# Patient Record
Sex: Female | Born: 1940 | Race: White | Hispanic: No | Marital: Married | Smoking: Former smoker
Health system: Southern US, Community
[De-identification: ages and names within clinical notes are randomized; demographics above are authoritative.]

## PROBLEM LIST (undated history)

## (undated) DIAGNOSIS — I499 Cardiac arrhythmia, unspecified: Secondary | ICD-10-CM

## (undated) DIAGNOSIS — I10 Essential (primary) hypertension: Secondary | ICD-10-CM

## (undated) DIAGNOSIS — I2510 Atherosclerotic heart disease of native coronary artery without angina pectoris: Secondary | ICD-10-CM

## (undated) DIAGNOSIS — E876 Hypokalemia: Secondary | ICD-10-CM

## (undated) DIAGNOSIS — I639 Cerebral infarction, unspecified: Secondary | ICD-10-CM

## (undated) DIAGNOSIS — I219 Acute myocardial infarction, unspecified: Secondary | ICD-10-CM

## (undated) DIAGNOSIS — E871 Hypo-osmolality and hyponatremia: Secondary | ICD-10-CM

## (undated) DIAGNOSIS — I609 Nontraumatic subarachnoid hemorrhage, unspecified: Secondary | ICD-10-CM

## (undated) HISTORY — DX: Hypokalemia: E87.6

## (undated) HISTORY — DX: Hypo-osmolality and hyponatremia: E87.1

## (undated) HISTORY — DX: Cerebral infarction, unspecified: I63.9

## (undated) HISTORY — DX: Nontraumatic subarachnoid hemorrhage, unspecified: I60.9

---

## 1999-07-09 ENCOUNTER — Encounter: Admission: RE | Admit: 1999-07-09 | Discharge: 1999-07-09 | Payer: Self-pay | Admitting: Emergency Medicine

## 1999-07-09 ENCOUNTER — Encounter: Payer: Self-pay | Admitting: Emergency Medicine

## 1999-11-13 ENCOUNTER — Encounter (INDEPENDENT_AMBULATORY_CARE_PROVIDER_SITE_OTHER): Payer: Self-pay

## 1999-11-13 ENCOUNTER — Inpatient Hospital Stay (HOSPITAL_COMMUNITY): Admission: RE | Admit: 1999-11-13 | Discharge: 1999-11-15 | Payer: Self-pay | Admitting: Obstetrics and Gynecology

## 2000-06-19 ENCOUNTER — Emergency Department (HOSPITAL_COMMUNITY): Admission: EM | Admit: 2000-06-19 | Discharge: 2000-06-19 | Payer: Self-pay | Admitting: Emergency Medicine

## 2000-06-19 ENCOUNTER — Encounter: Payer: Self-pay | Admitting: Emergency Medicine

## 2000-11-18 ENCOUNTER — Encounter: Admission: RE | Admit: 2000-11-18 | Discharge: 2000-11-18 | Payer: Self-pay | Admitting: Emergency Medicine

## 2000-11-18 ENCOUNTER — Encounter: Payer: Self-pay | Admitting: Emergency Medicine

## 2001-10-26 ENCOUNTER — Ambulatory Visit (HOSPITAL_COMMUNITY): Admission: RE | Admit: 2001-10-26 | Discharge: 2001-10-26 | Payer: Self-pay | Admitting: Gastroenterology

## 2001-10-26 ENCOUNTER — Encounter (INDEPENDENT_AMBULATORY_CARE_PROVIDER_SITE_OTHER): Payer: Self-pay | Admitting: Specialist

## 2001-12-09 ENCOUNTER — Encounter: Payer: Self-pay | Admitting: Emergency Medicine

## 2001-12-09 ENCOUNTER — Encounter: Admission: RE | Admit: 2001-12-09 | Discharge: 2001-12-09 | Payer: Self-pay | Admitting: Emergency Medicine

## 2002-07-18 ENCOUNTER — Emergency Department (HOSPITAL_COMMUNITY): Admission: EM | Admit: 2002-07-18 | Discharge: 2002-07-18 | Payer: Self-pay | Admitting: Emergency Medicine

## 2002-09-30 ENCOUNTER — Encounter: Admission: RE | Admit: 2002-09-30 | Discharge: 2002-09-30 | Payer: Self-pay | Admitting: Emergency Medicine

## 2002-09-30 ENCOUNTER — Encounter: Payer: Self-pay | Admitting: Emergency Medicine

## 2003-04-07 ENCOUNTER — Encounter: Admission: RE | Admit: 2003-04-07 | Discharge: 2003-04-07 | Payer: Self-pay | Admitting: Emergency Medicine

## 2003-04-07 ENCOUNTER — Encounter: Payer: Self-pay | Admitting: Emergency Medicine

## 2003-05-04 ENCOUNTER — Encounter: Admission: RE | Admit: 2003-05-04 | Discharge: 2003-05-04 | Payer: Self-pay | Admitting: Neurosurgery

## 2003-05-24 ENCOUNTER — Encounter: Admission: RE | Admit: 2003-05-24 | Discharge: 2003-05-24 | Payer: Self-pay | Admitting: Neurosurgery

## 2003-07-13 ENCOUNTER — Encounter: Admission: RE | Admit: 2003-07-13 | Discharge: 2003-10-11 | Payer: Self-pay | Admitting: Neurosurgery

## 2004-10-04 ENCOUNTER — Encounter: Admission: RE | Admit: 2004-10-04 | Discharge: 2004-10-04 | Payer: Self-pay | Admitting: Emergency Medicine

## 2004-10-09 ENCOUNTER — Emergency Department (HOSPITAL_COMMUNITY): Admission: EM | Admit: 2004-10-09 | Discharge: 2004-10-09 | Payer: Self-pay | Admitting: Family Medicine

## 2005-02-10 ENCOUNTER — Ambulatory Visit (HOSPITAL_COMMUNITY): Admission: RE | Admit: 2005-02-10 | Discharge: 2005-02-10 | Payer: Self-pay | Admitting: Gastroenterology

## 2005-05-16 ENCOUNTER — Encounter (INDEPENDENT_AMBULATORY_CARE_PROVIDER_SITE_OTHER): Payer: Self-pay | Admitting: Specialist

## 2005-05-16 ENCOUNTER — Ambulatory Visit (HOSPITAL_BASED_OUTPATIENT_CLINIC_OR_DEPARTMENT_OTHER): Admission: RE | Admit: 2005-05-16 | Discharge: 2005-05-16 | Payer: Self-pay | Admitting: General Surgery

## 2005-05-16 ENCOUNTER — Ambulatory Visit (HOSPITAL_COMMUNITY): Admission: RE | Admit: 2005-05-16 | Discharge: 2005-05-16 | Payer: Self-pay | Admitting: General Surgery

## 2005-10-06 ENCOUNTER — Encounter: Admission: RE | Admit: 2005-10-06 | Discharge: 2005-10-06 | Payer: Self-pay | Admitting: Emergency Medicine

## 2006-10-12 ENCOUNTER — Encounter: Admission: RE | Admit: 2006-10-12 | Discharge: 2006-10-12 | Payer: Self-pay | Admitting: Emergency Medicine

## 2007-01-04 ENCOUNTER — Encounter: Admission: RE | Admit: 2007-01-04 | Discharge: 2007-01-04 | Payer: Self-pay | Admitting: Emergency Medicine

## 2007-07-29 ENCOUNTER — Ambulatory Visit (HOSPITAL_BASED_OUTPATIENT_CLINIC_OR_DEPARTMENT_OTHER): Admission: RE | Admit: 2007-07-29 | Discharge: 2007-07-29 | Payer: Self-pay | Admitting: Orthopedic Surgery

## 2007-08-26 ENCOUNTER — Ambulatory Visit (HOSPITAL_BASED_OUTPATIENT_CLINIC_OR_DEPARTMENT_OTHER): Admission: RE | Admit: 2007-08-26 | Discharge: 2007-08-26 | Payer: Self-pay | Admitting: Orthopedic Surgery

## 2007-10-13 ENCOUNTER — Encounter: Admission: RE | Admit: 2007-10-13 | Discharge: 2007-10-13 | Payer: Self-pay | Admitting: Emergency Medicine

## 2008-10-11 ENCOUNTER — Inpatient Hospital Stay (HOSPITAL_COMMUNITY): Admission: RE | Admit: 2008-10-11 | Discharge: 2008-10-13 | Payer: Self-pay | Admitting: Orthopedic Surgery

## 2009-01-02 ENCOUNTER — Encounter: Admission: RE | Admit: 2009-01-02 | Discharge: 2009-01-02 | Payer: Self-pay | Admitting: Family Medicine

## 2009-01-24 ENCOUNTER — Emergency Department (HOSPITAL_COMMUNITY): Admission: EM | Admit: 2009-01-24 | Discharge: 2009-01-24 | Payer: Self-pay | Admitting: Emergency Medicine

## 2009-09-28 ENCOUNTER — Encounter: Admission: RE | Admit: 2009-09-28 | Discharge: 2009-09-28 | Payer: Self-pay | Admitting: Family Medicine

## 2010-01-11 ENCOUNTER — Encounter: Admission: RE | Admit: 2010-01-11 | Discharge: 2010-01-11 | Payer: Self-pay | Admitting: Family Medicine

## 2010-09-25 LAB — URINALYSIS, ROUTINE W REFLEX MICROSCOPIC
Glucose, UA: NEGATIVE mg/dL
Hgb urine dipstick: NEGATIVE
Protein, ur: NEGATIVE mg/dL
pH: 6 (ref 5.0–8.0)

## 2010-09-25 LAB — COMPREHENSIVE METABOLIC PANEL
ALT: 48 U/L — ABNORMAL HIGH (ref 0–35)
Albumin: 3.8 g/dL (ref 3.5–5.2)
Alkaline Phosphatase: 85 U/L (ref 39–117)
BUN: 18 mg/dL (ref 6–23)
Chloride: 105 mEq/L (ref 96–112)
Glucose, Bld: 72 mg/dL (ref 70–99)
Potassium: 3.7 mEq/L (ref 3.5–5.1)
Total Bilirubin: 0.9 mg/dL (ref 0.3–1.2)

## 2010-09-25 LAB — BASIC METABOLIC PANEL
BUN: 10 mg/dL (ref 6–23)
BUN: 14 mg/dL (ref 6–23)
CO2: 30 mEq/L (ref 19–32)
Calcium: 8.4 mg/dL (ref 8.4–10.5)
Calcium: 8.5 mg/dL (ref 8.4–10.5)
Creatinine, Ser: 0.7 mg/dL (ref 0.4–1.2)
Creatinine, Ser: 0.75 mg/dL (ref 0.4–1.2)
GFR calc non Af Amer: >60 mL/min (ref >60)
GFR calc non Af Amer: >60 mL/min (ref >60)
Glucose, Bld: 151 mg/dL — ABNORMAL HIGH (ref 70–99)
Sodium: 137 mEq/L (ref 135–145)

## 2010-09-25 LAB — PROTIME-INR
INR: 1.2 (ref 0.00–1.49)
Prothrombin Time: 12.8 seconds (ref 11.6–15.2)
Prothrombin Time: 15.3 seconds — ABNORMAL HIGH (ref 11.6–15.2)
Prothrombin Time: 17.5 seconds — ABNORMAL HIGH (ref 11.6–15.2)

## 2010-09-25 LAB — CBC
HCT: 31.2 % — ABNORMAL LOW (ref 36.0–46.0)
Hemoglobin: 14.2 g/dL (ref 12.0–15.0)
MCHC: 34.5 g/dL (ref 30.0–36.0)
MCV: 90.2 fL (ref 78.0–100.0)
Platelets: 205 10*3/uL (ref 150–400)
Platelets: 212 10*3/uL (ref 150–400)
RBC: 3.51 MIL/uL — ABNORMAL LOW (ref 3.87–5.11)
RBC: 4.61 MIL/uL (ref 3.87–5.11)
RDW: 12.8 % (ref 11.5–15.5)
RDW: 12.9 % (ref 11.5–15.5)
WBC: 6.7 10*3/uL (ref 4.0–10.5)
WBC: 7.6 10*3/uL (ref 4.0–10.5)

## 2010-09-25 LAB — URINE MICROSCOPIC-ADD ON

## 2010-09-25 LAB — TYPE AND SCREEN
ABO/RH(D): O POS
Antibody Screen: NEGATIVE

## 2010-10-29 NOTE — Op Note (Signed)
Tammy Navarro, Tammy Navarro             ACCOUNT NO.:  [id number]   MEDICAL RECORD NO.:  [id number]          PATIENT TYPE:  INP   LOCATION:  5008                         FACILITY:  [facility]   PHYSICIAN:  Loreta Ave, M.D. DATE OF BIRTH:  13-Jan-1941   DATE OF PROCEDURE:  10/11/2008  DATE OF DISCHARGE:                               OPERATIVE REPORT   PREOPERATIVE DIAGNOSES:  End-stage degenerative arthritis left knee.  Varus alignment.  Bone deficiency lateral femoral condyle.   POSTOPERATIVE DIAGNOSES:  End-stage degenerative arthritis left knee.  Varus alignment.  Bone deficiency lateral femoral condyle.   PROCEDURE:  Left total knee replacement modified minimally invasive  approach.  Stryker Triathlon prosthesis.  Soft tissue balancing with  lateral capsule release.  Cemented pegged posterior stabilized #5  femoral component.  Cemented #5 tibial component 9-mm polyethylene  insert.  Cemented pegged medial offset resurfacing 35-mm patellar  component.   SURGEON:  Loreta Ave, MD   ASSISTANT:  Zonia Kief, PA was present throughout the entire case  necessary for timely completion of the procedure.   ANESTHESIA:  General.   BLOOD LOSS:  Minimal.   TOURNIQUET TIME:  One hour and 15 minutes.   SPECIMENS:  None.   CULTURES:  None.   COMPLICATIONS:  None.   DRESSINGS:  Soft compressive with knee immobilizer.   DRAIN:  Hemovac x1.   PROCEDURE:  The patient was brought to the operating room and after  adequate anesthesia have been obtained, the knee examined.  Valgus about  10 degrees a little bit correctable.  Almost full extension and flexion  at 100 degrees.  Stable ligaments.  Tourniquet applied, prepped and  draped in the usual sterile fashion.  Exsanguinated with elevation of  Esmarch, tourniquet inflated to 350 mmHg.  Straight incision above the  patella down to tibial tubercle.  Medial arthrotomy extending into a  vastus splitting incision preserving quad tendon.   Knee exposed.  Grade  4 change throughout.  Bony deficiency especially distal and lateral  femoral condyle.  Remnants of menisci and cruciate ligaments one large  and numerous small loose bodies removed.  Intramedullary guide on the  femur.  Distal cut 10 mm set at 5 degrees of valgus.  Using epicondylar  axis and removal of spurs femur was sized, cut and fitted for a  posterior stabilized #5 femoral component.  Proximal tibia exposed.  Extramedullary guide.  3 degree posterior slope cut.  Sufficient  resection for a 9-mm insert.  Size #5 component.  Lateral capsule  released to be able to balance the knee.  I did not release the  iliotibial band or the lateral retinaculum.  Patella exposed.  Size cut  and fitted for a 35-mm component.  Trials put in place #5 above and  below.  With a 9-mm insert and a 35-mm patellar component and soft  tissue balancing, full extension, full flexion, good alignment, good  stability and good patellofemoral tracking.  Tibia was marked for  appropriate rotation with trials and then hand reamed.  All trials have  been removed.  Copious irrigation with a pulse irrigating  device.  Cement prepared and placed on all components which were firmly seated.  Polyethylene attached to the tibia.  Knee reduced.  Patella held with a  clamp.  Once the cement hardened it was reexamined.  Had good alignment,  stability and tracking.  Wound irrigated.  Hemovac placed through a  separate stab wound and brought out superolaterally.  Arthrotomy closed  with #1 Vicryl, skin and subcutaneous tissue with Vicryl and staples.  Knee injected with Marcaine.  Hemovac clamp.  Sterile compressive  dressing applied.  Tourniquet deflated and removed.  Knee immobilizer  applied.  Anesthesia reversed.  Brought to the recovery room.  Tolerated  the surgery well.  No complications.      Loreta Ave, M.D.  Electronically Signed     DFM/MEDQ  D:  10/12/2008  T:  10/13/2008  Job:   [id number]

## 2010-10-29 NOTE — Op Note (Signed)
Tammy Navarro, Tammy Navarro             ACCOUNT NO.:  [id number]   MEDICAL RECORD NO.:  [id number]          PATIENT TYPE:  AMB   LOCATION:  DSC                          FACILITY:  [facility]   PHYSICIAN:  Loreta Ave, M.D. DATE OF BIRTH:  03-Apr-1941   DATE OF PROCEDURE:  08/26/2007  DATE OF DISCHARGE:                               OPERATIVE REPORT   PREOPERATIVE DIAGNOSIS:  Left carpal tunnel syndrome.   POSTOPERATIVE DIAGNOSIS:  Left carpal tunnel syndrome.   PROCEDURE:  Left carpal tunnel release.   SURGEON:  Loreta Ave, M.D.   ANESTHESIA:  General.   BLOOD LOSS:  Minimal.   TOURNIQUET TIME:  35 minutes.   SPECIMENS:  None.   COMPLICATIONS:  None.   DRESSING:  Soft compressive with a bulky hand dressing and splint.   DESCRIPTION OF PROCEDURE:  The patient was brought to the operating room  and placed on the operating table in a supine position.  After adequate  anesthesia had been obtained, tourniquet applied, prepped and draped in  the usual sterile fashion.  Exsanguinated with elevation of Esmarch and  tourniquet inflated to 250 mmHg.  A small incision over the volar aspect  of the wrist curving slightly ulnarward at the wrist crease.  The skin  and subcutaneous tissues divided.  Retinaculum over the carpal tunnel  identified and incised in its entirety under direct visualization from  the forearm fascia proximally to the palmar arch distally.  Moderate to  marked constriction on the nerve.  Improved after carpal tunnel release  aponeurotomy.  Distal wrist palmar branch identified, protected, and  decompressed.  Wound irrigated.  Skin closed with mattress nylon suture.  Margins were injected with Marcaine.  Sterile compressive dressing and a  splint applied.  Tourniquet deflated and removed.  Anesthesia reversed.  Brought to the recovery room. Tolerated surgery with no complications.      Loreta Ave, M.D.  Electronically Signed     DFM/MEDQ  D:   08/26/2007  T:  08/26/2007  Job:  [id number]

## 2010-10-29 NOTE — Op Note (Signed)
NAMELEATHER, Tammy Navarro             ACCOUNT NO.:  [id number]   MEDICAL RECORD NO.:  [id number]          PATIENT TYPE:  AMB   LOCATION:  DSC                          FACILITY:  [facility]   PHYSICIAN:  Loreta Ave, M.D. DATE OF BIRTH:  [birth year]   DATE OF PROCEDURE:  07/29/2007  DATE OF DISCHARGE:  07/29/2007                               OPERATIVE REPORT   PREOPERATIVE DIAGNOSIS:  Right carpal tunnel syndrome.   POSTOPERATIVE DIAGNOSIS:  Right carpal tunnel syndrome.   PROCEDURE:  Right carpal tunnel release.   SURGEON:  Loreta Ave, M.D.   ASSISTANT:  Zonia Kief, P.A.   ANESTHESIA:  General.   BLOOD LOSS:  Minimal.   SPECIMENS:  None.   CULTURES:  None.   COMPLICATIONS:  None.   PROCEDURE:  Soft compressive bulky dressing splint.   PROCEDURE:  The patient brought to the operating room, and after  adequate anesthesia had been obtained, tourniquet applied, prepped and  draped in the usual sterile fashion.  Exsanguination with elevation of  Esmarch, tourniquet inflated to 250 mmHg.  Total tourniquet time was 30  minutes.  A small incision made over the volar aspect of the carpal  tunnel.  Skin and subcutaneous tissues divided.  Retinaculum over the  carpal tunnel carefully divided from the forearm fascia proximally to  the palmar arch distally under direct visualization.  Moderate to marked  hourglass constriction of the nerve improved after carpal tunnel release  and aponeurotomy but was still present.  There were two motor branches,  one more proximal than usual, the other one more distal in the usual  position.  Both were identified, decompressed.  Digital branch, motor  branches identified, decompressed.  Entire  carpal tunnel inspected.  No other findings appreciated.  Wound  irrigated.  Skin closed with interrupted mattress sutures.  Sterile  compressive dressing applied.  Splint applied.  Tourniquet deflated and  removed.  Anesthesia reversed.  Brought to  the recovery room.  Tolerated  the surgery well.  No complications.      Loreta Ave, M.D.  Electronically Signed     DFM/MEDQ  D:  07/29/2007  T:  07/30/2007  Job:  [id number]

## 2010-11-01 NOTE — Op Note (Signed)
[facility]  Patient:    Tammy Navarro, Tammy Navarro                    MRN: [id number] Proc. Date: 11/13/99 Adm. Date:  [id number] Attending:  Cordelia Pen Ii                           Operative Report  PREOPERATIVE DIAGNOSIS:       Pelvic relaxation.  POSTOPERATIVE DIAGNOSIS:      Pelvic relaxation.  OPERATION:                    Total vaginal hysterectomy with anterior and posterior vaginal repair.  SURGEON:                      Guy Sandifer. Arleta Creek, M.D.  ASSISTANTWilley Blade, M.D.  ANESTHESIA:                   General with endotracheal intubation by J. Leilani Able, Montez Hageman., M.D.  ESTIMATED BLOOD LOSS:         350 cc.  INDICATIONS:                  This patient is a 70 year old married white female, gravida 4, para 4, with increasing symptoms of pelvic prolapse.  Details are dictated in the history and physical.  Total vaginal hysterectomy, bilateral salpingo-oophorectomy if possible, and anterior and posterior vaginal repair are discussed with the patient.  Possible risks and complications are discussed including, but not limited to infection, bowel, bladder, or ureteral damage, bleeding requiring transfusion of blood products with possible transfusion reaction, HIV and hepatitis acquisition, DVT, PE, pneumonia, fistula formation, and the possibility of laparotomy.  All questions were answered, and consent is signed and on the chart.  DESCRIPTION OF PROCEDURE:     The patient is taken to the operating room and placed in the dorsal supine position where general anesthesia is induced via endotracheal intubation.  She is then placed in the dorsal lithotomy position with care being taken not to overextend her hips.  She was prepped, the bladder was straight catheterized, and she is draped in a sterile fashion.  The weighted speculum was placed and the posterior cul-de-sac is entered sharply without  difficulty. There were multiple filmy adhesions in the posterior cul-de-sac which come down easily with the dissecting finger.  The cul-de-sac can then be directly observed and is seen to be totally free and clear.  The cervix is then circumscribed with a scalpel and the mucosa is advanced sharply and bluntly.  The uterosacral ligaments are taken bilaterally and are ligated with 0 Monocryl sutures with a transfixion type suture.  Cardinal ligaments are then taken bilaterally followed by the bladder pillars.  At this point, a hand can be placed around the fundus to mark the vesicouterine peritoneum which is bluntly perforated without difficulty.  The fundus is then delivered posteriorly and the proximal ligaments are clamped and the specimen is cut free.  Careful inspection reveals the ovaries to be adherent up  high in the pelvis.  Because they are so densely adherent, it is decided to leave them in place because they cannot be adequately exposed and because of the risk of bleeding.  The uterosacral ligaments are then plicated to  the vaginal mucosa bilaterally.  The uterosacral ligaments are then plicated with two separate sutures of 0 Monocryl in the midline.  The cuff is then closed posteriorly with figure-of-eight sutures.  Anterior vaginal repair is then carried out by dissecting the anterior vaginal mucosa from the underlying bladder sharply in the midline.  This is then carried bilaterally sharply and bluntly to a point approximately 1 cm below the urethral meatus.  Three Kelly plication sutures are then placed.  The  base of the bladder is then reduced with a pursestring suture.  The vesicovaginal fascia is then reapproximated in the midline with interrupted sutures which gives good support.  Excess vaginal mucosa is trimmed.  The vaginal cuff is then closed with interrupted figure-of-eight sutures.  The anterior vaginal mucosa is closed in a running fashion with 2-0  running locking suture of 0 Monocryl.  Posterior vaginal repair is then carried out by removing a diamond-shaped wedge of mucosa from the posterior forchette.  The posterior mucosa is then sharply dissected in the midline from the underlying rectum.  This is then carried out bilaterally sharply and bluntly.  The rectovaginal fascia is then reapproximated in the midline with interrupted sutures.  Excess mucosa is trimmed and the posterior mucosa is closed in a running locking fashion with 2-0 Monocryl.  The posterior perineal body is  then dissected out and interrupted sutures of 0 Monocryl are used to do a perineorrhaphy and to reapproximate the perineal body.  The running suture on the posterior vaginal mucosa is then carried on down in an episiotomy type fashion o close up the entire incision.  Foley catheter is placed in the bladder and approximately 100 cc of clear urine is obtained.  This is then filled retrograde through the Foley with approximately 420 cc of saline.  A suprapubic Bonanno catheter is placed on the first attempt without difficulty.  Good aspiration of  saline is noted with and without a stylet in place.  The bladder is drained while the Bonanno catheter is sutured in place with Ethilon suture.  The vagina is then packed with a one-inch Iodoform gauze.  All counts are correct.  The patient is  awakened and taken to the recovery room in stable condition. DD:  11/13/99 TD:  11/13/99 Job: [id number]

## 2010-11-01 NOTE — Discharge Summary (Signed)
[facility]  Patient:    Tammy Navarro, Tammy Navarro                    MRN: [id number] Adm. Date:  [id number] Disc. Date: [id number] Attending:  Cordelia Pen Ii                           Discharge Summary  ADMISSION DIAGNOSIS:          Pelvic prolapse.  DISCHARGE DIAGNOSIS:          Pelvic prolapse.  PROCEDURE:                    On Nov 13, 1999, total vaginal hysterectomy with anterior and posterior vaginal repair.  REASON FOR ADMISSION:         The patient is a 70 year old married white female, gravida 4, para 4, with progressive symptoms of prolapse and incontinence. Details are dictated in the history and physical.  HOSPITAL COURSE:              The patient undergoes the above procedure without  difficulty.  Estimated blood loss is 350 cc.  On the evening of surgery, she has good pain relief with mild nausea relieved with Phenergan.  She remains afebrile with stable vital signs with clear urine output.  The following morning, she does not pass flatus, but is ambulating well.  She remains afebrile.  Hemoglobin is 11.4.  Vaginal pack was removed on the first postoperative day.  On the day of discharge, she is feeling good, tolerating a regular diet, and ambulating. Residual urines are 0 to 25 cc.  Suprapubic catheter is removed intact without difficulty.  CONDITION ON DISCHARGE:       Good.  DIET:                         Regular as tolerated.  ACTIVITY:                     No lifting, no operation of automobiles, no vaginal entry.  She is to call for problems including, but not limited to, heavy vaginal bleeding, temperature of 100 degrees or greater, persistent nausea and vomiting, or increasing pain.  DISCHARGE MEDICATIONS:        1. Tylox #30 one to two p.o. q.6h. p.r.n.                               2. Ibuprofen 600 mg p.o. q.6h. p.r.n.                               3. Multivitamin daily.  FOLLOW-UP:                    In the  office in two to three weeks. DD:  11/15/99 TD:  11/19/99 Job: [id number]

## 2010-11-01 NOTE — Procedures (Signed)
[place]  Patient:    GUERLINE, HAPP Visit Number: [id number] MRN: [id number]          Service Type: END Location: ENDO Attending Physician:  Charna Elizabeth Dictated by:   Anselmo Rod, M.D. Proc. Date: 10/26/01 Admit Date:  10/26/2001   CC:         Reuben Likes, M.D.   Procedure Report  DATE OF BIRTH:  07/22/1940  PROCEDURE PERFORMED:  Colonoscopy with hot biopsies x3 and snare polypectomy x1.  ENDOSCOPIST:  Anselmo Rod, M.D.  INSTRUMENT USED:  Olympus colonoscope.  INDICATIONS FOR PROCEDURE:  Polyp seen on flexible sigmoidoscopy in a 70 year old white female, rule out other colonic polyps, polypectomy planned.  PREPROCEDURE PREPARATION:  Informed consent was procured from the patient. The patient was fasted for eight hours prior to the procedure, and prepped with a bottle of magnesium of citrate and a gallon of NuLytely the night prior to the procedure.  PREPROCEDURE PHYSICAL:  VITAL SIGNS:  The patient had stable vital signs.  NECK:  Supple.  CHEST:  Clear to auscultation.  CARDIAC:  S1 and S2 is regular.  ABDOMEN:  Soft with normal bowel sounds.  DESCRIPTION OF THE PROCEDURE:   The patient was placed in the left lateral decubitus position and sedated with 50 mg of Demerol and 7 mg of Versed intravenously.  Once the patient was adequately sedated and maintained on low-flow oxygen and continuous cardiac monitoring, the Olympus videocolonoscope was advanced from the rectum to the cecum without difficulty. The patient had some left-sided diverticulosis.  Two small polyps were hot biopsied from the rectosigmoid area.  One polyp was snared from 10 cm.  The patient tolerated the procedure well without complications.  The appendiceal orifice and the ileocecal valve were clearly visualized and photographed.  IMPRESSION: 1. Left-sided diverticulosis. 2. Three polyps in the rectum and rectosigmoid removed as described   above. 3. No masses or other abnormalities seen in the cecum, right colon, or    transverse colon.  RECOMMENDATIONS: 1. Await pathology results. 2. Outpatient followup in the next two weeks. 3. Avoid all nonsteroidals including aspirin for the next 2-3 weeks. 4. High fiber diet. 5. Further recommendations made on followup. Dictated by:   Anselmo Rod, M.D. Attending Physician:  Charna Elizabeth DD:  03/28/02 TD:  10/27/01 Job: [id number]

## 2010-11-01 NOTE — Op Note (Signed)
[name], [name]             ACCOUNT NO.:  [id number]   MEDICAL RECORD NO.:  [id number]          PATIENT TYPE:  AMB   LOCATION:  DSC                          FACILITY:  [facility]   PHYSICIAN:  Leonie Man, M.D.   DATE OF BIRTH:  [birth year]   DATE OF PROCEDURE:  05/16/2005  DATE OF DISCHARGE:                                 OPERATIVE REPORT   PREOPERATIVE DIAGNOSIS:  Cyst of right parietal scalp.   POSTOPERATIVE DIAGNOSIS:  Cyst of right parietal scalp.   PROCEDURE:  Excision of cyst from right parietal scalp.   SURGEON:  Leonie Man, M.D.   ASSISTANT:  OR tech.   ANESTHESIA:  MAC.   SPECIMENS TO LAB:  1.  Cyst, right parietal scalp anterior.  2.  Cyst, right parietal scalp posterior.   ESTIMATED BLOOD LOSS:  Minimal.   COMPLICATIONS:  There are no complications.   DISPOSITION:  The patient returned to the PACU in excellent condition.   BRIEF CLINICAL NOTE:  [name] is a 70 year old lady with an enlarging  cyst of her scalp. 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  She comes to the operating room for excision of these, as they have become  bigger and causing significant amount of discomfort, particularly when she  comes her hair. The risks and potential benefits of surgery have been  discussed. All questions answered, consent obtained.   PROCEDURE:  Following satisfactory sedation with the patient positioned  supinely, the head turned to the right lateral position. The larger of the 2  cysts was approached after prepping and draping, and infiltrated with 1%  lidocaine with epinephrine 1:100,000. I made an elliptical incision over the  larger of the 2 cysts, taking this through the skin and down to the cyst  capsule.  The cyst was then dissected free in its entirety and removed and  forwarded for pathologic evaluation. Hemostasis was secured with  electrocautery, and the wound was then closed with interrupted sutures of 3-  0 nylon.   Attention was then turned to the posterior cyst, which was also infiltrated  with 1% lidocaine with epinephrine 1:100,000. Again, an elliptical incision  was made over the incision; deepened through the skin and subcutaneous  tissues.  The excision was carried out with a wide excision around the  entire wall of the cyst, carrying it all the way down to the calvarium. This  is removed and forwarded for pathologic evaluation.  Hemostasis was assured  with  electrocautery.  The wound was then closed with interrupted 3-0 nylon  sutures. Both wounds were then reinforced with Dermabond. The patient was  then removed from the operating room to the recovery room in stable  condition. She tolerated the procedure well.      Leonie Man, M.D.  Electronically Signed     PB/MEDQ  D:  05/16/2005  T:  05/17/2005  Job:  [id number]

## 2010-11-01 NOTE — Op Note (Signed)
[name], [name]             ACCOUNT NO.:  [id number]   MEDICAL RECORD NO.:  [id number]          PATIENT TYPE:  AMB   LOCATION:  ENDO                         FACILITY:  [facility]   PHYSICIAN:  Anselmo Rod, M.D.  DATE OF BIRTH:  10/19/1940   DATE OF PROCEDURE:  02/10/2005  DATE OF DISCHARGE:                                 OPERATIVE REPORT   PROCEDURE PERFORMED:  Screening colonoscopy.   ENDOSCOPIST:  Tammy Navarro, M.D.   INSTRUMENT USED:  Olympus video colonoscope.   INDICATION FOR PROCEDURE:  A 70 year old white female with a history of  adenomatous polyps removed in the past undergoing a repeat colonoscopy to  rule out recurrent polyps, masses, etc.   PREPROCEDURE PREPARATION:  Informed consent was procured from the patient.  The patient fasted for 8 hours prior to the procedure and prepped with a  bottle of magnesium citrate and a gallon of Go-LYTELY the night prior to the  procedure.  The risks and benefits of the procedure, including a 10% miss  rate of cancer and polyps was discussed with her as well.   PREPROCEDURE PHYSICAL:  Patient had stable vital signs.  NECK:  Supple.  CHEST:  Clear to auscultation.  S1, S2 regular.  ABDOMEN:  Soft with normal bowel sounds.   DESCRIPTION OF THE PROCEDURE:  The patient was placed in the left lateral  decubitus position, sedated with 75 mg of Demerol and 7.5 mg of Versed in  slow incremental doses.  Once the patient was adequately sedated, maintained  on low flow oxygen, continuous cardiac monitoring.  The Olympus video  colonoscope was advanced from the rectum to the cecum.  The appendicular  orifice and ileocecal valve were visualized and photographed.  There was  some residual in the colon.  Multiple washings were done.  There was  evidence of sigmoid diverticulosis.  No masses, polyps, erosions or  ulcerations were seen.  Small internal hemorrhoids were appreciated on  retroflexion in the rectum.  The patient tolerated the  procedure well  without complications.   IMPRESSION:  1.  Scattered sigmoid diverticulosis.  2. Small internal hemorrhoids.  3. No      masses or polyps seen.   RECOMMENDATIONS:  1.  Continue on high fiber diet with liberal fluid intake.  2.  Brochures on diverticulosis given to the patient for education.  3.  Repeat colonoscopy in the next 5 years, unless the patient develops any      abnormal symptoms in the interim.  4.  Outpatient followup as need arises in the future.      Anselmo Rod, M.D.  Electronically Signed     JNM/MEDQ  D:  02/10/2005  T:  02/10/2005  Job:  [id number]   cc:   Reuben Likes, M.D.  [address]  [place]  [place] [address]  Fax: [phone]

## 2010-12-26 ENCOUNTER — Other Ambulatory Visit: Payer: Self-pay | Admitting: Internal Medicine

## 2010-12-26 DIAGNOSIS — Z1231 Encounter for screening mammogram for malignant neoplasm of breast: Secondary | ICD-10-CM

## 2011-01-14 ENCOUNTER — Ambulatory Visit
Admission: RE | Admit: 2011-01-14 | Discharge: 2011-01-14 | Disposition: A | Discharge status: Home / Self Care | Payer: Medicare Other | Source: Ambulatory Visit | Attending: Internal Medicine | Admitting: Internal Medicine

## 2011-01-14 DIAGNOSIS — Z1231 Encounter for screening mammogram for malignant neoplasm of breast: Secondary | ICD-10-CM

## 2011-03-07 LAB — BASIC METABOLIC PANEL
CO2: 27
GFR calc non Af Amer: >60
Glucose, Bld: 138 — ABNORMAL HIGH
Potassium: 4.2
Sodium: 138

## 2011-03-10 LAB — BASIC METABOLIC PANEL
BUN: 15
CO2: 27
Calcium: 9
Chloride: 107
Creatinine, Ser: 0.91
GFR calc Af Amer: >60
GFR calc non Af Amer: >60
Glucose, Bld: 99
Potassium: 3.9
Sodium: 139

## 2011-03-10 LAB — POCT HEMOGLOBIN-HEMACUE: Hemoglobin: 13.5

## 2011-09-25 ENCOUNTER — Emergency Department (HOSPITAL_COMMUNITY): Payer: Medicare Other

## 2011-09-25 ENCOUNTER — Emergency Department (HOSPITAL_COMMUNITY)
Admission: EM | Admit: 2011-09-25 | Discharge: 2011-09-25 | Disposition: A | Discharge status: Home / Self Care | Payer: Medicare Other | Attending: Emergency Medicine | Admitting: Emergency Medicine

## 2011-09-25 ENCOUNTER — Encounter (HOSPITAL_COMMUNITY): Payer: Self-pay | Admitting: Emergency Medicine

## 2011-09-25 DIAGNOSIS — K5792 Diverticulitis of intestine, part unspecified, without perforation or abscess without bleeding: Secondary | ICD-10-CM

## 2011-09-25 DIAGNOSIS — Z79899 Other long term (current) drug therapy: Secondary | ICD-10-CM | POA: Insufficient documentation

## 2011-09-25 DIAGNOSIS — R112 Nausea with vomiting, unspecified: Secondary | ICD-10-CM | POA: Insufficient documentation

## 2011-09-25 DIAGNOSIS — K5732 Diverticulitis of large intestine without perforation or abscess without bleeding: Secondary | ICD-10-CM | POA: Insufficient documentation

## 2011-09-25 HISTORY — DX: Essential (primary) hypertension: I10

## 2011-09-25 LAB — URINE MICROSCOPIC-ADD ON

## 2011-09-25 LAB — URINALYSIS, ROUTINE W REFLEX MICROSCOPIC
Glucose, UA: NEGATIVE mg/dL
Hgb urine dipstick: NEGATIVE
Specific Gravity, Urine: 1.025 (ref 1.005–1.030)
pH: 6 (ref 5.0–8.0)

## 2011-09-25 LAB — DIFFERENTIAL
Eosinophils Absolute: 0.1 10*3/uL (ref 0.0–0.7)
Lymphocytes Relative: 12 % (ref 12–46)
Lymphs Abs: 1.2 10*3/uL (ref 0.7–4.0)
Monocytes Relative: 4 % (ref 3–12)
Neutrophils Relative %: 83 % — ABNORMAL HIGH (ref 43–77)

## 2011-09-25 LAB — POCT I-STAT, CHEM 8
BUN: 29 mg/dL — ABNORMAL HIGH (ref 6–23)
Chloride: 104 mEq/L (ref 96–112)
Creatinine, Ser: 1 mg/dL (ref 0.50–1.10)
Potassium: 3.4 mEq/L — ABNORMAL LOW (ref 3.5–5.1)
Sodium: 143 mEq/L (ref 135–145)

## 2011-09-25 LAB — CBC
Hemoglobin: 14.8 g/dL (ref 12.0–15.0)
MCH: 30.2 pg (ref 26.0–34.0)
RBC: 4.9 MIL/uL (ref 3.87–5.11)
WBC: 10.3 10*3/uL (ref 4.0–10.5)

## 2011-09-25 MED ORDER — IOHEXOL 300 MG/ML  SOLN
20.0000 mL | INTRAMUSCULAR | Status: AC
  Administered 2011-09-25: 20 mL via ORAL

## 2011-09-25 MED ORDER — SODIUM CHLORIDE 0.9 % IV BOLUS (SEPSIS)
500.0000 mL | Freq: Once | INTRAVENOUS | Status: AC
  Administered 2011-09-25: 500 mL via INTRAVENOUS

## 2011-09-25 MED ORDER — IOHEXOL 300 MG/ML  SOLN
100.0000 mL | Freq: Once | INTRAMUSCULAR | Status: AC | PRN
  Administered 2011-09-25: 100 mL via INTRAVENOUS

## 2011-09-25 MED ORDER — SODIUM CHLORIDE 0.9 % IV SOLN: INTRAVENOUS | Status: DC

## 2011-09-25 MED ORDER — METRONIDAZOLE 500 MG PO TABS: 500.0000 mg | ORAL_TABLET | Freq: Three times a day (TID) | ORAL | Status: AC

## 2011-09-25 MED ORDER — HYDROCODONE-ACETAMINOPHEN 5-500 MG PO TABS: 1.0000 | ORAL_TABLET | Freq: Four times a day (QID) | ORAL | Status: AC | PRN

## 2011-09-25 MED ORDER — CIPROFLOXACIN HCL 500 MG PO TABS: 500.0000 mg | ORAL_TABLET | Freq: Two times a day (BID) | ORAL | Status: AC

## 2011-09-25 MED ORDER — ONDANSETRON HCL 4 MG/2ML IJ SOLN
4.0000 mg | Freq: Once | INTRAMUSCULAR | Status: AC
  Administered 2011-09-25: 4 mg via INTRAVENOUS
  Filled 2011-09-25: qty 2

## 2011-09-25 NOTE — ED Notes (Signed)
MD at bedside. 

## 2011-09-25 NOTE — ED Notes (Signed)
Patient with abdominal pain and vomiting since last night.  Patient's pain is located in left quadrants.

## 2011-09-25 NOTE — ED Provider Notes (Signed)
Care assumed from Dr. Rubin Payor at shift change.  I agree with his note, assessment, and plan.  The CT shows what appears to be diverticulitis.  The patient is feeling much better and will be discharged to home with antibiotics and pain meds.    Geoffery Lyons, MD 09/25/11 [phone]

## 2011-09-25 NOTE — ED Notes (Signed)
Pt discharged, iv removed by this rn, no documentation of when it was palced.

## 2011-09-25 NOTE — Discharge Instructions (Signed)
Abdominal Pain  Abdominal pain can be caused by many things. Your caregiver decides the seriousness of your pain by an examination and possibly blood tests and X-rays. Many cases can be observed and treated at home. Most abdominal pain is not caused by a disease and will probably improve without treatment. However, in many cases, more time must pass before a clear cause of the pain can be found. Before that point, it may not be known if you need more testing, or if hospitalization or surgery is needed.  HOME CARE INSTRUCTIONS   · Do not take laxatives unless directed by your caregiver.  · Take pain medicine only as directed by your caregiver.  · Only take over-the-counter or prescription medicines for pain, discomfort, or fever as directed by your caregiver.  · Try a clear liquid diet (broth, tea, or water) for as long as directed by your caregiver. Slowly move to a bland diet as tolerated.  SEEK IMMEDIATE MEDICAL CARE IF:   · The pain does not go away.  · You have a fever.  · You keep throwing up (vomiting).  · The pain is felt only in portions of the abdomen. Pain in the right side could possibly be appendicitis. In an adult, pain in the left lower portion of the abdomen could be colitis or diverticulitis.  · You pass bloody or black tarry stools.  MAKE SURE YOU:   · Understand these instructions.  · Will watch your condition.  · Will get help right away if you are not doing well or get worse.  Document Released: 03/12/2005 Document Revised: 05/22/2011 Document Reviewed: 01/19/2008  ExitCare® Patient Information ©2012 [employer].  Diverticulitis??  A diverticulum is a small pouch or sac on the colon. Diverticulosis is the presence of these diverticula on the colon. Diverticulitis is the irritation (inflammation) or infection of diverticula.  CAUSES   The colon and its diverticula contain bacteria. If food particles block the tiny opening to a diverticulum, the bacteria inside can grow and cause an increase in  pressure. This leads to infection and inflammation and is called diverticulitis.  SYMPTOMS   · Abdominal pain and tenderness. Usually, the pain is located on the left side of your abdomen. However, it could be located elsewhere.  · Fever.  · Bloating.  · Feeling sick to your stomach (nausea).  · Throwing up (vomiting).  · Abnormal stools.  DIAGNOSIS   Your caregiver will take a history and perform a physical exam. Since many things can cause abdominal pain, other tests may be necessary. Tests may include:  · Blood tests.  · Urine tests.  · X-ray of the abdomen.  · CT scan of the abdomen.  Sometimes, surgery is needed to determine if diverticulitis or other conditions are causing your symptoms.  TREATMENT   Most of the time, you can be treated without surgery. Treatment includes:  · Resting the bowels by only having liquids for a few days. As you improve, you will need to eat a low-fiber diet.  · Intravenous (IV) fluids if you are losing body fluids (dehydrated).  · Antibiotic medicines that treat infections may be given.  · Pain and nausea medicine, if needed.  · Surgery if the inflamed diverticulum has burst.  HOME CARE INSTRUCTIONS   · Try a clear liquid diet (broth, tea, or water for as long as directed by your caregiver). You may then gradually begin a low-fiber diet as tolerated. A low-fiber diet is a diet with less   than 10 grams of fiber. Choose the foods below to reduce fiber in the diet:  · White breads, cereals, rice, and pasta.  · Cooked fruits and vegetables or soft fresh fruits and vegetables without the skin.  · Ground or well-cooked tender beef, ham, veal, lamb, pork, or poultry.  · Eggs and seafood.  · After your diverticulitis symptoms have improved, your caregiver may put you on a high-fiber diet. A high-fiber diet includes 14 grams of fiber for every 1000 calories consumed. For a standard 2000 calorie diet, you would need 28 grams of fiber. Follow these diet guidelines to help you increase the  fiber in your diet. It is important to slowly increase the amount fiber in your diet to avoid gas, constipation, and bloating.  · Choose whole-grain breads, cereals, pasta, and brown rice.  · Choose fresh fruits and vegetables with the skin on. Do not overcook vegetables because the more vegetables are cooked, the more fiber is lost.  · Choose more nuts, seeds, legumes, dried peas, beans, and lentils.  · Look for food products that have greater than 3 grams of fiber per serving on the Nutrition Facts label.  · Take all medicine as directed by your caregiver.  · If your caregiver has given you a follow-up appointment, it is very important that you go. Not going could result in lasting (chronic) or permanent injury, pain, and disability. If there is any problem keeping the appointment, call to reschedule.  SEEK MEDICAL CARE IF:   · Your pain does not improve.  · You have a hard time advancing your diet beyond clear liquids.  · Your bowel movements do not return to normal.  SEEK IMMEDIATE MEDICAL CARE IF:   · Your pain becomes worse.  · You have an oral temperature above 102° F (38.9° C), not controlled by medicine.  · You have repeated vomiting.  · You have bloody or black, tarry stools.  · Symptoms that brought you to your caregiver become worse or are not getting better.  MAKE SURE YOU:   · Understand these instructions.  · Will watch your condition.  · Will get help right away if you are not doing well or get worse.  Document Released: 03/12/2005 Document Revised: 05/22/2011 Document Reviewed: 07/08/2010  ExitCare® Patient Information ©2012 [employer].

## 2011-09-25 NOTE — ED Provider Notes (Signed)
History     CSN: [id number]  Arrival date & time 09/25/11  0446   First MD Initiated Contact with Patient 09/25/11 [phone]      Chief Complaint  Patient presents with  . Emesis  . Abdominal Pain    (Consider location/radiation/quality/duration/timing/severity/associated sxs/prior treatment) Patient is a 71 y.o. female presenting with vomiting and abdominal pain. The history is provided by the patient.  Emesis  This is a new problem. Associated symptoms include abdominal pain. Pertinent negatives include no diarrhea and no headaches.  Abdominal Pain The primary symptoms of the illness include abdominal pain, nausea and vomiting. The primary symptoms of the illness do not include shortness of breath or diarrhea.  Symptoms associated with the illness do not include back pain.   patient has had abdominal pain with nausea and vomiting since last night. The pain is on the left side. No diarrhea. No fevers. Pain is constant. Described as dull. No dysuria. She's not had pain for this before. No sick contacts. She has not eaten since the pain began.  Past Medical History  Diagnosis Date  . Hypertension     History reviewed. No pertinent past surgical history.  History reviewed. No pertinent family history.  History  Substance Use Topics  . Smoking status: Never Smoker   . Smokeless tobacco: Not on file  . Alcohol Use: No    OB History    Grav Para Term Preterm Abortions TAB SAB Ect Mult Living                  Review of Systems  Constitutional: Negative for activity change and appetite change.  HENT: Negative for neck stiffness.   Eyes: Negative for pain.  Respiratory: Negative for chest tightness and shortness of breath.   Cardiovascular: Negative for chest pain and leg swelling.  Gastrointestinal: Positive for nausea, vomiting and abdominal pain. Negative for diarrhea.  Genitourinary: Negative for flank pain.  Musculoskeletal: Negative for back pain.  Skin: Negative for  rash.  Neurological: Negative for weakness, numbness and headaches.  Psychiatric/Behavioral: Negative for behavioral problems.    Allergies  Review of patient's allergies indicates no known allergies.  Home Medications   Current Outpatient Rx  Name Route Sig Dispense Refill  . AMLODIPINE BESYLATE 5 MG PO TABS Oral Take 5 mg by mouth daily.    . ATENOLOL 100 MG PO TABS Oral Take 100 mg by mouth daily.    Marland Kitchen LISINOPRIL-HYDROCHLOROTHIAZIDE 20-12.5 MG PO TABS Oral Take 1 tablet by mouth daily.    Marland Kitchen GERI-TONIC PO LIQD Oral Take 30 mLs by mouth daily.    Marland Kitchen OMEPRAZOLE 20 MG PO CPDR Oral Take 20 mg by mouth daily.    Marland Kitchen SIMVASTATIN 40 MG PO TABS Oral Take 40 mg by mouth every evening.      BP 103/61  Pulse 61  Temp(Src) 97.3 F (36.3 C) (Oral)  Resp 14  Ht 5\' 9"  (1.753 m)  Wt 195 lb (88.451 kg)  BMI 28.80 kg/m2  SpO2 97%  Physical Exam  Nursing note and vitals reviewed. Constitutional: She is oriented to person, place, and time. She appears well-developed and well-nourished.  HENT:  Head: Normocephalic and atraumatic.  Eyes: EOM are normal. Pupils are equal, round, and reactive to light.  Neck: Normal range of motion. Neck supple.  Cardiovascular: Normal rate, regular rhythm and normal heart sounds.   No murmur heard. Pulmonary/Chest: Effort normal and breath sounds normal. No respiratory distress. She has no wheezes. She has no  rales.  Abdominal: Soft. Bowel sounds are normal. She exhibits no distension. There is tenderness. There is no rebound and no guarding.       Tenderness to left lower abdomen without rebound or guarding.   Musculoskeletal: Normal range of motion.  Neurological: She is alert and oriented to person, place, and time. No cranial nerve deficit.  Skin: Skin is warm and dry.  Psychiatric: She has a normal mood and affect. Her speech is normal.    ED Course  Procedures (including critical care time)  Labs Reviewed  DIFFERENTIAL - Abnormal; Notable for the  following:    Neutrophils Relative 83 (*)    Neutro Abs 8.6 (*)    All other components within normal limits  POCT I-STAT, CHEM 8 - Abnormal; Notable for the following:    Potassium 3.4 (*)    BUN 29 (*)    Glucose, Bld 182 (*)    All other components within normal limits  CBC  URINALYSIS, ROUTINE W REFLEX MICROSCOPIC   No results found.   No diagnosis found.    MDM  Patient with nausea vomiting and left-sided abdominal pain. Lab work at this time is reassuring. She is pending a CT scan         [employer]. Rubin Payor, MD 09/25/11 [phone]

## 2011-09-25 NOTE — ED Notes (Signed)
Patient transported to CT 

## 2012-03-01 ENCOUNTER — Other Ambulatory Visit: Payer: Self-pay | Admitting: Internal Medicine

## 2012-03-01 DIAGNOSIS — Z1231 Encounter for screening mammogram for malignant neoplasm of breast: Secondary | ICD-10-CM

## 2012-03-08 ENCOUNTER — Ambulatory Visit
Admission: RE | Admit: 2012-03-08 | Discharge: 2012-03-08 | Disposition: A | Discharge status: Home / Self Care | Payer: Medicare Other | Source: Ambulatory Visit | Attending: Internal Medicine | Admitting: Internal Medicine

## 2012-03-08 DIAGNOSIS — Z1231 Encounter for screening mammogram for malignant neoplasm of breast: Secondary | ICD-10-CM

## 2012-11-06 ENCOUNTER — Ambulatory Visit (INDEPENDENT_AMBULATORY_CARE_PROVIDER_SITE_OTHER): Payer: Medicare Other | Admitting: Emergency Medicine

## 2012-11-06 VITALS — BP 135/79 | HR 90 | Temp 98.1°F | Resp 17 | Ht 67.0 in | Wt 213.0 lb

## 2012-11-06 DIAGNOSIS — A088 Other specified intestinal infections: Secondary | ICD-10-CM

## 2012-11-06 LAB — POCT CBC
Granulocyte percent: 72.2 %G (ref 37–80)
Hemoglobin: 14.1 g/dL (ref 12.2–16.2)
MCH, POC: 29.3 pg (ref 27–31.2)
MCV: 92.2 fL (ref 80–97)
MPV: 8.6 fL (ref 0–99.8)
Platelet Count, POC: 290 10*3/uL (ref 142–424)
RBC: 4.81 M/uL (ref 4.04–5.48)
WBC: 7.3 10*3/uL (ref 4.6–10.2)

## 2012-11-06 MED ORDER — ONDANSETRON 4 MG PO TBDP
8.0000 mg | ORAL_TABLET | Freq: Once | ORAL | Status: AC
  Administered 2012-11-06: 8 mg via ORAL

## 2012-11-06 MED ORDER — LOPERAMIDE HCL 2 MG PO TABS: 2.0000 mg | ORAL_TABLET | Freq: Four times a day (QID) | ORAL | Status: DC | PRN

## 2012-11-06 MED ORDER — ONDANSETRON 8 MG PO TBDP: 8.0000 mg | ORAL_TABLET | Freq: Three times a day (TID) | ORAL | Status: DC | PRN

## 2012-11-06 NOTE — Patient Instructions (Addendum)
Viral Gastroenteritis Viral gastroenteritis is also known as stomach flu. This condition affects the stomach and intestinal tract. It can cause sudden diarrhea and vomiting. The illness typically lasts 3 to 8 days. Most people develop an immune response that eventually gets rid of the virus. While this natural response develops, the virus can make you quite ill. CAUSES  Many different viruses can cause gastroenteritis, such as rotavirus or noroviruses. You can catch one of these viruses by consuming contaminated food or water. You may also catch a virus by sharing utensils or other personal items with an infected person or by touching a contaminated surface. SYMPTOMS  The most common symptoms are diarrhea and vomiting. These problems can cause a severe loss of body fluids (dehydration) and a body salt (electrolyte) imbalance. Other symptoms may include:  Fever.  Headache.  Fatigue.  Abdominal pain. DIAGNOSIS  Your caregiver can usually diagnose viral gastroenteritis based on your symptoms and a physical exam. A stool sample may also be taken to test for the presence of viruses or other infections. TREATMENT  This illness typically goes away on its own. Treatments are aimed at rehydration. The most serious cases of viral gastroenteritis involve vomiting so severely that you are not able to keep fluids down. In these cases, fluids must be given through an intravenous line (IV). HOME CARE INSTRUCTIONS   Drink enough fluids to keep your urine clear or pale yellow. Drink small amounts of fluids frequently and increase the amounts as tolerated.  Ask your caregiver for specific rehydration instructions.  Avoid:  Foods high in sugar.  Alcohol.  Carbonated drinks.  Tobacco.  Juice.  Caffeine drinks.  Extremely hot or cold fluids.  Fatty, greasy foods.  Too much intake of anything at one time.  Dairy products until 24 to 48 hours after diarrhea stops.  You may consume probiotics.  Probiotics are active cultures of beneficial bacteria. They may lessen the amount and number of diarrheal stools in adults. Probiotics can be found in yogurt with active cultures and in supplements.  Wash your hands well to avoid spreading the virus.  Only take over-the-counter or prescription medicines for pain, discomfort, or fever as directed by your caregiver. Do not give aspirin to children. Antidiarrheal medicines are not recommended.  Ask your caregiver if you should continue to take your regular prescribed and over-the-counter medicines.  Keep all follow-up appointments as directed by your caregiver. SEEK IMMEDIATE MEDICAL CARE IF:   You are unable to keep fluids down.  You do not urinate at least once every 6 to 8 hours.  You develop shortness of breath.  You notice blood in your stool or vomit. This may look like coffee grounds.  You have abdominal pain that increases or is concentrated in one small area (localized).  You have persistent vomiting or diarrhea.  You have a fever.  The patient is a child younger than 3 months, and he or she has a fever.  The patient is a child older than 3 months, and he or she has a fever and persistent symptoms.  The patient is a child older than 3 months, and he or she has a fever and symptoms suddenly get worse.  The patient is a baby, and he or she has no tears when crying. MAKE SURE YOU:   Understand these instructions.  Will watch your condition.  Will get help right away if you are not doing well or get worse. Document Released: 06/02/2005 Document Revised: 08/25/2011 Document Reviewed: 03/19/2011   ExitCare Patient Information 2014 [employer]. Clear Liquid Diet The clear liquid dietconsists of foods that are liquid or will become liquid at room temperature.You should be able to see through the liquid and beverages. Examples of foods allowed on a clear liquid diet include fruit juice, broth or bouillon, gelatin, or frozen  ice pops. The purpose of this diet is to provide necessary fluid, electrolytes such as sodium and potassium, and energy to keep the body functioning during times when you are not able to consume a regular diet.A clear liquid diet should not be continued for long periods of time as it is not nutritionally adequate.  REASONS FOR USING A CLEAR LIQUID DIET  In sudden onset (acute) conditions for a patient before or after surgery.  As the first step in oral feeding.  For fluid and electrolyte replacement in diarrheal diseases.  As a diet before certain medical tests are performed. ADEQUACY The clear liquid diet is adequate only in ascorbic acid, according to the Recommended Dietary Allowances of the National Research Council. CHOOSING FOODS Breads and Starches  Allowed:  None are allowed.  Avoid: All are avoided. Vegetables  Allowed:  Strained tomato or vegetable juice.  Avoid: Any others. Fruit  Allowed:  Strained fruit juices and fruit drinks. Include 1 serving of citrus or vitamin C-enriched fruit juice daily.  Avoid: Any others. Meat and Meat Substitutes  Allowed:  None are allowed.  Avoid: All are avoided. Milk  Allowed:  None are allowed.  Avoid: All are avoided. Soups and Combination Foods  Allowed:  Clear bouillon, broth, or strained broth-based soups.  Avoid: Any others. Desserts and Sweets  Allowed:  Sugar, honey. High protein gelatin. Flavored gelatin, ices, or frozen ice pops that do not contain milk.  Avoid: Any others. Fats and Oils  Allowed:  None are allowed.  Avoid: All are avoided. Beverages  Allowed: Cereal beverages, coffee (regular or decaffeinated), tea, or soda at the discretion of your caregiver.  Avoid: Any others. Condiments  Allowed:  Iodized salt.  Avoid: Any others, including pepper. Supplements  Allowed:  Liquid nutrition beverages.  Avoid: Any others that contain lactose or fiber. SAMPLE MEAL PLAN Breakfast  4 oz (120  mL) strained orange juice.   to 1 cup (125 to 250 mL) gelatin (plain or fortified).  1 cup (250 mL) beverage (coffee or tea).  Sugar, if desired. Midmorning Snack   cup (125 mL) gelatin (plain or fortified). Lunch  1 cup (250 mL) broth or consomm.  4 oz (120 mL) strained grapefruit juice.   cup (125 mL) gelatin (plain or fortified).  1 cup (250 mL) beverage (coffee or tea).  Sugar, if desired. Midafternoon Snack   cup (125 mL) fruit ice.   cup (125 mL) strained fruit juice. Dinner  1 cup (250 mL) broth or consomm.   cup (125 mL) cranberry juice.   cup (125 mL) flavored gelatin (plain or fortified).  1 cup (250 mL) beverage (coffee or tea).  Sugar, if desired. Evening Snack  4 oz (120 mL) strained apple juice (vitamin C-fortified).   cup (125 mL) flavored gelatin (plain or fortified). Document Released: 06/02/2005 Document Revised: 08/25/2011 Document Reviewed: 08/30/2010 ExitCare Patient Information 2014 [employer].  

## 2012-11-06 NOTE — Progress Notes (Signed)
[facility] [address], [place] [address] [phone]  Date:  11/06/2012   Name:  Tammy Navarro   DOB:  Oct 16, 1940   MRN:  [id number]  PCP:  No PCP Per Patient    Chief Complaint: Emesis, Diarrhea and Anorexia   History of Present Illness:  Tammy Navarro is a 72 y.o. very pleasant female patient who presents with the following:  Ate lunch out on Wednesday and on Thursday morning developed nausea and vomiting and on Friday diarrhea.  Says she cannot hold anything down and vomits every time she takes po.  No fever or chills.  The patient has no complaint of blood, mucous, or pus in her stools.   No ill contacts. No abdominal pain but large amounts of gas.  No improvement with over the counter medications or other home remedies. Denies other complaint or health concern today.   There are no active problems to display for this patient.   Past Medical History  Diagnosis Date  . Hypertension     History reviewed. No pertinent past surgical history.  History  Substance Use Topics  . Smoking status: Never Smoker   . Smokeless tobacco: Not on file  . Alcohol Use: No    History reviewed. No pertinent family history.  No Known Allergies  Medication list has been reviewed and updated.  Current Outpatient Prescriptions on File Prior to Visit  Medication Sig Dispense Refill  . amLODipine (NORVASC) 5 MG tablet Take 5 mg by mouth daily.      Marland Kitchen atenolol (TENORMIN) 100 MG tablet Take 100 mg by mouth daily.      Marland Kitchen lisinopril-hydrochlorothiazide (PRINZIDE,ZESTORETIC) 20-12.5 MG per tablet Take 1 tablet by mouth daily.      . multivitamin (GERI-TONIC) LIQD Take 30 mLs by mouth daily.      Marland Kitchen omeprazole (PRILOSEC) 20 MG capsule Take 20 mg by mouth daily.       No current facility-administered medications on file prior to visit.    Review of Systems:  As per HPI, otherwise negative.    Physical Examination: Filed Vitals:   11/06/12 1621  BP:  135/79  Pulse: 90  Temp: 98.1 F (36.7 C)  Resp: 17   Filed Vitals:   11/06/12 1621  Height: 5\' 7"  (1.702 m)  Weight: 213 lb (96.616 kg)   Body mass index is 33.35 kg/(m^2). Ideal Body Weight: Weight in (lb) to have BMI = 25: 159.3  GEN: WDWN, NAD, Non-toxic, A & O x 3 HEENT: Atraumatic, Normocephalic. Neck supple. No masses, No LAD. Ears and Nose: No external deformity. CV: RRR, No M/G/R. No JVD. No thrill. No extra heart sounds. PULM: CTA B, no wheezes, crackles, rhonchi. No retractions. No resp. distress. No accessory muscle use. ABD: S, NT, ND, +BS hyperactive. No rebound. No HSM. EXTR: No c/c/e NEURO Normal gait.  PSYCH: Normally interactive. Conversant. Not depressed or anxious appearing.  Calm demeanor.    Assessment and Plan: Gastroenteritis zofran Imodium Clears   Signed,  Phillips Odor, MD   Results for orders placed in visit on 11/06/12  POCT CBC      Result Value Range   WBC 7.3  4.6 - 10.2 K/uL   Lymph, poc 1.6  0.6 - 3.4   POC LYMPH PERCENT 22.1  10 - 50 %L   MID (cbc) 0.4  0 - 0.9   POC MID % 5.7  0 - 12 %M   POC Granulocyte 5.3  2 - 6.9   Granulocyte percent 72.2  37 - 80 %G   RBC 4.81  4.04 - 5.48 M/uL   Hemoglobin 14.1  12.2 - 16.2 g/dL   HCT, POC 16.1  09.6 - 47.9 %   MCV 92.2  80 - 97 fL   MCH, POC 29.3  27 - 31.2 pg   MCHC 31.8  31.8 - 35.4 g/dL   RDW, POC 04.5     Platelet Count, POC 290  142 - 424 K/uL   MPV 8.6  0 - 99.8 fL

## 2013-02-01 ENCOUNTER — Other Ambulatory Visit: Payer: Self-pay

## 2013-02-01 DIAGNOSIS — Z1231 Encounter for screening mammogram for malignant neoplasm of breast: Secondary | ICD-10-CM

## 2013-03-09 ENCOUNTER — Ambulatory Visit
Admission: RE | Admit: 2013-03-09 | Discharge: 2013-03-09 | Disposition: A | Discharge status: Home / Self Care | Payer: Medicare Other | Source: Ambulatory Visit

## 2013-03-09 DIAGNOSIS — Z1231 Encounter for screening mammogram for malignant neoplasm of breast: Secondary | ICD-10-CM

## 2014-03-01 ENCOUNTER — Other Ambulatory Visit: Payer: Self-pay

## 2014-03-01 DIAGNOSIS — Z1231 Encounter for screening mammogram for malignant neoplasm of breast: Secondary | ICD-10-CM

## 2014-03-14 ENCOUNTER — Ambulatory Visit
Admission: RE | Admit: 2014-03-14 | Discharge: 2014-03-14 | Disposition: A | Discharge status: Home / Self Care | Payer: Medicare Other | Source: Ambulatory Visit

## 2014-03-14 DIAGNOSIS — Z1231 Encounter for screening mammogram for malignant neoplasm of breast: Secondary | ICD-10-CM

## 2015-03-26 ENCOUNTER — Other Ambulatory Visit: Payer: Self-pay

## 2015-03-26 DIAGNOSIS — Z1231 Encounter for screening mammogram for malignant neoplasm of breast: Secondary | ICD-10-CM

## 2015-04-02 ENCOUNTER — Ambulatory Visit
Admission: RE | Admit: 2015-04-02 | Discharge: 2015-04-02 | Disposition: A | Discharge status: Home / Self Care | Payer: Medicare Other | Source: Ambulatory Visit

## 2015-04-02 DIAGNOSIS — Z1231 Encounter for screening mammogram for malignant neoplasm of breast: Secondary | ICD-10-CM

## 2016-06-17 ENCOUNTER — Other Ambulatory Visit: Payer: Self-pay | Admitting: Internal Medicine

## 2016-06-17 DIAGNOSIS — Z1231 Encounter for screening mammogram for malignant neoplasm of breast: Secondary | ICD-10-CM

## 2016-07-01 ENCOUNTER — Ambulatory Visit
Admission: RE | Admit: 2016-07-01 | Discharge: 2016-07-01 | Disposition: A | Discharge status: Home / Self Care | Payer: Medicare Other | Source: Ambulatory Visit | Attending: Internal Medicine | Admitting: Internal Medicine

## 2016-07-01 DIAGNOSIS — Z1231 Encounter for screening mammogram for malignant neoplasm of breast: Secondary | ICD-10-CM

## 2017-08-07 ENCOUNTER — Other Ambulatory Visit: Payer: Self-pay | Admitting: Internal Medicine

## 2017-08-07 DIAGNOSIS — Z139 Encounter for screening, unspecified: Secondary | ICD-10-CM

## 2017-08-26 ENCOUNTER — Ambulatory Visit
Admission: RE | Admit: 2017-08-26 | Discharge: 2017-08-26 | Disposition: A | Discharge status: Home / Self Care | Payer: Medicare Other | Source: Ambulatory Visit | Attending: Internal Medicine | Admitting: Internal Medicine

## 2017-08-26 DIAGNOSIS — Z139 Encounter for screening, unspecified: Secondary | ICD-10-CM

## 2018-10-24 ENCOUNTER — Inpatient Hospital Stay (HOSPITAL_COMMUNITY): Payer: Medicare Other

## 2018-10-24 ENCOUNTER — Emergency Department (HOSPITAL_COMMUNITY): Payer: Medicare Other

## 2018-10-24 ENCOUNTER — Other Ambulatory Visit: Payer: Self-pay

## 2018-10-24 ENCOUNTER — Inpatient Hospital Stay (HOSPITAL_COMMUNITY)
Admission: EM | Admit: 2018-10-24 | Discharge: 2018-11-01 | DRG: 082 | Disposition: A | Discharge status: Skilled Nursing Facility | Payer: Medicare Other | Attending: Family Medicine | Admitting: Family Medicine

## 2018-10-24 ENCOUNTER — Encounter (HOSPITAL_COMMUNITY): Payer: Self-pay | Admitting: Emergency Medicine

## 2018-10-24 DIAGNOSIS — G9341 Metabolic encephalopathy: Secondary | ICD-10-CM | POA: Diagnosis present

## 2018-10-24 DIAGNOSIS — S066X9A Traumatic subarachnoid hemorrhage with loss of consciousness of unspecified duration, initial encounter: Secondary | ICD-10-CM | POA: Diagnosis present

## 2018-10-24 DIAGNOSIS — S0181XA Laceration without foreign body of other part of head, initial encounter: Secondary | ICD-10-CM | POA: Diagnosis not present

## 2018-10-24 DIAGNOSIS — I4891 Unspecified atrial fibrillation: Secondary | ICD-10-CM | POA: Diagnosis not present

## 2018-10-24 DIAGNOSIS — E860 Dehydration: Secondary | ICD-10-CM | POA: Diagnosis present

## 2018-10-24 DIAGNOSIS — I609 Nontraumatic subarachnoid hemorrhage, unspecified: Secondary | ICD-10-CM

## 2018-10-24 DIAGNOSIS — R64 Cachexia: Secondary | ICD-10-CM | POA: Diagnosis present

## 2018-10-24 DIAGNOSIS — E785 Hyperlipidemia, unspecified: Secondary | ICD-10-CM | POA: Diagnosis present

## 2018-10-24 DIAGNOSIS — Z23 Encounter for immunization: Secondary | ICD-10-CM

## 2018-10-24 DIAGNOSIS — Z79899 Other long term (current) drug therapy: Secondary | ICD-10-CM | POA: Diagnosis not present

## 2018-10-24 DIAGNOSIS — F0390 Unspecified dementia without behavioral disturbance: Secondary | ICD-10-CM | POA: Diagnosis present

## 2018-10-24 DIAGNOSIS — E878 Other disorders of electrolyte and fluid balance, not elsewhere classified: Secondary | ICD-10-CM | POA: Diagnosis present

## 2018-10-24 DIAGNOSIS — S065X9A Traumatic subdural hemorrhage with loss of consciousness of unspecified duration, initial encounter: Secondary | ICD-10-CM | POA: Diagnosis present

## 2018-10-24 DIAGNOSIS — Z681 Body mass index (BMI) 19 or less, adult: Secondary | ICD-10-CM | POA: Diagnosis not present

## 2018-10-24 DIAGNOSIS — E875 Hyperkalemia: Secondary | ICD-10-CM | POA: Diagnosis present

## 2018-10-24 DIAGNOSIS — Y92009 Unspecified place in unspecified non-institutional (private) residence as the place of occurrence of the external cause: Secondary | ICD-10-CM

## 2018-10-24 DIAGNOSIS — Z9181 History of falling: Secondary | ICD-10-CM

## 2018-10-24 DIAGNOSIS — Z1159 Encounter for screening for other viral diseases: Secondary | ICD-10-CM

## 2018-10-24 DIAGNOSIS — R29706 NIHSS score 6: Secondary | ICD-10-CM | POA: Diagnosis present

## 2018-10-24 DIAGNOSIS — I10 Essential (primary) hypertension: Secondary | ICD-10-CM | POA: Diagnosis present

## 2018-10-24 DIAGNOSIS — E871 Hypo-osmolality and hyponatremia: Secondary | ICD-10-CM | POA: Diagnosis not present

## 2018-10-24 DIAGNOSIS — Z8249 Family history of ischemic heart disease and other diseases of the circulatory system: Secondary | ICD-10-CM

## 2018-10-24 DIAGNOSIS — E876 Hypokalemia: Secondary | ICD-10-CM | POA: Diagnosis present

## 2018-10-24 DIAGNOSIS — I6389 Other cerebral infarction: Secondary | ICD-10-CM | POA: Diagnosis present

## 2018-10-24 DIAGNOSIS — I639 Cerebral infarction, unspecified: Secondary | ICD-10-CM | POA: Diagnosis present

## 2018-10-24 DIAGNOSIS — W19XXXA Unspecified fall, initial encounter: Secondary | ICD-10-CM | POA: Diagnosis not present

## 2018-10-24 LAB — COMPREHENSIVE METABOLIC PANEL
ALT: 16 U/L (ref 0–44)
AST: 29 U/L (ref 15–41)
Albumin: 3.7 g/dL (ref 3.5–5.0)
Alkaline Phosphatase: 70 U/L (ref 38–126)
Anion gap: 18 — ABNORMAL HIGH (ref 5–15)
BUN: 15 mg/dL (ref 8–23)
CO2: 25 mmol/L (ref 22–32)
Calcium: 9.1 mg/dL (ref 8.9–10.3)
Chloride: 81 mmol/L — ABNORMAL LOW (ref 98–111)
Creatinine, Ser: 0.64 mg/dL (ref 0.44–1.00)
GFR calc Af Amer: >60 mL/min (ref >60)
GFR calc non Af Amer: >60 mL/min (ref >60)
Glucose, Bld: 97 mg/dL (ref 70–99)
Potassium: 2.8 mmol/L — ABNORMAL LOW (ref 3.5–5.1)
Sodium: 124 mmol/L — ABNORMAL LOW (ref 135–145)
Total Bilirubin: 1.1 mg/dL (ref 0.3–1.2)
Total Protein: 6.8 g/dL (ref 6.5–8.1)

## 2018-10-24 LAB — CBC WITH DIFFERENTIAL/PLATELET
Abs Immature Granulocytes: 0.02 10*3/uL (ref 0.00–0.07)
Basophils Absolute: 0 10*3/uL (ref 0.0–0.1)
Basophils Relative: 0 %
Eosinophils Absolute: 0.1 10*3/uL (ref 0.0–0.5)
Eosinophils Relative: 1 %
HCT: 39.3 % (ref 36.0–46.0)
Hemoglobin: 14.2 g/dL (ref 12.0–15.0)
Immature Granulocytes: 0 %
Lymphocytes Relative: 14 %
Lymphs Abs: 1 10*3/uL (ref 0.7–4.0)
MCH: 31.2 pg (ref 26.0–34.0)
MCHC: 36.1 g/dL — ABNORMAL HIGH (ref 30.0–36.0)
MCV: 86.4 fL (ref 80.0–100.0)
Monocytes Absolute: 0.7 10*3/uL (ref 0.1–1.0)
Monocytes Relative: 10 %
Neutro Abs: 5 10*3/uL (ref 1.7–7.7)
Neutrophils Relative %: 75 %
Platelets: 264 10*3/uL (ref 150–400)
RBC: 4.55 MIL/uL (ref 3.87–5.11)
RDW: 12.2 % (ref 11.5–15.5)
WBC: 6.7 10*3/uL (ref 4.0–10.5)
nRBC: 0 % (ref 0.0–0.2)

## 2018-10-24 LAB — URINALYSIS, ROUTINE W REFLEX MICROSCOPIC
Bilirubin Urine: NEGATIVE
Glucose, UA: NEGATIVE mg/dL
Ketones, ur: 5 mg/dL — AB
Nitrite: NEGATIVE
Protein, ur: NEGATIVE mg/dL
Specific Gravity, Urine: 1.008 (ref 1.005–1.030)
pH: 6 (ref 5.0–8.0)

## 2018-10-24 LAB — TROPONIN I: Troponin I: <0.03 ng/mL (ref <0.03)

## 2018-10-24 LAB — SARS CORONAVIRUS 2 BY RT PCR (HOSPITAL ORDER, PERFORMED IN ~~LOC~~ HOSPITAL LAB): SARS Coronavirus 2: NEGATIVE

## 2018-10-24 MED ORDER — POTASSIUM CHLORIDE 10 MEQ/100ML IV SOLN
10.0000 meq | INTRAVENOUS | Status: AC
  Administered 2018-10-24 (×3): 10 meq via INTRAVENOUS
  Filled 2018-10-24 (×3): qty 100

## 2018-10-24 MED ORDER — ONDANSETRON 4 MG PO TBDP: 8.0000 mg | ORAL_TABLET | Freq: Three times a day (TID) | ORAL | Status: DC | PRN

## 2018-10-24 MED ORDER — LOPERAMIDE HCL 2 MG PO CAPS: 2.0000 mg | ORAL_CAPSULE | Freq: Four times a day (QID) | ORAL | Status: DC | PRN

## 2018-10-24 MED ORDER — GERI-TONIC PO LIQD: 30.0000 mL | Freq: Every day | ORAL | Status: DC

## 2018-10-24 MED ORDER — SODIUM CHLORIDE 0.9 % IV BOLUS
500.0000 mL | Freq: Once | INTRAVENOUS | Status: AC
  Administered 2018-10-24: 500 mL via INTRAVENOUS

## 2018-10-24 MED ORDER — LORAZEPAM 2 MG/ML IJ SOLN
1.0000 mg | Freq: Once | INTRAMUSCULAR | Status: AC
  Administered 2018-10-24: 1 mg via INTRAVENOUS
  Filled 2018-10-24 (×2): qty 1

## 2018-10-24 MED ORDER — ATENOLOL 100 MG PO TABS
100.0000 mg | ORAL_TABLET | Freq: Every day | ORAL | Status: DC
  Administered 2018-10-25 – 2018-11-01 (×9): 100 mg via ORAL
  Filled 2018-10-24 (×9): qty 1

## 2018-10-24 MED ORDER — LIDOCAINE-EPINEPHRINE (PF) 2 %-1:200000 IJ SOLN
10.0000 mL | Freq: Once | INTRAMUSCULAR | Status: AC
  Administered 2018-10-24: 10 mL via INTRADERMAL
  Filled 2018-10-24: qty 20

## 2018-10-24 MED ORDER — ONDANSETRON HCL 4 MG/2ML IJ SOLN: 4.0000 mg | Freq: Four times a day (QID) | INTRAMUSCULAR | Status: DC | PRN

## 2018-10-24 MED ORDER — SODIUM CHLORIDE 0.9 % IV SOLN
INTRAVENOUS | Status: DC
  Administered 2018-10-24 – 2018-10-28 (×2): via INTRAVENOUS

## 2018-10-24 MED ORDER — GADOBUTROL 1 MMOL/ML IV SOLN
5.0000 mL | Freq: Once | INTRAVENOUS | Status: AC | PRN
  Administered 2018-10-24: 5 mL via INTRAVENOUS

## 2018-10-24 MED ORDER — SIMVASTATIN 20 MG PO TABS
20.0000 mg | ORAL_TABLET | Freq: Every evening | ORAL | Status: DC
  Administered 2018-10-25 – 2018-10-31 (×7): 20 mg via ORAL
  Filled 2018-10-24 (×8): qty 1

## 2018-10-24 MED ORDER — BACITRACIN ZINC 500 UNIT/GM EX OINT
TOPICAL_OINTMENT | Freq: Once | CUTANEOUS | Status: AC
  Administered 2018-10-24: 20:00:00 via TOPICAL
  Filled 2018-10-24: qty 0.9

## 2018-10-24 MED ORDER — LISINOPRIL-HYDROCHLOROTHIAZIDE 20-12.5 MG PO TABS: 1.0000 | ORAL_TABLET | Freq: Every day | ORAL | Status: DC

## 2018-10-24 MED ORDER — AMLODIPINE BESYLATE 5 MG PO TABS
5.0000 mg | ORAL_TABLET | Freq: Every day | ORAL | Status: DC
  Administered 2018-10-25 – 2018-10-26 (×3): 5 mg via ORAL
  Filled 2018-10-24 (×3): qty 1

## 2018-10-24 MED ORDER — TETANUS-DIPHTH-ACELL PERTUSSIS 5-2.5-18.5 LF-MCG/0.5 IM SUSP
0.5000 mL | Freq: Once | INTRAMUSCULAR | Status: AC
  Administered 2018-10-24: 0.5 mL via INTRAMUSCULAR
  Filled 2018-10-24: qty 0.5

## 2018-10-24 MED ORDER — PROSIGHT PO TABS
1.0000 | ORAL_TABLET | Freq: Every day | ORAL | Status: DC
  Administered 2018-10-25 – 2018-11-01 (×8): 1 via ORAL
  Filled 2018-10-24 (×8): qty 1

## 2018-10-24 MED ORDER — ONDANSETRON HCL 4 MG PO TABS: 4.0000 mg | ORAL_TABLET | Freq: Four times a day (QID) | ORAL | Status: DC | PRN

## 2018-10-24 MED ORDER — PANTOPRAZOLE SODIUM 40 MG PO TBEC
40.0000 mg | DELAYED_RELEASE_TABLET | Freq: Every day | ORAL | Status: DC
  Administered 2018-10-25 – 2018-11-01 (×9): 40 mg via ORAL
  Filled 2018-10-24 (×9): qty 1

## 2018-10-24 MED ORDER — HYDROCHLOROTHIAZIDE 12.5 MG PO CAPS
12.5000 mg | ORAL_CAPSULE | Freq: Every day | ORAL | Status: DC
  Administered 2018-10-25 – 2018-10-26 (×2): 12.5 mg via ORAL
  Filled 2018-10-24 (×3): qty 1

## 2018-10-24 MED ORDER — LISINOPRIL 20 MG PO TABS
20.0000 mg | ORAL_TABLET | Freq: Every day | ORAL | Status: DC
  Administered 2018-10-25 – 2018-11-01 (×8): 20 mg via ORAL
  Filled 2018-10-24 (×8): qty 1

## 2018-10-24 NOTE — H&P (Signed)
History and Physical   Tammy Navarro:[id number] DOB: 09/04/1940 DOA: 10/24/2018  Referring MD/NP/PA: Dr Rush Landmarkegeler  PCP: [name], [name], MD   Outpatient Specialists: None  Patient coming from: Home  Chief Complaint: Fall at home  HPI: Tammy BoardsLoretta J Happel is a 78 y.o. female with medical history significant of hypertension but no prior known significant history who was found on the floor today at [employer] parking lot after sustaining a fall that was not witnessed.  A bystander called 911 and patient was brought to the ER.  She was found to have blood all over the left side of her head and scalp.  Patient is awake now but has no recollection of what happened.  She has no recollection of going to the [employer] store.  She lives alone at home and according to her son she was supposed to have going to the cystoscopy to get grocery for a Mother's Day meal that they were supposed to have later in the day.  She usually does that alone.  She takes care of herself without much assistance.  Patient is found to have a small subarachnoid hemorrhage, new onset A. fib as well as findings on her CT suggestive of possible CVA.  She is being admitted to the hospital for evaluation and treatment.  She is still unable to give me any adequate history.  She responds to questions but has completely lost memory of what happened earlier today..  ED Course: Temperature is 97.9 blood pressure 174/95 pulse 105 respiratory rate of 26 oxygen sats 97% on room air.  She has a white count of 6.7 hemoglobin 14.2 and platelets 264.  Sodium 124 potassium 2.8 and chloride 81.  Rest of the chemistry appears to be within normal.  Urinalysis essentially negative except for white count of 11.  Head CT cervical spine and maxillofacial showed nonspecific abnormal hypodensity in the right inferior temporal gyrus.  This include an infarct infection encephalomalacia and less likely tumor related edema.  Also small volume posttraumatic  subarachnoid hemorrhage over the convexities.  No mass-effect.  Left periorbital scalp hematoma is noted.  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  Neurosurgery Dr. Danielle DessElsner consulted and patient is being admitted to the hospital for work-up.  Review of Systems: As per HPI otherwise 10 point review of systems negative.    Past Medical History:  Diagnosis Date   Hypertension     History reviewed. No pertinent surgical history.   reports that she has never smoked. She does not have any smokeless tobacco history on file. She reports that she does not drink alcohol or use drugs.  No Known Allergies  No family history on file.   Prior to Admission medications   Medication Sig Start Date End Date Taking? Authorizing Provider  amLODipine (NORVASC) 5 MG tablet Take 5 mg by mouth daily.    [provider]  atenolol (TENORMIN) 100 MG tablet Take 100 mg by mouth daily.    [provider]  lisinopril-hydrochlorothiazide (PRINZIDE,ZESTORETIC) 20-12.5 MG per tablet Take 1 tablet by mouth daily.    [provider]  loperamide (IMODIUM A-D) 2 MG tablet Take 1 tablet (2 mg total) by mouth 4 (four) times daily as needed for diarrhea or loose stools. 11/06/12   Carmelina DaneAnderson, Jeffery S, MD  multivitamin (GERI-TONIC) LIQD Take 30 mLs by mouth daily.    [provider]  omeprazole (PRILOSEC) 20 MG capsule Take 20 mg by mouth daily.  [provider]  ondansetron (ZOFRAN-ODT) 8 MG disintegrating tablet Take 1 tablet (8 mg total) by mouth every 8 (eight) hours as needed for nausea. 11/06/12   Carmelina Dane, MD  simvastatin (ZOCOR) 20 MG tablet Take 20 mg by mouth every evening.    [provider]    Physical Exam: Vitals:   10/24/18 1512 10/24/18 1530 10/24/18 1612 10/24/18 1630  BP:  (!) 168/97  (!) 173/89  Pulse: 93 79 (!) 105 86  Resp: 16 14 (!) 26 (!) 21  Temp: 97.9 F (36.6 C)     TempSrc: Oral     SpO2: 98%  99% 97% 98%  Weight:      Height:          Constitutional: NAD, calm, no distress, face covered in blood Vitals:   10/24/18 1512 10/24/18 1530 10/24/18 1612 10/24/18 1630  BP:  (!) 168/97  (!) 173/89  Pulse: 93 79 (!) 105 86  Resp: 16 14 (!) 26 (!) 21  Temp: 97.9 F (36.6 C)     TempSrc: Oral     SpO2: 98% 99% 97% 98%  Weight:      Height:       Eyes: PERRL, lids and conjunctivae normal, left periorbital swelling with laceration and hematoma ENMT: Mucous membranes are moist. Posterior pharynx clear of any exudate or lesions.Normal dentition.  Neck: normal, supple, no masses, no thyromegaly Respiratory: clear to auscultation bilaterally, no wheezing, no crackles. Normal respiratory effort. No accessory muscle use.  Cardiovascular: Irregularly irregular rate and rhythm, no murmurs / rubs / gallops. No extremity edema. 2+ pedal pulses. No carotid bruits.  Abdomen: no tenderness, no masses palpated. No hepatosplenomegaly. Bowel sounds positive.  Musculoskeletal: no clubbing / cyanosis. No joint deformity upper and lower extremities. Good ROM, no contractures. Normal muscle tone.  Skin: no rashes, facial lesion with laceration, ulcers. No induration Neurologic: CN 2-12 grossly intact. Sensation intact, DTR normal. Strength 5/5 in all 4.  Psychiatric: Slightly confused, patient has no memory of recent events, no agitation. Normal mood.     Labs on Admission: I have personally reviewed following labs and imaging studies  CBC: Recent Labs  Lab 10/24/18 1545  WBC 6.7  NEUTROABS 5.0  HGB 14.2  HCT 39.3  MCV 86.4  PLT 264   Basic Metabolic Panel: Recent Labs  Lab 10/24/18 1545  NA 124*  K 2.8*  CL 81*  CO2 25  GLUCOSE 97  BUN 15  CREATININE 0.64  CALCIUM 9.1   GFR: Estimated Creatinine Clearance: 53.1 mL/min (by C-G formula based on SCr of 0.64 mg/dL). Liver Function Tests: Recent Labs  Lab 10/24/18 1545  AST 29  ALT 16  ALKPHOS 70  BILITOT 1.1  PROT 6.8   ALBUMIN 3.7   No results for input(s): LIPASE, AMYLASE in the last 168 hours. No results for input(s): AMMONIA in the last 168 hours. Coagulation Profile: No results for input(s): INR, PROTIME in the last 168 hours. Cardiac Enzymes: Recent Labs  Lab 10/24/18 1545  TROPONINI <0.03   BNP (last 3 results) No results for input(s): PROBNP in the last 8760 hours. HbA1C: No results for input(s): HGBA1C in the last 72 hours. CBG: No results for input(s): GLUCAP in the last 168 hours. Lipid Profile: No results for input(s): CHOL, HDL, LDLCALC, TRIG, CHOLHDL, LDLDIRECT in the last 72 hours. Thyroid Function Tests: No results for input(s): TSH, T4TOTAL, FREET4, T3FREE, THYROIDAB in the last 72 hours. Anemia Panel: No results for input(s):  VITAMINB12, FOLATE, FERRITIN, TIBC, IRON, RETICCTPCT in the last 72 hours. Urine analysis:    Component Value Date/Time   COLORURINE YELLOW 10/24/2018 1630   APPEARANCEUR CLEAR 10/24/2018 1630   LABSPEC 1.008 10/24/2018 1630   PHURINE 6.0 10/24/2018 1630   GLUCOSEU NEGATIVE 10/24/2018 1630   HGBUR SMALL (A) 10/24/2018 1630   BILIRUBINUR NEGATIVE 10/24/2018 1630   KETONESUR 5 (A) 10/24/2018 1630   PROTEINUR NEGATIVE 10/24/2018 1630   UROBILINOGEN 1.0 09/25/2011 0635   NITRITE NEGATIVE 10/24/2018 1630   LEUKOCYTESUR TRACE (A) 10/24/2018 1630   Sepsis Labs: @LABRCNTIP (procalcitonin:4,lacticidven:4) )No results found for this or any previous visit (from the past 240 hour(s)).   Radiological Exams on Admission: Ct Head Wo Contrast  Addendum Date: 10/24/2018   ADDENDUM REPORT: 10/24/2018 16:33 ADDENDUM: Study discussed by telephone with PA-C JAIME WARD on 10/24/2018 at 16:32 . Electronically Signed   By: Odessa Fleming M.D.   On: 10/24/2018 16:33   Result Date: 10/24/2018 CLINICAL DATA:  78 year old female with unwitnessed fall. Found in parking lot. Confused. Blunt trauma. EXAM: CT HEAD WITHOUT CONTRAST CT MAXILLOFACIAL WITHOUT CONTRAST CT CERVICAL SPINE  WITHOUT CONTRAST TECHNIQUE: Multidetector CT imaging of the head, cervical spine, and maxillofacial structures were performed using the standard protocol without intravenous contrast. Multiplanar CT image reconstructions of the cervical spine and maxillofacial structures were also generated. COMPARISON:  None. FINDINGS: CT HEAD FINDINGS Brain: Small volume subarachnoid hemorrhage over the superolateral hemispheres left greater than right (series 5, image 23). No basilar cistern or intraventricular blood. No subdural or other extra-axial blood identified. No intracranial mass effect. No ventriculomegaly. Patchy and confluent bilateral cerebral white matter hypodensity, but abnormal gray matter hypodensity in the right inferior temporal gyrus (series 5, image 12 and coronal image 39. No significant mass effect. The mesial right temporal lobe seems to remain normal. No intra-axial blood identified. Dystrophic basal ganglia calcifications. Vascular: Calcified atherosclerosis at the skull base. No suspicious intracranial vascular hyperdensity. Skull: No calvarium fracture identified. Other: Superficial left periorbital hematoma described below. Other scalp soft tissues are within normal limits. CT MAXILLOFACIAL FINDINGS Osseous: Largely absent dentition. Mandible intact. No maxilla, zygoma, or nasal bone fracture identified. Central skull base appears intact. Orbits: Intact orbital walls. Left superior periorbital superficial scalp hematoma measuring up to 10 millimeters in thickness. Underlying left frontal bone intact. Globes and intraorbital soft tissues appear normal. Sinuses: Well pneumatized. Tympanic cavities and mastoids are clear. Negative nasal cavity aside from leftward septal deviation and trace retained secretions on the right. Soft tissues: Calcified carotid atherosclerosis in the neck. Partially retropharyngeal course of the right carotid. Superimposed postinflammatory calcifications of the palatine  tonsils. Negative parapharyngeal spaces, retropharyngeal space, sublingual space, submandibular, parotid, and masticator spaces. No other superficial soft tissue injury identified. No upper cervical lymphadenopathy. CT CERVICAL SPINE FINDINGS Alignment: Degenerative appearing mild retrolisthesis of C5 on C6 with ankylosis at C4-C5 and also C2-C3. Straightening of lordosis otherwise. Posterior element alignment remains normal. Skull base and vertebrae: Visualized skull base is intact. No atlanto-occipital dissociation. Osteopenia. No acute osseous abnormality identified. Soft tissues and spinal canal: No prevertebral fluid or swelling. No visible canal hematoma. Calcified carotid atherosclerosis in the neck as stated above. Disc levels: Advanced C1-C2 degeneration including partially calcified ligamentous hypertrophy. Ankylosis of C2-C3 posterior elements on the left and C4-C5 vertebral bodies and right posterior elements. Advanced disc and endplate degeneration at C5-C6 with superimposed mild retrolisthesis. Mild to moderate spinal stenosis at that level. Upper chest: Grossly intact visible upper thoracic levels. Negative visible  lung apices. IMPRESSION: 1. Nonspecific abnormal hypodensity in the right inferior temporal gyrus. Differential considerations include infarct, infection, encephalomalacia, and less likely tumor related edema. Recommend follow-up MRI brain without and with contrast to further characterize. 2. Superimposed Small volume posttraumatic subarachnoid hemorrhage over the convexities. No intracranial mass effect or ventriculomegaly. 3. No other acute traumatic injury to the brain identified. No fracture of the skull, face, or cervical spine identified. 4. Left periorbital superficial scalp hematoma. 5. 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 Electronically Signed: By: Odessa Fleming M.D. On: 10/24/2018 16:28   Ct Cervical Spine Wo  Contrast  Addendum Date: 10/24/2018   ADDENDUM REPORT: 10/24/2018 16:33 ADDENDUM: Study discussed by telephone with PA-C JAIME WARD on 10/24/2018 at 16:32 . Electronically Signed   By: Odessa Fleming M.D.   On: 10/24/2018 16:33   Result Date: 10/24/2018 CLINICAL DATA:  78 year old female with unwitnessed fall. Found in parking lot. Confused. Blunt trauma. EXAM: CT HEAD WITHOUT CONTRAST CT MAXILLOFACIAL WITHOUT CONTRAST CT CERVICAL SPINE WITHOUT CONTRAST TECHNIQUE: Multidetector CT imaging of the head, cervical spine, and maxillofacial structures were performed using the standard protocol without intravenous contrast. Multiplanar CT image reconstructions of the cervical spine and maxillofacial structures were also generated. COMPARISON:  None. FINDINGS: CT HEAD FINDINGS Brain: Small volume subarachnoid hemorrhage over the superolateral hemispheres left greater than right (series 5, image 23). No basilar cistern or intraventricular blood. No subdural or other extra-axial blood identified. No intracranial mass effect. No ventriculomegaly. Patchy and confluent bilateral cerebral white matter hypodensity, but abnormal gray matter hypodensity in the right inferior temporal gyrus (series 5, image 12 and coronal image 39. No significant mass effect. The mesial right temporal lobe seems to remain normal. No intra-axial blood identified. Dystrophic basal ganglia calcifications. Vascular: Calcified atherosclerosis at the skull base. No suspicious intracranial vascular hyperdensity. Skull: No calvarium fracture identified. Other: Superficial left periorbital hematoma described below. Other scalp soft tissues are within normal limits. CT MAXILLOFACIAL FINDINGS Osseous: Largely absent dentition. Mandible intact. No maxilla, zygoma, or nasal bone fracture identified. Central skull base appears intact. Orbits: Intact orbital walls. Left superior periorbital superficial scalp hematoma measuring up to 10 millimeters in thickness.  Underlying left frontal bone intact. Globes and intraorbital soft tissues appear normal. Sinuses: Well pneumatized. Tympanic cavities and mastoids are clear. Negative nasal cavity aside from leftward septal deviation and trace retained secretions on the right. Soft tissues: Calcified carotid atherosclerosis in the neck. Partially retropharyngeal course of the right carotid. Superimposed postinflammatory calcifications of the palatine tonsils. Negative parapharyngeal spaces, retropharyngeal space, sublingual space, submandibular, parotid, and masticator spaces. No other superficial soft tissue injury identified. No upper cervical lymphadenopathy. CT CERVICAL SPINE FINDINGS Alignment: Degenerative appearing mild retrolisthesis of C5 on C6 with ankylosis at C4-C5 and also C2-C3. Straightening of lordosis otherwise. Posterior element alignment remains normal. Skull base and vertebrae: Visualized skull base is intact. No atlanto-occipital dissociation. Osteopenia. No acute osseous abnormality identified. Soft tissues and spinal canal: No prevertebral fluid or swelling. No visible canal hematoma. Calcified carotid atherosclerosis in the neck as stated above. Disc levels: Advanced C1-C2 degeneration including partially calcified ligamentous hypertrophy. Ankylosis of C2-C3 posterior elements on the left and C4-C5 vertebral bodies and right posterior elements. Advanced disc and endplate degeneration at C5-C6 with superimposed mild retrolisthesis. Mild to moderate spinal stenosis at that level. Upper chest: Grossly intact visible upper thoracic levels. Negative visible lung apices. IMPRESSION: 1. Nonspecific abnormal hypodensity in the right inferior temporal gyrus.  Differential considerations include infarct, infection, encephalomalacia, and less likely tumor related edema. Recommend follow-up MRI brain without and with contrast to further characterize. 2. Superimposed Small volume posttraumatic subarachnoid hemorrhage over  the convexities. No intracranial mass effect or ventriculomegaly. 3. No other acute traumatic injury to the brain identified. No fracture of the skull, face, or cervical spine identified. 4. Left periorbital superficial scalp hematoma. 5. 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 Electronically Signed: By: Odessa Fleming M.D. On: 10/24/2018 16:28   Ct Maxillofacial Wo Contrast  Addendum Date: 10/24/2018   ADDENDUM REPORT: 10/24/2018 16:33 ADDENDUM: Study discussed by telephone with PA-C JAIME WARD on 10/24/2018 at 16:32 . Electronically Signed   By: Odessa Fleming M.D.   On: 10/24/2018 16:33   Result Date: 10/24/2018 CLINICAL DATA:  78 year old female with unwitnessed fall. Found in parking lot. Confused. Blunt trauma. EXAM: CT HEAD WITHOUT CONTRAST CT MAXILLOFACIAL WITHOUT CONTRAST CT CERVICAL SPINE WITHOUT CONTRAST TECHNIQUE: Multidetector CT imaging of the head, cervical spine, and maxillofacial structures were performed using the standard protocol without intravenous contrast. Multiplanar CT image reconstructions of the cervical spine and maxillofacial structures were also generated. COMPARISON:  None. FINDINGS: CT HEAD FINDINGS Brain: Small volume subarachnoid hemorrhage over the superolateral hemispheres left greater than right (series 5, image 23). No basilar cistern or intraventricular blood. No subdural or other extra-axial blood identified. No intracranial mass effect. No ventriculomegaly. Patchy and confluent bilateral cerebral white matter hypodensity, but abnormal gray matter hypodensity in the right inferior temporal gyrus (series 5, image 12 and coronal image 39. No significant mass effect. The mesial right temporal lobe seems to remain normal. No intra-axial blood identified. Dystrophic basal ganglia calcifications. Vascular: Calcified atherosclerosis at the skull base. No suspicious intracranial vascular hyperdensity. Skull: No calvarium fracture  identified. Other: Superficial left periorbital hematoma described below. Other scalp soft tissues are within normal limits. CT MAXILLOFACIAL FINDINGS Osseous: Largely absent dentition. Mandible intact. No maxilla, zygoma, or nasal bone fracture identified. Central skull base appears intact. Orbits: Intact orbital walls. Left superior periorbital superficial scalp hematoma measuring up to 10 millimeters in thickness. Underlying left frontal bone intact. Globes and intraorbital soft tissues appear normal. Sinuses: Well pneumatized. Tympanic cavities and mastoids are clear. Negative nasal cavity aside from leftward septal deviation and trace retained secretions on the right. Soft tissues: Calcified carotid atherosclerosis in the neck. Partially retropharyngeal course of the right carotid. Superimposed postinflammatory calcifications of the palatine tonsils. Negative parapharyngeal spaces, retropharyngeal space, sublingual space, submandibular, parotid, and masticator spaces. No other superficial soft tissue injury identified. No upper cervical lymphadenopathy. CT CERVICAL SPINE FINDINGS Alignment: Degenerative appearing mild retrolisthesis of C5 on C6 with ankylosis at C4-C5 and also C2-C3. Straightening of lordosis otherwise. Posterior element alignment remains normal. Skull base and vertebrae: Visualized skull base is intact. No atlanto-occipital dissociation. Osteopenia. No acute osseous abnormality identified. Soft tissues and spinal canal: No prevertebral fluid or swelling. No visible canal hematoma. Calcified carotid atherosclerosis in the neck as stated above. Disc levels: Advanced C1-C2 degeneration including partially calcified ligamentous hypertrophy. Ankylosis of C2-C3 posterior elements on the left and C4-C5 vertebral bodies and right posterior elements. Advanced disc and endplate degeneration at C5-C6 with superimposed mild retrolisthesis. Mild to moderate spinal stenosis at that level. Upper chest:  Grossly intact visible upper thoracic levels. Negative visible lung apices. IMPRESSION: 1. Nonspecific abnormal hypodensity in the right inferior temporal gyrus. Differential considerations include infarct, infection, encephalomalacia, and less likely tumor related edema. Recommend  follow-up MRI brain without and with contrast to further characterize. 2. Superimposed Small volume posttraumatic subarachnoid hemorrhage over the convexities. No intracranial mass effect or ventriculomegaly. 3. No other acute traumatic injury to the brain identified. No fracture of the skull, face, or cervical spine identified. 4. Left periorbital superficial scalp hematoma. 5. 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 Electronically Signed: By: Odessa Fleming M.D. On: 10/24/2018 16:28    EKG: Independently reviewed.  It shows atrial fibrillation with a rate of 89, no other specific findings, no old EKG to compare.  Assessment/Plan Principal Problem:   SAH (subarachnoid hemorrhage) (HCC) Active Problems:   Fall at home, initial encounter   HTN (hypertension)   New onset a-fib (HCC)   CVA (cerebral vascular accident) (HCC)   Hyponatremia   Hypokalemia     #1 subarachnoid hemorrhage: Status post a fall.  Patient may have had CVA of syncope.  Patient will be admitted for observation.  Neurosurgery already consulted.  She may require follow-up CT tomorrow.  No anticoagulation.  #2 status post fall: Patient has no recollection of what happened.  Suspected syncopal episode versus acute CVA especially with her new A. fib.  We will follow MRI of the brain.  If stroke is confirmed we will add other work-up.  Patient is going to get an echocardiogram.  #3 atrial fibrillation: No prior history per son.  Assume this is new.  Patient cannot get anticoagulated due to the subarachnoid hemorrhage but will monitor.  Rate is already controlled.  Echocardiogram will be  ordered.  #4 hypertension: Blood pressure has been uncontrolled.  I will continue with her home regimen of amlodipine.  #5 hyponatremia: Cause is not entirely clear.  Potassium also low.  Patient may have had recent nausea vomiting diarrhea or dehydration.  She is unable to give me the history however.  Gentle saline and monitor sodium level.  If she has a brain tumor as suggested by the CT this could be SIADH.  #6 hypokalemia: Replete potassium.  #7 possible CVA versus brain tumor: CT findings of abnormality will be evaluated with MRI of the brain with and without contrast.   DVT prophylaxis: SCD Code Status: Full code Family Communication: Son over the phone Disposition Plan: To be determined Consults called: Dr. Danielle Dess of neurosurgery Admission status: Inpatient  Severity of Illness: The appropriate patient status for this patient is INPATIENT. Inpatient status is judged to be reasonable and necessary in order to provide the required intensity of service to ensure the patient's safety. The patient's presenting symptoms, physical exam findings, and initial radiographic and laboratory data in the context of their chronic comorbidities is felt to place them at high risk for further clinical deterioration. Furthermore, it is not anticipated that the patient will be medically stable for discharge from the hospital within 2 midnights of admission. The following factors support the patient status of inpatient.   " The patient's presenting symptoms include fall. " The worrisome physical exam findings include laceration and bleeding on the right side with memory loss. " The initial radiographic and laboratory data are worrisome because of CT findings of subarachnoid hemorrhage. " The chronic co-morbidities include hypertension.   * I certify that at the point of admission it is my clinical judgment that the patient will require inpatient hospital care spanning beyond 2 midnights from the point  of admission due to high intensity of service, high risk for further deterioration and high frequency of  surveillance required.Lonia Blood MD [facility] Pager [phone]  If 7PM-7AM, please contact night-coverage [email or web address] Password [facility]  10/24/2018, 6:48 PM

## 2018-10-24 NOTE — ED Triage Notes (Signed)
[facility]- pt found in [employer] parking lot. Unwitnessed fall. Pt was alert but confused on day. Pt has lac to left eye. Stroke screen was negative. Denies neck or back pain.    170/98 70 HR 98% RA 16 RR

## 2018-10-24 NOTE — ED Notes (Signed)
Pt back from CT

## 2018-10-24 NOTE — ED Notes (Signed)
Please call son Jamison Neighbor to update on pt's condition. [phone]

## 2018-10-24 NOTE — ED Notes (Signed)
MRI unable to take pt at this time due to pending COVID test.

## 2018-10-24 NOTE — ED Notes (Signed)
Patient transported to CT 

## 2018-10-24 NOTE — ED Provider Notes (Signed)
[facility] EMERGENCY DEPARTMENT Provider Note   CSN: [id number] Arrival date & time: 10/24/18  1500    History   Chief Complaint No chief complaint on file.   HPI ARAIYAH CUMPTON is a 78 y.o. female.     The history is provided by the patient, medical records, a relative and the EMS personnel (Son via telephone). No language interpreter was used.   KAHLYN SHIPPEY is a 78 y.o. female  with a PMH of HTN who presents to the Emergency Department after unwitnessed fall versus syncope today.  Per EMS, patient was found down in the [employer] parking lot.  Bystander called 911.  Patient states she remembers waking up in her usual state of health, but does not remember going to the grocery store.  She is unable to tell me any history about the events leading to today's hospitalization.  I spoke with her son on the phone, who states that she does live alone.  She went to the grocery store to get items for Mother's Day lunch and they were planning on meeting up for a meal late afternoon.  She typically is able to go to the store by herself, living alone and able to provide all of her own care/ADLs.  Currently, patient denies any pain.  Unable to tell me about loss of consciousness.  Does not believe that she is on any blood thinners.  No blood thinners listed in med rec.   Level V caveat applies 2/2 patient condition, mental status change.   Past Medical History:  Diagnosis Date   Hypertension     There are no active problems to display for this patient.   History reviewed. No pertinent surgical history.   OB History   No obstetric history on file.      Home Medications    Prior to Admission medications   Medication Sig Start Date End Date Taking? Authorizing Provider  amLODipine (NORVASC) 5 MG tablet Take 5 mg by mouth daily.    [provider]  atenolol (TENORMIN) 100 MG tablet Take 100 mg by mouth daily.    [provider]   lisinopril-hydrochlorothiazide (PRINZIDE,ZESTORETIC) 20-12.5 MG per tablet Take 1 tablet by mouth daily.    [provider]  loperamide (IMODIUM A-D) 2 MG tablet Take 1 tablet (2 mg total) by mouth 4 (four) times daily as needed for diarrhea or loose stools. 11/06/12   Carmelina Dane, MD  multivitamin (GERI-TONIC) LIQD Take 30 mLs by mouth daily.    [provider]  omeprazole (PRILOSEC) 20 MG capsule Take 20 mg by mouth daily.    [provider]  ondansetron (ZOFRAN-ODT) 8 MG disintegrating tablet Take 1 tablet (8 mg total) by mouth every 8 (eight) hours as needed for nausea. 11/06/12   Carmelina Dane, MD  simvastatin (ZOCOR) 20 MG tablet Take 20 mg by mouth every evening.    [provider]    Family History No family history on file.  Social History Social History   Tobacco Use   Smoking status: Never Smoker  Substance Use Topics   Alcohol use: No   Drug use: No     Allergies   Patient has no known allergies.   Review of Systems Review of Systems  Unable to perform ROS: Mental status change  Skin: Positive for wound.     Physical Exam Updated Vital Signs BP (!) 173/89    Pulse 86    Temp 97.9 F (  36.6 C) (Oral)    Resp (!) 21    Ht 6' (1.829 m)    Wt 59 kg    SpO2 98%    BMI 17.63 kg/m   Physical Exam Vitals signs and nursing note reviewed.  Constitutional:      General: She is not in acute distress.    Appearance: She is well-developed.  HENT:     Head: Normocephalic.     Comments: 2.5 cm laceration over left eyebrow with associated swelling.  Neck:     Musculoskeletal: Neck supple.     Comments: C-collar in place. Mild cervical tenderness. Cardiovascular:     Rate and Rhythm: Normal rate and regular rhythm.     Heart sounds: Normal heart sounds. No murmur.  Pulmonary:     Effort: Pulmonary effort is normal. No respiratory distress.     Breath sounds: Normal breath sounds.  Abdominal:     General: There is  no distension.     Palpations: Abdomen is soft.     Tenderness: There is no abdominal tenderness.  Skin:    General: Skin is warm and dry.  Neurological:     Mental Status: She is alert.     Comments: A&O. Speech clear. Follows commands well. Unable to give clear history about today's events. CN 2-12 grossly intact. Strength and sensation intact. Gait testing deferred.      ED Treatments / Results  Labs (all labs ordered are listed, but only abnormal results are displayed) Labs Reviewed  COMPREHENSIVE METABOLIC PANEL - Abnormal; Notable for the following components:      Result Value   Sodium 124 (*)    Potassium 2.8 (*)    Chloride 81 (*)    Anion gap 18 (*)    All other components within normal limits  CBC WITH DIFFERENTIAL/PLATELET - Abnormal; Notable for the following components:   MCHC 36.1 (*)    All other components within normal limits  URINALYSIS, ROUTINE W REFLEX MICROSCOPIC - Abnormal; Notable for the following components:   Hgb urine dipstick SMALL (*)    Ketones, ur 5 (*)    Leukocytes,Ua TRACE (*)    Bacteria, UA RARE (*)    All other components within normal limits  SARS CORONAVIRUS 2 (HOSPITAL ORDER, [facility]FOR[facility])  TROPONIN I    EKG EKG Interpretation  Date/Time:  Sunday Oct 24 2018 16:06:06 EDT Ventricular Rate:  89 PR Interval:    QRS Duration: 115 QT Interval:  411 QTC Calculation: 501 R Axis:   56 Text Interpretation:  Atrial fibrillation Nonspecific intraventricular conduction delay Borderline low voltage, extremity leads When compared to prior, new afib/  No STEMI Confirmed by Theda Belfastegeler, Chris ([id number]) on 10/24/2018 4:15:57 PM   Radiology Ct Head Wo Contrast  Addendum Date: 10/24/2018   ADDENDUM REPORT: 10/24/2018 16:33 ADDENDUM: Study discussed by telephone with PA-C Charlsie Fleeger on 10/24/2018 at 16:32 . Electronically Signed   By: Odessa FlemingH  Hall M.D.   On: 10/24/2018 16:33   Result Date: 10/24/2018 CLINICAL DATA:  78 year old  female with unwitnessed fall. Found in parking lot. Confused. Blunt trauma. EXAM: CT HEAD WITHOUT CONTRAST CT MAXILLOFACIAL WITHOUT CONTRAST CT CERVICAL SPINE WITHOUT CONTRAST TECHNIQUE: Multidetector CT imaging of the head, cervical spine, and maxillofacial structures were performed using the standard protocol without intravenous contrast. Multiplanar CT image reconstructions of the cervical spine and maxillofacial structures were also generated. COMPARISON:  None. FINDINGS: CT HEAD FINDINGS Brain: Small volume subarachnoid hemorrhage over the superolateral  hemispheres left greater than right (series 5, image 23). No basilar cistern or intraventricular blood. No subdural or other extra-axial blood identified. No intracranial mass effect. No ventriculomegaly. Patchy and confluent bilateral cerebral white matter hypodensity, but abnormal gray matter hypodensity in the right inferior temporal gyrus (series 5, image 12 and coronal image 39. No significant mass effect. The mesial right temporal lobe seems to remain normal. No intra-axial blood identified. Dystrophic basal ganglia calcifications. Vascular: Calcified atherosclerosis at the skull base. No suspicious intracranial vascular hyperdensity. Skull: No calvarium fracture identified. Other: Superficial left periorbital hematoma described below. Other scalp soft tissues are within normal limits. CT MAXILLOFACIAL FINDINGS Osseous: Largely absent dentition. Mandible intact. No maxilla, zygoma, or nasal bone fracture identified. Central skull base appears intact. Orbits: Intact orbital walls. Left superior periorbital superficial scalp hematoma measuring up to 10 millimeters in thickness. Underlying left frontal bone intact. Globes and intraorbital soft tissues appear normal. Sinuses: Well pneumatized. Tympanic cavities and mastoids are clear. Negative nasal cavity aside from leftward septal deviation and trace retained secretions on the right. Soft tissues: Calcified  carotid atherosclerosis in the neck. Partially retropharyngeal course of the right carotid. Superimposed postinflammatory calcifications of the palatine tonsils. Negative parapharyngeal spaces, retropharyngeal space, sublingual space, submandibular, parotid, and masticator spaces. No other superficial soft tissue injury identified. No upper cervical lymphadenopathy. CT CERVICAL SPINE FINDINGS Alignment: Degenerative appearing mild retrolisthesis of C5 on C6 with ankylosis at C4-C5 and also C2-C3. Straightening of lordosis otherwise. Posterior element alignment remains normal. Skull base and vertebrae: Visualized skull base is intact. No atlanto-occipital dissociation. Osteopenia. No acute osseous abnormality identified. Soft tissues and spinal canal: No prevertebral fluid or swelling. No visible canal hematoma. Calcified carotid atherosclerosis in the neck as stated above. Disc levels: Advanced C1-C2 degeneration including partially calcified ligamentous hypertrophy. Ankylosis of C2-C3 posterior elements on the left and C4-C5 vertebral bodies and right posterior elements. Advanced disc and endplate degeneration at C5-C6 with superimposed mild retrolisthesis. Mild to moderate spinal stenosis at that level. Upper chest: Grossly intact visible upper thoracic levels. Negative visible lung apices. IMPRESSION: 1. Nonspecific abnormal hypodensity in the right inferior temporal gyrus. Differential considerations include infarct, infection, encephalomalacia, and less likely tumor related edema. Recommend follow-up MRI brain without and with contrast to further characterize. 2. Superimposed Small volume posttraumatic subarachnoid hemorrhage over the convexities. No intracranial mass effect or ventriculomegaly. 3. No other acute traumatic injury to the brain identified. No fracture of the skull, face, or cervical spine identified. 4. Left periorbital superficial scalp hematoma. 5. 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 Electronically Signed: By: Odessa Fleming M.D. On: 10/24/2018 16:28   Ct Cervical Spine Wo Contrast  Addendum Date: 10/24/2018   ADDENDUM REPORT: 10/24/2018 16:33 ADDENDUM: Study discussed by telephone with PA-C Deliah Strehlow on 10/24/2018 at 16:32 . Electronically Signed   By: Odessa Fleming M.D.   On: 10/24/2018 16:33   Result Date: 10/24/2018 CLINICAL DATA:  78 year old female with unwitnessed fall. Found in parking lot. Confused. Blunt trauma. EXAM: CT HEAD WITHOUT CONTRAST CT MAXILLOFACIAL WITHOUT CONTRAST CT CERVICAL SPINE WITHOUT CONTRAST TECHNIQUE: Multidetector CT imaging of the head, cervical spine, and maxillofacial structures were performed using the standard protocol without intravenous contrast. Multiplanar CT image reconstructions of the cervical spine and maxillofacial structures were also generated. COMPARISON:  None. FINDINGS: CT HEAD FINDINGS Brain: Small volume subarachnoid hemorrhage over the superolateral hemispheres left greater than right (series 5, image 23). No basilar cistern or  intraventricular blood. No subdural or other extra-axial blood identified. No intracranial mass effect. No ventriculomegaly. Patchy and confluent bilateral cerebral white matter hypodensity, but abnormal gray matter hypodensity in the right inferior temporal gyrus (series 5, image 12 and coronal image 39. No significant mass effect. The mesial right temporal lobe seems to remain normal. No intra-axial blood identified. Dystrophic basal ganglia calcifications. Vascular: Calcified atherosclerosis at the skull base. No suspicious intracranial vascular hyperdensity. Skull: No calvarium fracture identified. Other: Superficial left periorbital hematoma described below. Other scalp soft tissues are within normal limits. CT MAXILLOFACIAL FINDINGS Osseous: Largely absent dentition. Mandible intact. No maxilla, zygoma, or nasal bone fracture identified. Central skull base appears  intact. Orbits: Intact orbital walls. Left superior periorbital superficial scalp hematoma measuring up to 10 millimeters in thickness. Underlying left frontal bone intact. Globes and intraorbital soft tissues appear normal. Sinuses: Well pneumatized. Tympanic cavities and mastoids are clear. Negative nasal cavity aside from leftward septal deviation and trace retained secretions on the right. Soft tissues: Calcified carotid atherosclerosis in the neck. Partially retropharyngeal course of the right carotid. Superimposed postinflammatory calcifications of the palatine tonsils. Negative parapharyngeal spaces, retropharyngeal space, sublingual space, submandibular, parotid, and masticator spaces. No other superficial soft tissue injury identified. No upper cervical lymphadenopathy. CT CERVICAL SPINE FINDINGS Alignment: Degenerative appearing mild retrolisthesis of C5 on C6 with ankylosis at C4-C5 and also C2-C3. Straightening of lordosis otherwise. Posterior element alignment remains normal. Skull base and vertebrae: Visualized skull base is intact. No atlanto-occipital dissociation. Osteopenia. No acute osseous abnormality identified. Soft tissues and spinal canal: No prevertebral fluid or swelling. No visible canal hematoma. Calcified carotid atherosclerosis in the neck as stated above. Disc levels: Advanced C1-C2 degeneration including partially calcified ligamentous hypertrophy. Ankylosis of C2-C3 posterior elements on the left and C4-C5 vertebral bodies and right posterior elements. Advanced disc and endplate degeneration at C5-C6 with superimposed mild retrolisthesis. Mild to moderate spinal stenosis at that level. Upper chest: Grossly intact visible upper thoracic levels. Negative visible lung apices. IMPRESSION: 1. Nonspecific abnormal hypodensity in the right inferior temporal gyrus. Differential considerations include infarct, infection, encephalomalacia, and less likely tumor related edema. Recommend  follow-up MRI brain without and with contrast to further characterize. 2. Superimposed Small volume posttraumatic subarachnoid hemorrhage over the convexities. No intracranial mass effect or ventriculomegaly. 3. No other acute traumatic injury to the brain identified. No fracture of the skull, face, or cervical spine identified. 4. Left periorbital superficial scalp hematoma. 5. 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 Electronically Signed: By: Odessa Fleming M.D. On: 10/24/2018 16:28   Ct Maxillofacial Wo Contrast  Addendum Date: 10/24/2018   ADDENDUM REPORT: 10/24/2018 16:33 ADDENDUM: Study discussed by telephone with PA-C Ashante Yellin on 10/24/2018 at 16:32 . Electronically Signed   By: Odessa Fleming M.D.   On: 10/24/2018 16:33   Result Date: 10/24/2018 CLINICAL DATA:  78 year old female with unwitnessed fall. Found in parking lot. Confused. Blunt trauma. EXAM: CT HEAD WITHOUT CONTRAST CT MAXILLOFACIAL WITHOUT CONTRAST CT CERVICAL SPINE WITHOUT CONTRAST TECHNIQUE: Multidetector CT imaging of the head, cervical spine, and maxillofacial structures were performed using the standard protocol without intravenous contrast. Multiplanar CT image reconstructions of the cervical spine and maxillofacial structures were also generated. COMPARISON:  None. FINDINGS: CT HEAD FINDINGS Brain: Small volume subarachnoid hemorrhage over the superolateral hemispheres left greater than right (series 5, image 23). No basilar cistern or intraventricular blood. No subdural or other extra-axial blood identified. No intracranial mass effect. No  ventriculomegaly. Patchy and confluent bilateral cerebral white matter hypodensity, but abnormal gray matter hypodensity in the right inferior temporal gyrus (series 5, image 12 and coronal image 39. No significant mass effect. The mesial right temporal lobe seems to remain normal. No intra-axial blood identified. Dystrophic basal ganglia  calcifications. Vascular: Calcified atherosclerosis at the skull base. No suspicious intracranial vascular hyperdensity. Skull: No calvarium fracture identified. Other: Superficial left periorbital hematoma described below. Other scalp soft tissues are within normal limits. CT MAXILLOFACIAL FINDINGS Osseous: Largely absent dentition. Mandible intact. No maxilla, zygoma, or nasal bone fracture identified. Central skull base appears intact. Orbits: Intact orbital walls. Left superior periorbital superficial scalp hematoma measuring up to 10 millimeters in thickness. Underlying left frontal bone intact. Globes and intraorbital soft tissues appear normal. Sinuses: Well pneumatized. Tympanic cavities and mastoids are clear. Negative nasal cavity aside from leftward septal deviation and trace retained secretions on the right. Soft tissues: Calcified carotid atherosclerosis in the neck. Partially retropharyngeal course of the right carotid. Superimposed postinflammatory calcifications of the palatine tonsils. Negative parapharyngeal spaces, retropharyngeal space, sublingual space, submandibular, parotid, and masticator spaces. No other superficial soft tissue injury identified. No upper cervical lymphadenopathy. CT CERVICAL SPINE FINDINGS Alignment: Degenerative appearing mild retrolisthesis of C5 on C6 with ankylosis at C4-C5 and also C2-C3. Straightening of lordosis otherwise. Posterior element alignment remains normal. Skull base and vertebrae: Visualized skull base is intact. No atlanto-occipital dissociation. Osteopenia. No acute osseous abnormality identified. Soft tissues and spinal canal: No prevertebral fluid or swelling. No visible canal hematoma. Calcified carotid atherosclerosis in the neck as stated above. Disc levels: Advanced C1-C2 degeneration including partially calcified ligamentous hypertrophy. Ankylosis of C2-C3 posterior elements on the left and C4-C5 vertebral bodies and right posterior elements.  Advanced disc and endplate degeneration at C5-C6 with superimposed mild retrolisthesis. Mild to moderate spinal stenosis at that level. Upper chest: Grossly intact visible upper thoracic levels. Negative visible lung apices. IMPRESSION: 1. Nonspecific abnormal hypodensity in the right inferior temporal gyrus. Differential considerations include infarct, infection, encephalomalacia, and less likely tumor related edema. Recommend follow-up MRI brain without and with contrast to further characterize. 2. Superimposed Small volume posttraumatic subarachnoid hemorrhage over the convexities. No intracranial mass effect or ventriculomegaly. 3. No other acute traumatic injury to the brain identified. No fracture of the skull, face, or cervical spine identified. 4. Left periorbital superficial scalp hematoma. 5. 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 Electronically Signed: By: Odessa Fleming M.D. On: 10/24/2018 16:28    Procedures .Marland KitchenLaceration Repair Date/Time: 10/24/2018 5:45 PM Performed by: Siniyah Evangelist, Chase Picket, PA-C Authorized by: Cherokee Clowers, Chase Picket, PA-C   Consent:    Consent obtained:  Verbal   Consent given by:  Patient   Risks discussed:  Pain, infection, poor cosmetic result and poor wound healing Anesthesia (see MAR for exact dosages):    Anesthesia method:  Local infiltration   Local anesthetic:  Lidocaine 2% WITH epi Laceration details:    Location:  Face   Face location:  L eyebrow   Length (cm):  2.5 Repair type:    Repair type:  Simple Pre-procedure details:    Preparation:  Patient was prepped and draped in usual sterile fashion Exploration:    Hemostasis achieved with:  Direct pressure and epinephrine   Wound exploration: entire depth of wound probed and visualized     Wound extent: no foreign bodies/material noted   Treatment:    Area cleansed with:  Saline   Amount  of cleaning:  Standard   Irrigation solution:  Sterile  saline Skin repair:    Repair method:  Sutures   Suture size:  5-0   Wound skin closure material used: Vicryl Rapide.   Suture technique:  Simple interrupted   Number of sutures:  4 Approximation:    Approximation:  Close Post-procedure details:    Dressing:  Antibiotic ointment   Patient tolerance of procedure:  Tolerated well, no immediate complications .Critical Care Performed by: Cerita Rabelo, Chase Picket, PA-C Authorized by: Lynell Greenhouse, Chase Picket, PA-C   Critical care provider statement:    Critical care time (minutes):  45   Critical care was necessary to treat or prevent imminent or life-threatening deterioration of the following conditions:  Trauma (Subarachnoid hemorrhage)   Critical care was time spent personally by me on the following activities:  Discussions with consultants, evaluation of patient's response to treatment, examination of patient, ordering and performing treatments and interventions, ordering and review of laboratory studies, ordering and review of radiographic studies, pulse oximetry, re-evaluation of patient's condition, obtaining history from patient or surrogate and review of old charts   (including critical care time)  Medications Ordered in ED Medications  potassium chloride 10 mEq in 100 mL IVPB (10 mEq Intravenous New Bag/Given 10/24/18 1826)  bacitracin ointment (has no administration in time range)  Tdap (BOOSTRIX) injection 0.5 mL (0.5 mLs Intramuscular Given 10/24/18 1720)  lidocaine-EPINEPHrine (XYLOCAINE W/EPI) 2 %-1:200000 (PF) injection 10 mL (10 mLs Intradermal Given 10/24/18 1827)  sodium chloride 0.9 % bolus 500 mL (500 mLs Intravenous New Bag/Given 10/24/18 1719)     Initial Impression / Assessment and Plan / ED Course  I have reviewed the triage vital signs and the nursing notes.  Pertinent labs & imaging results that were available during my care of the patient were reviewed by me and considered in my medical decision making (see chart for  details).       RIKA DAUGHDRILL is a 78 y.o. female who presents to ED for evaluation after being found down in the [employer] parking lot.  Per her son, she lives alone and is very independent.  Other than hypertension, no major medical issues.  Upon arrival, patient has obvious trauma to the left side of the face with laceration.  Tdap updated.  Laceration repaired as dictated above.  Given unwitnessed fall, thorough work-up including EKG, labs, UA and CT imaging will be obtained.   EKG shows A. fib which is new.  Heart rate in the 80s to 90s.  Patient and her son both deny any history of atrial fibrillation or blood thinner use.  CMP with notable hyponatremia, hypokalemia and hypochloremia.  Anion gap of 18.  Given small bolus of fluids and potassium runs.  CT head does show small volume posttraumatic subarachnoid hemorrhage with no mass-effect.  Also shows a nonspecific hypodensity of the right inferior temporal gyrus.  I spoke with radiology and they feel this could be a new infarct, but also could be an old infarct, edema related to a tumor.   5:22 PM - Consulted neurosurgery, Dr. Danielle Dess, who has independently reviewed imaging. Does not feel emergent neurosurgical intervention is needed at this time. Agrees with MRI for further evaluation - feels that stroke is highly likely. Recommends further stroke workup. Recommends medicine admission. Neurosurgery will follow in consultation. He will see first thing in the morning, possibly tonight if he comes to the hospital.   Hospitalist consulted who will admit.    PATIENT'S  SON WOULD LIKE TO BE UPDATED ON CARE IF ANY CHANGES OCCUR AND/OR IN THE MORNING. I HAVE UPDATED HIM ON RESULTS FROM ED WORKUP AND HE IS AWARE OF PATIENT'S CONDITION. HE CAN BE REACHED AT [phone].   Patient seen by and discussed with Dr. Rush Landmark who agrees with treatment plan.   Final Clinical Impressions(s) / ED Diagnoses   Final diagnoses:  Subarachnoid bleed (HCC)   Facial laceration, initial encounter  Hypokalemia  Hyponatremia    ED Discharge Orders    None       Neria Procter, Chase Picket, PA-C 10/24/18 1834    Tegeler, Canary Brim, MD 10/25/18 2253

## 2018-10-24 NOTE — ED Notes (Signed)
ED TO INPATIENT HANDOFF REPORT  ED Nurse Name and Phone #: Leatha Gilding [phone]  S Name/Age/Gender Bartholomew Boards 78 y.o. female Room/Bed: 027C/027C  Code Status   Code Status: Not on file  Home/SNF/Other Home Patient oriented to: self, place and time Is this baseline? No   Triage Complete: Triage complete  Chief Complaint Fall; Face Injury  Triage Note [facility]- pt found in [employer] parking lot. Unwitnessed fall. Pt was alert but confused on day. Pt has lac to left eye. Stroke screen was negative. Denies neck or back pain.    170/98 70 HR 98% RA 16 RR   Allergies No Known Allergies  Level of Care/Admitting Diagnosis ED Disposition    ED Disposition Condition Comment   Admit  Hospital Area: [facility] [[id number]]  Level of Care: Progressive [102]  Covid Evaluation: N/A  Diagnosis: SAH (subarachnoid hemorrhage) (HCC) [454098]  Admitting Physician: Rometta Emery [[id number]]  Attending Physician: Rometta Emery [[id number]]  Estimated length of stay: past midnight tomorrow  Certification:: I certify this patient will need inpatient services for at least 2 midnights  PT Class (Do Not Modify): Inpatient [101]  PT Acc Code (Do Not Modify): Private [1]       B Medical/Surgery History Past Medical History:  Diagnosis Date  . Hypertension    History reviewed. No pertinent surgical history.   A IV Location/Drains/Wounds Patient Lines/Drains/Airways Status   Active Line/Drains/Airways    Name:   Placement date:   Placement time:   Site:   Days:   Peripheral IV 10/24/18 Right Antecubital   10/24/18    1833    Antecubital   less than 1          Intake/Output Last 24 hours No intake or output data in the 24 hours ending 10/24/18 1903  Labs/Imaging Results for orders placed or performed during the hospital encounter of 10/24/18 (from the past 48 hour(s))  Comprehensive metabolic panel     Status: Abnormal   Collection Time: 10/24/18  3:45 PM   Result Value Ref Range   Sodium 124 (L) 135 - 145 mmol/L   Potassium 2.8 (L) 3.5 - 5.1 mmol/L   Chloride 81 (L) 98 - 111 mmol/L   CO2 25 22 - 32 mmol/L   Glucose, Bld 97 70 - 99 mg/dL   BUN 15 8 - 23 mg/dL   Creatinine, Ser 1.19 0.44 - 1.00 mg/dL   Calcium 9.1 8.9 - 14.7 mg/dL   Total Protein 6.8 6.5 - 8.1 g/dL   Albumin 3.7 3.5 - 5.0 g/dL   AST 29 15 - 41 U/L   ALT 16 0 - 44 U/L   Alkaline Phosphatase 70 38 - 126 U/L   Total Bilirubin 1.1 0.3 - 1.2 mg/dL   GFR calc non Af Amer >60 >60 mL/min   GFR calc Af Amer >60 >60 mL/min   Anion gap 18 (H) 5 - 15    Comment: Performed at [facility], [address], [place], [place] [address]  CBC with Differential     Status: Abnormal   Collection Time: 10/24/18  3:45 PM  Result Value Ref Range   WBC 6.7 4.0 - 10.5 K/uL   RBC 4.55 3.87 - 5.11 MIL/uL   Hemoglobin 14.2 12.0 - 15.0 g/dL   HCT 21.3 08.6 - 57.8 %   MCV 86.4 80.0 - 100.0 fL   MCH 31.2 26.0 - 34.0 pg   MCHC 36.1 (H) 30.0 - 36.0  g/dL   RDW 96.0 45.4 - 09.8 %   Platelets 264 150 - 400 K/uL   nRBC 0.0 0.0 - 0.2 %   Neutrophils Relative % 75 %   Neutro Abs 5.0 1.7 - 7.7 K/uL   Lymphocytes Relative 14 %   Lymphs Abs 1.0 0.7 - 4.0 K/uL   Monocytes Relative 10 %   Monocytes Absolute 0.7 0.1 - 1.0 K/uL   Eosinophils Relative 1 %   Eosinophils Absolute 0.1 0.0 - 0.5 K/uL   Basophils Relative 0 %   Basophils Absolute 0.0 0.0 - 0.1 K/uL   Immature Granulocytes 0 %   Abs Immature Granulocytes 0.02 0.00 - 0.07 K/uL    Comment: Performed at [facility], [address] [place], [place] [address]  Troponin I - Once     Status: None   Collection Time: 10/24/18  3:45 PM  Result Value Ref Range   Troponin I <0.03 <0.03 ng/mL    Comment: Performed at [facility], [address], [place], [place] [address]  Urinalysis, Routine w reflex microscopic     Status: Abnormal   Collection Time: 10/24/18  4:30 PM  Result Value Ref Range   Color, Urine YELLOW YELLOW    APPearance CLEAR CLEAR   Specific Gravity, Urine 1.008 1.005 - 1.030   pH 6.0 5.0 - 8.0   Glucose, UA NEGATIVE NEGATIVE mg/dL   Hgb urine dipstick SMALL (A) NEGATIVE   Bilirubin Urine NEGATIVE NEGATIVE   Ketones, ur 5 (A) NEGATIVE mg/dL   Protein, ur NEGATIVE NEGATIVE mg/dL   Nitrite NEGATIVE NEGATIVE   Leukocytes,Ua TRACE (A) NEGATIVE   RBC / HPF 0-5 0 - 5 RBC/hpf   WBC, UA 11-20 0 - 5 WBC/hpf   Bacteria, UA RARE (A) NONE SEEN   Squamous Epithelial / LPF 0-5 0 - 5    Comment: Performed at [facility], [address] [place], [place] [address]   Ct Head Wo Contrast  Addendum Date: 10/24/2018   ADDENDUM REPORT: 10/24/2018 16:33 ADDENDUM: Study discussed by telephone with PA-C JAIME WARD on 10/24/2018 at 16:32 . Electronically Signed   By: Odessa Fleming M.D.   On: 10/24/2018 16:33   Result Date: 10/24/2018 CLINICAL DATA:  78 year old female with unwitnessed fall. Found in parking lot. Confused. Blunt trauma. EXAM: CT HEAD WITHOUT CONTRAST CT MAXILLOFACIAL WITHOUT CONTRAST CT CERVICAL SPINE WITHOUT CONTRAST TECHNIQUE: Multidetector CT imaging of the head, cervical spine, and maxillofacial structures were performed using the standard protocol without intravenous contrast. Multiplanar CT image reconstructions of the cervical spine and maxillofacial structures were also generated. COMPARISON:  None. FINDINGS: CT HEAD FINDINGS Brain: Small volume subarachnoid hemorrhage over the superolateral hemispheres left greater than right (series 5, image 23). No basilar cistern or intraventricular blood. No subdural or other extra-axial blood identified. No intracranial mass effect. No ventriculomegaly. Patchy and confluent bilateral cerebral white matter hypodensity, but abnormal gray matter hypodensity in the right inferior temporal gyrus (series 5, image 12 and coronal image 39. No significant mass effect. The mesial right temporal lobe seems to remain normal. No intra-axial blood identified. Dystrophic basal  ganglia calcifications. Vascular: Calcified atherosclerosis at the skull base. No suspicious intracranial vascular hyperdensity. Skull: No calvarium fracture identified. Other: Superficial left periorbital hematoma described below. Other scalp soft tissues are within normal limits. CT MAXILLOFACIAL FINDINGS Osseous: Largely absent dentition. Mandible intact. No maxilla, zygoma, or nasal bone fracture identified. Central skull base appears intact. Orbits: Intact orbital walls. Left  superior periorbital superficial scalp hematoma measuring up to 10 millimeters in thickness. Underlying left frontal bone intact. Globes and intraorbital soft tissues appear normal. Sinuses: Well pneumatized. Tympanic cavities and mastoids are clear. Negative nasal cavity aside from leftward septal deviation and trace retained secretions on the right. Soft tissues: Calcified carotid atherosclerosis in the neck. Partially retropharyngeal course of the right carotid. Superimposed postinflammatory calcifications of the palatine tonsils. Negative parapharyngeal spaces, retropharyngeal space, sublingual space, submandibular, parotid, and masticator spaces. No other superficial soft tissue injury identified. No upper cervical lymphadenopathy. CT CERVICAL SPINE FINDINGS Alignment: Degenerative appearing mild retrolisthesis of C5 on C6 with ankylosis at C4-C5 and also C2-C3. Straightening of lordosis otherwise. Posterior element alignment remains normal. Skull base and vertebrae: Visualized skull base is intact. No atlanto-occipital dissociation. Osteopenia. No acute osseous abnormality identified. Soft tissues and spinal canal: No prevertebral fluid or swelling. No visible canal hematoma. Calcified carotid atherosclerosis in the neck as stated above. Disc levels: Advanced C1-C2 degeneration including partially calcified ligamentous hypertrophy. Ankylosis of C2-C3 posterior elements on the left and C4-C5 vertebral bodies and right posterior  elements. Advanced disc and endplate degeneration at C5-C6 with superimposed mild retrolisthesis. Mild to moderate spinal stenosis at that level. Upper chest: Grossly intact visible upper thoracic levels. Negative visible lung apices. IMPRESSION: 1. Nonspecific abnormal hypodensity in the right inferior temporal gyrus. Differential considerations include infarct, infection, encephalomalacia, and less likely tumor related edema. Recommend follow-up MRI brain without and with contrast to further characterize. 2. Superimposed Small volume posttraumatic subarachnoid hemorrhage over the convexities. No intracranial mass effect or ventriculomegaly. 3. No other acute traumatic injury to the brain identified. No fracture of the skull, face, or cervical spine identified. 4. Left periorbital superficial scalp hematoma. 5. 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 Electronically Signed: By: Odessa Fleming M.D. On: 10/24/2018 16:28   Ct Cervical Spine Wo Contrast  Addendum Date: 10/24/2018   ADDENDUM REPORT: 10/24/2018 16:33 ADDENDUM: Study discussed by telephone with PA-C JAIME WARD on 10/24/2018 at 16:32 . Electronically Signed   By: Odessa Fleming M.D.   On: 10/24/2018 16:33   Result Date: 10/24/2018 CLINICAL DATA:  78 year old female with unwitnessed fall. Found in parking lot. Confused. Blunt trauma. EXAM: CT HEAD WITHOUT CONTRAST CT MAXILLOFACIAL WITHOUT CONTRAST CT CERVICAL SPINE WITHOUT CONTRAST TECHNIQUE: Multidetector CT imaging of the head, cervical spine, and maxillofacial structures were performed using the standard protocol without intravenous contrast. Multiplanar CT image reconstructions of the cervical spine and maxillofacial structures were also generated. COMPARISON:  None. FINDINGS: CT HEAD FINDINGS Brain: Small volume subarachnoid hemorrhage over the superolateral hemispheres left greater than right (series 5, image 23). No basilar cistern or  intraventricular blood. No subdural or other extra-axial blood identified. No intracranial mass effect. No ventriculomegaly. Patchy and confluent bilateral cerebral white matter hypodensity, but abnormal gray matter hypodensity in the right inferior temporal gyrus (series 5, image 12 and coronal image 39. No significant mass effect. The mesial right temporal lobe seems to remain normal. No intra-axial blood identified. Dystrophic basal ganglia calcifications. Vascular: Calcified atherosclerosis at the skull base. No suspicious intracranial vascular hyperdensity. Skull: No calvarium fracture identified. Other: Superficial left periorbital hematoma described below. Other scalp soft tissues are within normal limits. CT MAXILLOFACIAL FINDINGS Osseous: Largely absent dentition. Mandible intact. No maxilla, zygoma, or nasal bone fracture identified. Central skull base appears intact. Orbits: Intact orbital walls. Left superior periorbital superficial scalp hematoma measuring up to 10 millimeters in thickness. Underlying  left frontal bone intact. Globes and intraorbital soft tissues appear normal. Sinuses: Well pneumatized. Tympanic cavities and mastoids are clear. Negative nasal cavity aside from leftward septal deviation and trace retained secretions on the right. Soft tissues: Calcified carotid atherosclerosis in the neck. Partially retropharyngeal course of the right carotid. Superimposed postinflammatory calcifications of the palatine tonsils. Negative parapharyngeal spaces, retropharyngeal space, sublingual space, submandibular, parotid, and masticator spaces. No other superficial soft tissue injury identified. No upper cervical lymphadenopathy. CT CERVICAL SPINE FINDINGS Alignment: Degenerative appearing mild retrolisthesis of C5 on C6 with ankylosis at C4-C5 and also C2-C3. Straightening of lordosis otherwise. Posterior element alignment remains normal. Skull base and vertebrae: Visualized skull base is intact. No  atlanto-occipital dissociation. Osteopenia. No acute osseous abnormality identified. Soft tissues and spinal canal: No prevertebral fluid or swelling. No visible canal hematoma. Calcified carotid atherosclerosis in the neck as stated above. Disc levels: Advanced C1-C2 degeneration including partially calcified ligamentous hypertrophy. Ankylosis of C2-C3 posterior elements on the left and C4-C5 vertebral bodies and right posterior elements. Advanced disc and endplate degeneration at C5-C6 with superimposed mild retrolisthesis. Mild to moderate spinal stenosis at that level. Upper chest: Grossly intact visible upper thoracic levels. Negative visible lung apices. IMPRESSION: 1. Nonspecific abnormal hypodensity in the right inferior temporal gyrus. Differential considerations include infarct, infection, encephalomalacia, and less likely tumor related edema. Recommend follow-up MRI brain without and with contrast to further characterize. 2. Superimposed Small volume posttraumatic subarachnoid hemorrhage over the convexities. No intracranial mass effect or ventriculomegaly. 3. No other acute traumatic injury to the brain identified. No fracture of the skull, face, or cervical spine identified. 4. Left periorbital superficial scalp hematoma. 5. 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 Electronically Signed: By: Odessa Fleming M.D. On: 10/24/2018 16:28   Ct Maxillofacial Wo Contrast  Addendum Date: 10/24/2018   ADDENDUM REPORT: 10/24/2018 16:33 ADDENDUM: Study discussed by telephone with PA-C JAIME WARD on 10/24/2018 at 16:32 . Electronically Signed   By: Odessa Fleming M.D.   On: 10/24/2018 16:33   Result Date: 10/24/2018 CLINICAL DATA:  78 year old female with unwitnessed fall. Found in parking lot. Confused. Blunt trauma. EXAM: CT HEAD WITHOUT CONTRAST CT MAXILLOFACIAL WITHOUT CONTRAST CT CERVICAL SPINE WITHOUT CONTRAST TECHNIQUE: Multidetector CT imaging of the head,  cervical spine, and maxillofacial structures were performed using the standard protocol without intravenous contrast. Multiplanar CT image reconstructions of the cervical spine and maxillofacial structures were also generated. COMPARISON:  None. FINDINGS: CT HEAD FINDINGS Brain: Small volume subarachnoid hemorrhage over the superolateral hemispheres left greater than right (series 5, image 23). No basilar cistern or intraventricular blood. No subdural or other extra-axial blood identified. No intracranial mass effect. No ventriculomegaly. Patchy and confluent bilateral cerebral white matter hypodensity, but abnormal gray matter hypodensity in the right inferior temporal gyrus (series 5, image 12 and coronal image 39. No significant mass effect. The mesial right temporal lobe seems to remain normal. No intra-axial blood identified. Dystrophic basal ganglia calcifications. Vascular: Calcified atherosclerosis at the skull base. No suspicious intracranial vascular hyperdensity. Skull: No calvarium fracture identified. Other: Superficial left periorbital hematoma described below. Other scalp soft tissues are within normal limits. CT MAXILLOFACIAL FINDINGS Osseous: Largely absent dentition. Mandible intact. No maxilla, zygoma, or nasal bone fracture identified. Central skull base appears intact. Orbits: Intact orbital walls. Left superior periorbital superficial scalp hematoma measuring up to 10 millimeters in thickness. Underlying left frontal bone intact. Globes and intraorbital soft tissues appear normal. Sinuses: Well pneumatized.  Tympanic cavities and mastoids are clear. Negative nasal cavity aside from leftward septal deviation and trace retained secretions on the right. Soft tissues: Calcified carotid atherosclerosis in the neck. Partially retropharyngeal course of the right carotid. Superimposed postinflammatory calcifications of the palatine tonsils. Negative parapharyngeal spaces, retropharyngeal space,  sublingual space, submandibular, parotid, and masticator spaces. No other superficial soft tissue injury identified. No upper cervical lymphadenopathy. CT CERVICAL SPINE FINDINGS Alignment: Degenerative appearing mild retrolisthesis of C5 on C6 with ankylosis at C4-C5 and also C2-C3. Straightening of lordosis otherwise. Posterior element alignment remains normal. Skull base and vertebrae: Visualized skull base is intact. No atlanto-occipital dissociation. Osteopenia. No acute osseous abnormality identified. Soft tissues and spinal canal: No prevertebral fluid or swelling. No visible canal hematoma. Calcified carotid atherosclerosis in the neck as stated above. Disc levels: Advanced C1-C2 degeneration including partially calcified ligamentous hypertrophy. Ankylosis of C2-C3 posterior elements on the left and C4-C5 vertebral bodies and right posterior elements. Advanced disc and endplate degeneration at C5-C6 with superimposed mild retrolisthesis. Mild to moderate spinal stenosis at that level. Upper chest: Grossly intact visible upper thoracic levels. Negative visible lung apices. IMPRESSION: 1. Nonspecific abnormal hypodensity in the right inferior temporal gyrus. Differential considerations include infarct, infection, encephalomalacia, and less likely tumor related edema. Recommend follow-up MRI brain without and with contrast to further characterize. 2. Superimposed Small volume posttraumatic subarachnoid hemorrhage over the convexities. No intracranial mass effect or ventriculomegaly. 3. No other acute traumatic injury to the brain identified. No fracture of the skull, face, or cervical spine identified. 4. Left periorbital superficial scalp hematoma. 5. 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 Electronically Signed: By: Odessa Fleming M.D. On: 10/24/2018 16:28    Pending Labs Unresulted Labs (From admission, onward)    Start     Ordered   10/24/18 1820   SARS Coronavirus 2 (CEPHEID - Performed in [facility] hospital lab), Hosp Order  (Asymptomatic Patients Labs)  Once,   R    Question:  Rule Out  Answer:  Yes   10/24/18 1819   Signed and Held  Comprehensive metabolic panel  Tomorrow morning,   R     Signed and Held   Signed and Held  CBC  Tomorrow morning,   R     Signed and Held          Vitals/Pain Today's Vitals   10/24/18 1512 10/24/18 1530 10/24/18 1612 10/24/18 1630  BP:  (!) 168/97  (!) 173/89  Pulse: 93 79 (!) 105 86  Resp: 16 14 (!) 26 (!) 21  Temp: 97.9 F (36.6 C)     TempSrc: Oral     SpO2: 98% 99% 97% 98%  Weight:      Height:      PainSc:        Isolation Precautions No active isolations  Medications Medications  potassium chloride 10 mEq in 100 mL IVPB (10 mEq Intravenous New Bag/Given 10/24/18 1826)  bacitracin ointment (has no administration in time range)  Tdap (BOOSTRIX) injection 0.5 mL (0.5 mLs Intramuscular Given 10/24/18 1720)  lidocaine-EPINEPHrine (XYLOCAINE W/EPI) 2 %-1:200000 (PF) injection 10 mL (10 mLs Intradermal Given 10/24/18 1827)  sodium chloride 0.9 % bolus 500 mL (500 mLs Intravenous New Bag/Given 10/24/18 1719)    Mobility walks Moderate fall risk   Focused Assessments Neuro Assessment Handoff:  Swallow screen pass? Yes    NIH Stroke Scale ( + Modified Stroke Scale Criteria)  Interval: Initial Level of Consciousness (  1a.)   : Alert, keenly responsive LOC Questions (1b. )   +: Answers both questions correctly LOC Commands (1c. )   + : Performs both tasks correctly Best Gaze (2. )  +: Normal Visual (3. )  +: No visual loss Facial Palsy (4. )    : Normal symmetrical movements Motor Arm, Left (5a. )   +: No drift Motor Arm, Right (5b. )   +: No drift Motor Leg, Left (6a. )   +: No drift Motor Leg, Right (6b. )   +: No drift Limb Ataxia (7. ): Absent Sensory (8. )   +: Normal, no sensory loss Best Language (9. )   +: No aphasia Dysarthria (10. ):  Normal Extinction/Inattention (11.)   +: No Abnormality Modified SS Total  +: 0 Complete NIHSS TOTAL: 0     Neuro Assessment:   Neuro Checks:   Initial (10/24/18 1511)  Last Documented NIHSS Modified Score: 0 (10/24/18 1511) Has TPA been given? No If patient is a Neuro Trauma and patient is going to OR before floor call report to 4N Charge nurse: [phone] or [phone]     R Recommendations: See Admitting Provider Note  Report given to:   Additional Notes:

## 2018-10-24 NOTE — ED Notes (Signed)
Suture cart at bedside 

## 2018-10-25 ENCOUNTER — Encounter (HOSPITAL_COMMUNITY): Payer: Self-pay | Admitting: Neurology

## 2018-10-25 ENCOUNTER — Inpatient Hospital Stay (HOSPITAL_COMMUNITY): Payer: Medicare Other

## 2018-10-25 DIAGNOSIS — I639 Cerebral infarction, unspecified: Secondary | ICD-10-CM

## 2018-10-25 DIAGNOSIS — E876 Hypokalemia: Secondary | ICD-10-CM

## 2018-10-25 DIAGNOSIS — S0181XA Laceration without foreign body of other part of head, initial encounter: Secondary | ICD-10-CM

## 2018-10-25 DIAGNOSIS — Y92009 Unspecified place in unspecified non-institutional (private) residence as the place of occurrence of the external cause: Secondary | ICD-10-CM

## 2018-10-25 DIAGNOSIS — I4891 Unspecified atrial fibrillation: Secondary | ICD-10-CM

## 2018-10-25 DIAGNOSIS — I10 Essential (primary) hypertension: Secondary | ICD-10-CM

## 2018-10-25 DIAGNOSIS — W19XXXA Unspecified fall, initial encounter: Secondary | ICD-10-CM

## 2018-10-25 LAB — HEMOGLOBIN A1C
Hgb A1c MFr Bld: 5 % (ref 4.8–5.6)
Mean Plasma Glucose: 96.8 mg/dL

## 2018-10-25 LAB — CBC
HCT: 35.8 % — ABNORMAL LOW (ref 36.0–46.0)
Hemoglobin: 13 g/dL (ref 12.0–15.0)
MCH: 31.7 pg (ref 26.0–34.0)
MCHC: 36.3 g/dL — ABNORMAL HIGH (ref 30.0–36.0)
MCV: 87.3 fL (ref 80.0–100.0)
Platelets: 232 10*3/uL (ref 150–400)
RBC: 4.1 MIL/uL (ref 3.87–5.11)
RDW: 12.4 % (ref 11.5–15.5)
WBC: 7.7 10*3/uL (ref 4.0–10.5)
nRBC: 0 % (ref 0.0–0.2)

## 2018-10-25 LAB — COMPREHENSIVE METABOLIC PANEL
ALT: 14 U/L (ref 0–44)
AST: 26 U/L (ref 15–41)
Albumin: 3.1 g/dL — ABNORMAL LOW (ref 3.5–5.0)
Alkaline Phosphatase: 57 U/L (ref 38–126)
Anion gap: 13 (ref 5–15)
BUN: 11 mg/dL (ref 8–23)
CO2: 25 mmol/L (ref 22–32)
Calcium: 8.8 mg/dL — ABNORMAL LOW (ref 8.9–10.3)
Chloride: 92 mmol/L — ABNORMAL LOW (ref 98–111)
Creatinine, Ser: 0.62 mg/dL (ref 0.44–1.00)
GFR calc Af Amer: >60 mL/min (ref >60)
GFR calc non Af Amer: >60 mL/min (ref >60)
Glucose, Bld: 80 mg/dL (ref 70–99)
Potassium: 2.7 mmol/L — CL (ref 3.5–5.1)
Sodium: 130 mmol/L — ABNORMAL LOW (ref 135–145)
Total Bilirubin: 1.3 mg/dL — ABNORMAL HIGH (ref 0.3–1.2)
Total Protein: 6 g/dL — ABNORMAL LOW (ref 6.5–8.1)

## 2018-10-25 LAB — ECHOCARDIOGRAM LIMITED BUBBLE STUDY
Height: 72 in
Weight: 2080 oz

## 2018-10-25 LAB — MAGNESIUM: Magnesium: 1.7 mg/dL (ref 1.7–2.4)

## 2018-10-25 LAB — POTASSIUM: Potassium: 3.2 mmol/L — ABNORMAL LOW (ref 3.5–5.1)

## 2018-10-25 MED ORDER — POTASSIUM CHLORIDE 10 MEQ/100ML IV SOLN
10.0000 meq | INTRAVENOUS | Status: AC
  Administered 2018-10-25 (×2): 10 meq via INTRAVENOUS
  Filled 2018-10-25 (×2): qty 100

## 2018-10-25 MED ORDER — POTASSIUM CHLORIDE 10 MEQ/100ML IV SOLN
10.0000 meq | INTRAVENOUS | Status: AC
  Administered 2018-10-25 (×3): 10 meq via INTRAVENOUS
  Filled 2018-10-25 (×2): qty 100

## 2018-10-25 NOTE — Progress Notes (Addendum)
RN notified by Tele, pt had 2.07 second pause. MD notified. Nurse will continue to monitor.

## 2018-10-25 NOTE — Progress Notes (Signed)
PROGRESS NOTE    Bartholomew BoardsLoretta J Umphlett  JXB:[id number] DOB: 10/06/1940 DOA: 10/24/2018 PCP: [name], [name], MD   Brief Narrative:  78 y.o. WF PMHx HTN   who was found down at [employer] parking lot after sustaining a fall that was not witnessed.  A bystander called 911 and patient was brought to the ER.  She was found to have blood all over the left side of her head and scalp.  Patient is awake now but has no recollection of what happened.  She has no recollection of going to the [employer] store.  She lives alone at home and according to her son she was supposed to have gone to get grocery for a Mother's Day meal that they were supposed to have later in the day.  She usually does that alone.  She takes care of herself without much assistance.  Patient is found to have a small subarachnoid hemorrhage, new onset A. fib as well as findings on her CT suggestive of possible CVA.  She is being admitted to the hospital for evaluation and treatment.  She is still unable to give me any adequate history.  She responds to questions but has completely lost memory of what happened earlier today..   ED Course: Temperature is 97.9 blood pressure 174/95 pulse 105 respiratory rate of 26 oxygen sats 97% on room air.  She has a white count of 6.7 hemoglobin 14.2 and platelets 264.  Sodium 124 potassium 2.8 and chloride 81.  Rest of the chemistry appears to be within normal.  Urinalysis essentially negative except for white count of 11.  Head CT cervical spine and maxillofacial showed nonspecific abnormal hypodensity in the right inferior temporal gyrus.  This include an infarct infection encephalomalacia and less likely tumor related edema.  Also small volume posttraumatic subarachnoid hemorrhage over the convexities.  No mass-effect.  Left periorbital scalp hematoma is noted.  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  Neurosurgery Dr. Danielle DessElsner consulted and patient is being admitted to the  hospital for work-up.    Subjective: 5/11 A/O x1 (does not know where, when, why.  Can follow some commands.  Negative CP, negative headache, negative S OB, negative abdominal pain   Assessment & Plan:   Principal Problem:   SAH (subarachnoid hemorrhage) (HCC) Active Problems:   Fall at home, initial encounter   HTN (hypertension)   New onset a-fib (HCC)   CVA (cerebral vascular accident) (HCC)   Hyponatremia   Hypokalemia  Subarachnoid hemorrhage - Hold all anticoagulants - CVA which led to syncope?  Subacute/chronic infarct - Stroke team contacted will await their recommendations  S/p fall - Patient without any recollection of incident in [employer] parking lot. - Perseveration  Atrial fibrillation (new) - Per chart son stated to admitting team no prior history - On anticoagulants on hold - Currently NSR - Echocardiogram negative CHF see results below   Essential HTN - Amlodipine 5 mg daily -Atenolol 100 mg daily - Lisinopril 20 mg daily -HCTZ 12.5 mg daily   Hyponatremia mild  - Not suggestive of SIADH - Monitor closely  Hypokalemia -Potassium goal>4 - Potassium IV 60 mEq - Repeat K/Mg 1500   Goals of care - Consult PT/OT on 5/12  Goals of care -5/11 PT/OT consult placed: Patient with SAH, subacute/chronic infarct evaluate for CIR vs SNF   DVT prophylaxis: SCD Code Status: Full Family Communication: None Disposition Plan: TBD   Consultants:  Neurology  Procedures/Significant Events:  5/10 CT head/CT C-spine:-Nonspecific abnormal hypodensity in the right inferior temporal gyrus. Differential considerations include infarct, infection, encephalomalacia, and less likely tumor related edema. -Superimposed Small volume posttraumatic SAH over the convexities. No intracranial mass effect or ventriculomegaly. - Left periorbital superficial scalp hematoma. -C-spine degeneration and ankylosis with mild to moderate degenerative spinal stenosis at  C5-C6. 5/10 MRI brain W/W. Wo contrast: -No acute ischemia. -Unchanged trace subarachnoid hemorrhage over both convexities in a pattern consistent with trauma. -Large late subacute or early chronic infarct. -4. Advanced atrophy and severe chronic ischemic white matter changes. 5/11 echocardiogram with bubble study:  LVEF=:  60-65%.  -Interatrial Septum: No atrial level shunt detected by color flow Doppler. Agitated saline contrast was given intravenously to evaluate for intracardiac shunting. No evidence of right to left shunting across the atrial septum by bubble contrast study. -Pericardium: There is no evidence of pericardial effusion. 5/11 EEG:-consistent with a generalized non-specific cerebral dysfunction (encephalopathy). - Negative seizure   I have personally reviewed and interpreted all radiology studies and my findings are as above.  VENTILATOR SETTINGS:    Cultures   Antimicrobials: Anti-infectives (From admission, onward)   None      Devices    LINES / TUBES:      Continuous Infusions:  sodium chloride 75 mL/hr at 10/24/18 2029   potassium chloride 10 mEq (10/25/18 0651)     Objective: Vitals:   10/24/18 2042 10/24/18 2330 10/25/18 0426 10/25/18 0745  BP: (!) 130/98 135/80 140/76 126/74  Pulse: 90 74 67 63  Resp: Temp: 98.2 F (36.8 C) 97.6 F (36.4 C) 97.8 F (36.6 C) 98.1 F (36.7 C)  TempSrc: Oral Oral Oral Axillary  SpO2: 100% (!) 87% 99% 98%  Weight:      Height:       No intake or output data in the 24 hours ending 10/25/18 0906 Filed Weights   10/24/18 1509  Weight: 59 kg    Examination:  General: A/O x1 (does not know where, when, why no acute respiratory distress, cachectic Eyes: negative scleral hemorrhage, negative anisocoria, negative icterus, large hematoma over left eye and cheek painful to palpation ENT: Negative Runny nose, negative gingival bleeding, Neck:  Negative scars, masses, torticollis,  lymphadenopathy, JVD Lungs: Clear to auscultation bilaterally without wheezes or crackles Cardiovascular: Regular rate and rhythm without murmur gallop or rub normal S1 and S2 Abdomen: negative abdominal pain, nondistended, positive soft, bowel sounds, no rebound, no ascites, no appreciable mass Extremities: No significant cyanosis, clubbing, or edema bilateral lower extremities Skin: Patient with multiple bruises on her face arms left eye hematoma Psychiatric: Unable to assess secondary to altered mental status  Central nervous system:  Cranial nerves II through XII intact, tongue/uvula midline, all extremities muscle strength 4/5, sensation intact throughout, unable to perform finger nose finger bilateral  quick finger touch bilateral, unable to perform heel to shin bilateral , negative dysarthria, positive expressive aphasia, negative receptive aphasia.  .     Data Reviewed: Care during the described time interval was provided by me .  I have reviewed this patient's available data, including medical history, events of note, physical examination, and all test results as part of my evaluation.   CBC: Recent Labs  Lab 10/24/18 1545 10/25/18 0331  WBC 6.7 7.7  NEUTROABS 5.0  --   HGB 14.2 13.0  HCT 39.3 35.8*  MCV 86.4 87.3  PLT 264 232   Basic Metabolic Panel: Recent Labs  Lab  10/24/18 1545 10/25/18 0331  NA 124* 130*  K 2.8* 2.7*  CL 81* 92*  CO2 25 25  GLUCOSE 97 80  BUN 15 11  CREATININE 0.64 0.62  CALCIUM 9.1 8.8*   GFR: Estimated Creatinine Clearance: 53.1 mL/min (by C-G formula based on SCr of 0.62 mg/dL). Liver Function Tests: Recent Labs  Lab 10/24/18 1545 10/25/18 0331  AST 29 26  ALT 16 14  ALKPHOS 70 57  BILITOT 1.1 1.3*  PROT 6.8 6.0*  ALBUMIN 3.7 3.1*   No results for input(s): LIPASE, AMYLASE in the last 168 hours. No results for input(s): AMMONIA in the last 168 hours. Coagulation Profile: No results for input(s): INR, PROTIME in the last 168  hours. Cardiac Enzymes: Recent Labs  Lab 10/24/18 1545  TROPONINI <0.03   BNP (last 3 results) No results for input(s): PROBNP in the last 8760 hours. HbA1C: No results for input(s): HGBA1C in the last 72 hours. CBG: No results for input(s): GLUCAP in the last 168 hours. Lipid Profile: No results for input(s): CHOL, HDL, LDLCALC, TRIG, CHOLHDL, LDLDIRECT in the last 72 hours. Thyroid Function Tests: No results for input(s): TSH, T4TOTAL, FREET4, T3FREE, THYROIDAB in the last 72 hours. Anemia Panel: No results for input(s): VITAMINB12, FOLATE, FERRITIN, TIBC, IRON, RETICCTPCT in the last 72 hours. Urine analysis:    Component Value Date/Time   COLORURINE YELLOW 10/24/2018 1630   APPEARANCEUR CLEAR 10/24/2018 1630   LABSPEC 1.008 10/24/2018 1630   PHURINE 6.0 10/24/2018 1630   GLUCOSEU NEGATIVE 10/24/2018 1630   HGBUR SMALL (A) 10/24/2018 1630   BILIRUBINUR NEGATIVE 10/24/2018 1630   KETONESUR 5 (A) 10/24/2018 1630   PROTEINUR NEGATIVE 10/24/2018 1630   UROBILINOGEN 1.0 09/25/2011 0635   NITRITE NEGATIVE 10/24/2018 1630   LEUKOCYTESUR TRACE (A) 10/24/2018 1630   Sepsis Labs: (procalcitonin:4,lacticidven:4)  ) Recent Results (from the past 240 hour(s))  SARS Coronavirus 2 (CEPHEID - Performed in [facility] hospital lab), Hosp Order     Status: None   Collection Time: 10/24/18  7:17 PM  Result Value Ref Range Status   SARS Coronavirus 2 NEGATIVE NEGATIVE Final    Comment: (NOTE) If result is NEGATIVE SARS-CoV-2 target nucleic acids are NOT DETECTED. The SARS-CoV-2 RNA is generally detectable in upper and lower  respiratory specimens during the acute phase of infection. The lowest  concentration of SARS-CoV-2 viral copies this assay can detect is 250  copies / mL. A negative result does not preclude SARS-CoV-2 infection  and should not be used as the sole basis for treatment or other  patient management decisions.  A negative result may occur with  improper  specimen collection / handling, submission of specimen other  than nasopharyngeal swab, presence of viral mutation(s) within the  areas targeted by this assay, and inadequate number of viral copies  (<250 copies / mL). A negative result must be combined with clinical  observations, patient history, and epidemiological information. If result is POSITIVE SARS-CoV-2 target nucleic acids are DETECTED. The SARS-CoV-2 RNA is generally detectable in upper and lower  respiratory specimens dur ing the acute phase of infection.  Positive  results are indicative of active infection with SARS-CoV-2.  Clinical  correlation with patient history and other diagnostic information is  necessary to determine patient infection status.  Positive results do  not rule out bacterial infection or co-infection with other viruses. If result is PRESUMPTIVE POSTIVE SARS-CoV-2 nucleic acids MAY BE PRESENT.   A presumptive positive result was obtained on the submitted  specimen  and confirmed on repeat testing.  While 2019 novel coronavirus  (SARS-CoV-2) nucleic acids may be present in the submitted sample  additional confirmatory testing may be necessary for epidemiological  and / or clinical management purposes  to differentiate between  SARS-CoV-2 and other Sarbecovirus currently known to infect humans.  If clinically indicated additional testing with an alternate test  methodology [phone]) is advised. The SARS-CoV-2 RNA is generally  detectable in upper and lower respiratory sp ecimens during the acute  phase of infection. The expected result is Negative. Fact Sheet for Patients:  BoilerBrush.com.cy Fact Sheet for Healthcare Providers: https://pope.com/ This test is not yet approved or cleared by the [place] FDA and has been authorized for detection and/or diagnosis of SARS-CoV-2 by FDA under an Emergency Use Authorization (EUA).  This EUA will remain in  effect (meaning this test can be used) for the duration of the COVID-19 declaration under Section 564(b)(1) of the Act, 21 U.S.C. section 360bbb-3(b)(1), unless the authorization is terminated or revoked sooner. Performed at [facility], [address], [place], [place] [address]          Radiology Studies: Ct Head Wo Contrast  Addendum Date: 10/24/2018   ADDENDUM REPORT: 10/24/2018 16:33 ADDENDUM: Study discussed by telephone with PA-C JAIME WARD on 10/24/2018 at 16:32 . Electronically Signed   By: Odessa Fleming M.D.   On: 10/24/2018 16:33   Result Date: 10/24/2018 CLINICAL DATA:  78 year old female with unwitnessed fall. Found in parking lot. Confused. Blunt trauma. EXAM: CT HEAD WITHOUT CONTRAST CT MAXILLOFACIAL WITHOUT CONTRAST CT CERVICAL SPINE WITHOUT CONTRAST TECHNIQUE: Multidetector CT imaging of the head, cervical spine, and maxillofacial structures were performed using the standard protocol without intravenous contrast. Multiplanar CT image reconstructions of the cervical spine and maxillofacial structures were also generated. COMPARISON:  None. FINDINGS: CT HEAD FINDINGS Brain: Small volume subarachnoid hemorrhage over the superolateral hemispheres left greater than right (series 5, image 23). No basilar cistern or intraventricular blood. No subdural or other extra-axial blood identified. No intracranial mass effect. No ventriculomegaly. Patchy and confluent bilateral cerebral white matter hypodensity, but abnormal gray matter hypodensity in the right inferior temporal gyrus (series 5, image 12 and coronal image 39. No significant mass effect. The mesial right temporal lobe seems to remain normal. No intra-axial blood identified. Dystrophic basal ganglia calcifications. Vascular: Calcified atherosclerosis at the skull base. No suspicious intracranial vascular hyperdensity. Skull: No calvarium fracture identified. Other: Superficial left periorbital hematoma described below. Other scalp  soft tissues are within normal limits. CT MAXILLOFACIAL FINDINGS Osseous: Largely absent dentition. Mandible intact. No maxilla, zygoma, or nasal bone fracture identified. Central skull base appears intact. Orbits: Intact orbital walls. Left superior periorbital superficial scalp hematoma measuring up to 10 millimeters in thickness. Underlying left frontal bone intact. Globes and intraorbital soft tissues appear normal. Sinuses: Well pneumatized. Tympanic cavities and mastoids are clear. Negative nasal cavity aside from leftward septal deviation and trace retained secretions on the right. Soft tissues: Calcified carotid atherosclerosis in the neck. Partially retropharyngeal course of the right carotid. Superimposed postinflammatory calcifications of the palatine tonsils. Negative parapharyngeal spaces, retropharyngeal space, sublingual space, submandibular, parotid, and masticator spaces. No other superficial soft tissue injury identified. No upper cervical lymphadenopathy. CT CERVICAL SPINE FINDINGS Alignment: Degenerative appearing mild retrolisthesis of C5 on C6 with ankylosis at C4-C5 and also C2-C3. Straightening of lordosis otherwise. Posterior element alignment remains normal. Skull base and vertebrae: Visualized skull base is intact. No atlanto-occipital dissociation. Osteopenia. No acute osseous  abnormality identified. Soft tissues and spinal canal: No prevertebral fluid or swelling. No visible canal hematoma. Calcified carotid atherosclerosis in the neck as stated above. Disc levels: Advanced C1-C2 degeneration including partially calcified ligamentous hypertrophy. Ankylosis of C2-C3 posterior elements on the left and C4-C5 vertebral bodies and right posterior elements. Advanced disc and endplate degeneration at C5-C6 with superimposed mild retrolisthesis. Mild to moderate spinal stenosis at that level. Upper chest: Grossly intact visible upper thoracic levels. Negative visible lung apices. IMPRESSION: 1.  Nonspecific abnormal hypodensity in the right inferior temporal gyrus. Differential considerations include infarct, infection, encephalomalacia, and less likely tumor related edema. Recommend follow-up MRI brain without and with contrast to further characterize. 2. Superimposed Small volume posttraumatic subarachnoid hemorrhage over the convexities. No intracranial mass effect or ventriculomegaly. 3. No other acute traumatic injury to the brain identified. No fracture of the skull, face, or cervical spine identified. 4. Left periorbital superficial scalp hematoma. 5. 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 Electronically Signed: By: Odessa Fleming M.D. On: 10/24/2018 16:28   Ct Cervical Spine Wo Contrast  Addendum Date: 10/24/2018   ADDENDUM REPORT: 10/24/2018 16:33 ADDENDUM: Study discussed by telephone with PA-C JAIME WARD on 10/24/2018 at 16:32 . Electronically Signed   By: Odessa Fleming M.D.   On: 10/24/2018 16:33   Result Date: 10/24/2018 CLINICAL DATA:  78 year old female with unwitnessed fall. Found in parking lot. Confused. Blunt trauma. EXAM: CT HEAD WITHOUT CONTRAST CT MAXILLOFACIAL WITHOUT CONTRAST CT CERVICAL SPINE WITHOUT CONTRAST TECHNIQUE: Multidetector CT imaging of the head, cervical spine, and maxillofacial structures were performed using the standard protocol without intravenous contrast. Multiplanar CT image reconstructions of the cervical spine and maxillofacial structures were also generated. COMPARISON:  None. FINDINGS: CT HEAD FINDINGS Brain: Small volume subarachnoid hemorrhage over the superolateral hemispheres left greater than right (series 5, image 23). No basilar cistern or intraventricular blood. No subdural or other extra-axial blood identified. No intracranial mass effect. No ventriculomegaly. Patchy and confluent bilateral cerebral white matter hypodensity, but abnormal gray matter hypodensity in the right inferior temporal  gyrus (series 5, image 12 and coronal image 39. No significant mass effect. The mesial right temporal lobe seems to remain normal. No intra-axial blood identified. Dystrophic basal ganglia calcifications. Vascular: Calcified atherosclerosis at the skull base. No suspicious intracranial vascular hyperdensity. Skull: No calvarium fracture identified. Other: Superficial left periorbital hematoma described below. Other scalp soft tissues are within normal limits. CT MAXILLOFACIAL FINDINGS Osseous: Largely absent dentition. Mandible intact. No maxilla, zygoma, or nasal bone fracture identified. Central skull base appears intact. Orbits: Intact orbital walls. Left superior periorbital superficial scalp hematoma measuring up to 10 millimeters in thickness. Underlying left frontal bone intact. Globes and intraorbital soft tissues appear normal. Sinuses: Well pneumatized. Tympanic cavities and mastoids are clear. Negative nasal cavity aside from leftward septal deviation and trace retained secretions on the right. Soft tissues: Calcified carotid atherosclerosis in the neck. Partially retropharyngeal course of the right carotid. Superimposed postinflammatory calcifications of the palatine tonsils. Negative parapharyngeal spaces, retropharyngeal space, sublingual space, submandibular, parotid, and masticator spaces. No other superficial soft tissue injury identified. No upper cervical lymphadenopathy. CT CERVICAL SPINE FINDINGS Alignment: Degenerative appearing mild retrolisthesis of C5 on C6 with ankylosis at C4-C5 and also C2-C3. Straightening of lordosis otherwise. Posterior element alignment remains normal. Skull base and vertebrae: Visualized skull base is intact. No atlanto-occipital dissociation. Osteopenia. No acute osseous abnormality identified. Soft tissues and spinal canal: No prevertebral fluid or swelling. No  visible canal hematoma. Calcified carotid atherosclerosis in the neck as stated above. Disc levels:  Advanced C1-C2 degeneration including partially calcified ligamentous hypertrophy. Ankylosis of C2-C3 posterior elements on the left and C4-C5 vertebral bodies and right posterior elements. Advanced disc and endplate degeneration at C5-C6 with superimposed mild retrolisthesis. Mild to moderate spinal stenosis at that level. Upper chest: Grossly intact visible upper thoracic levels. Negative visible lung apices. IMPRESSION: 1. Nonspecific abnormal hypodensity in the right inferior temporal gyrus. Differential considerations include infarct, infection, encephalomalacia, and less likely tumor related edema. Recommend follow-up MRI brain without and with contrast to further characterize. 2. Superimposed Small volume posttraumatic subarachnoid hemorrhage over the convexities. No intracranial mass effect or ventriculomegaly. 3. No other acute traumatic injury to the brain identified. No fracture of the skull, face, or cervical spine identified. 4. Left periorbital superficial scalp hematoma. 5. 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 Electronically Signed: By: Odessa Fleming M.D. On: 10/24/2018 16:28   Mr Brain W And Wo Contrast  Result Date: 10/25/2018 CLINICAL DATA:  Fall with confusion.  Abnormal head CT. EXAM: MRI HEAD WITHOUT AND WITH CONTRAST TECHNIQUE: Multiplanar, multiecho pulse sequences of the brain and surrounding structures were obtained without and with intravenous contrast. CONTRAST:  5 mL Gadavist COMPARISON:  Head CT 10/24/2018 FINDINGS: BRAIN: Trace subarachnoid hemorrhage over both anterior convexities. There is advanced diffuse volume loss with confluent hyperintense T2-weighted signal within the periventricular and deep white matter. Large area of subcortical hyperintense T2-weighted signal within the right temporal lobe with ex vacuo dilatation of the right lateral ventricle atrium and occipital horn, likely indicating encephalomalacia at  the site of a remote infarct. Postcontrast images are severely motion degraded. There is mild leptomeningeal contrast enhancement at the right temporal lobe. VASCULAR: The major intracranial arterial and venous sinus flow voids are normal. SKULL AND UPPER CERVICAL SPINE: Calvarial bone marrow signal is normal. There is no skull base mass. Visualized upper cervical spine and soft tissues are normal. SINUSES/ORBITS: No fluid levels or advanced mucosal thickening. No mastoid or middle ear effusion. The orbits are normal. IMPRESSION: 1. No acute ischemia. 2. Unchanged trace subarachnoid hemorrhage over both convexities in a pattern consistent with trauma. 3. Large area of subcortical hyperintense T2-weighted signal within the right temporal lobe with associated leptomeningeal contrast enhancement. 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 4. Advanced atrophy and severe chronic ischemic white matter changes. 5. Postcontrast imaging severely motion degraded. Electronically Signed   By: Deatra Robinson M.D.   On: 10/25/2018 00:03   Ct Maxillofacial Wo Contrast  Addendum Date: 10/24/2018   ADDENDUM REPORT: 10/24/2018 16:33 ADDENDUM: Study discussed by telephone with PA-C JAIME WARD on 10/24/2018 at 16:32 . Electronically Signed   By: Odessa Fleming M.D.   On: 10/24/2018 16:33   Result Date: 10/24/2018 CLINICAL DATA:  78 year old female with unwitnessed fall. Found in parking lot. Confused. Blunt trauma. EXAM: CT HEAD WITHOUT CONTRAST CT MAXILLOFACIAL WITHOUT CONTRAST CT CERVICAL SPINE WITHOUT CONTRAST TECHNIQUE: Multidetector CT imaging of the head, cervical spine, and maxillofacial structures were performed using the standard protocol without intravenous contrast. Multiplanar CT image reconstructions of the cervical spine and maxillofacial structures were also generated. COMPARISON:  None. FINDINGS: CT HEAD FINDINGS Brain: Small volume subarachnoid hemorrhage over the superolateral hemispheres left greater  than right (series 5, image 23). No basilar cistern or intraventricular blood. No subdural or other extra-axial blood identified. No intracranial mass effect. No ventriculomegaly. Patchy and confluent  bilateral cerebral white matter hypodensity, but abnormal gray matter hypodensity in the right inferior temporal gyrus (series 5, image 12 and coronal image 39. No significant mass effect. The mesial right temporal lobe seems to remain normal. No intra-axial blood identified. Dystrophic basal ganglia calcifications. Vascular: Calcified atherosclerosis at the skull base. No suspicious intracranial vascular hyperdensity. Skull: No calvarium fracture identified. Other: Superficial left periorbital hematoma described below. Other scalp soft tissues are within normal limits. CT MAXILLOFACIAL FINDINGS Osseous: Largely absent dentition. Mandible intact. No maxilla, zygoma, or nasal bone fracture identified. Central skull base appears intact. Orbits: Intact orbital walls. Left superior periorbital superficial scalp hematoma measuring up to 10 millimeters in thickness. Underlying left frontal bone intact. Globes and intraorbital soft tissues appear normal. Sinuses: Well pneumatized. Tympanic cavities and mastoids are clear. Negative nasal cavity aside from leftward septal deviation and trace retained secretions on the right. Soft tissues: Calcified carotid atherosclerosis in the neck. Partially retropharyngeal course of the right carotid. Superimposed postinflammatory calcifications of the palatine tonsils. Negative parapharyngeal spaces, retropharyngeal space, sublingual space, submandibular, parotid, and masticator spaces. No other superficial soft tissue injury identified. No upper cervical lymphadenopathy. CT CERVICAL SPINE FINDINGS Alignment: Degenerative appearing mild retrolisthesis of C5 on C6 with ankylosis at C4-C5 and also C2-C3. Straightening of lordosis otherwise. Posterior element alignment remains normal. Skull  base and vertebrae: Visualized skull base is intact. No atlanto-occipital dissociation. Osteopenia. No acute osseous abnormality identified. Soft tissues and spinal canal: No prevertebral fluid or swelling. No visible canal hematoma. Calcified carotid atherosclerosis in the neck as stated above. Disc levels: Advanced C1-C2 degeneration including partially calcified ligamentous hypertrophy. Ankylosis of C2-C3 posterior elements on the left and C4-C5 vertebral bodies and right posterior elements. Advanced disc and endplate degeneration at C5-C6 with superimposed mild retrolisthesis. Mild to moderate spinal stenosis at that level. Upper chest: Grossly intact visible upper thoracic levels. Negative visible lung apices. IMPRESSION: 1. Nonspecific abnormal hypodensity in the right inferior temporal gyrus. Differential considerations include infarct, infection, encephalomalacia, and less likely tumor related edema. Recommend follow-up MRI brain without and with contrast to further characterize. 2. Superimposed Small volume posttraumatic subarachnoid hemorrhage over the convexities. No intracranial mass effect or ventriculomegaly. 3. No other acute traumatic injury to the brain identified. No fracture of the skull, face, or cervical spine identified. 4. Left periorbital superficial scalp hematoma. 5. 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 Electronically Signed: By: Odessa Fleming M.D. On: 10/24/2018 16:28        Scheduled Meds:  amLODipine  5 mg Oral Daily   atenolol  100 mg Oral Daily   lisinopril  20 mg Oral Daily   And   hydrochlorothiazide  12.5 mg Oral Daily   multivitamin  1 tablet Oral Daily   pantoprazole  40 mg Oral Daily   simvastatin  20 mg Oral QPM   Continuous Infusions:  sodium chloride 75 mL/hr at 10/24/18 2029   potassium chloride 10 mEq (10/25/18 0651)     LOS: 1 day   TTime spent: 40 minutes     Ciarah Peace, Roselind Messier,  MD [facility] Pager [phone]  If 7PM-7AM, please contact night-coverage [email or web address] Password [email or web address] 10/25/2018, 9:06 AM

## 2018-10-25 NOTE — Consult Note (Addendum)
Neurology Consultation  Reason for Consult:stroke Referring Physician: Joseph Art  CC: stroke  History is obtained from:Chart  HPI: Tammy Navarro is a 78 y.o. female HTN and now found to have a subacute large area of subcortical hyperintense T2-weighted signal within the right temporal lobe with associated leptomeningeal contrast enhancement. 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  Also trace of subarachnoid hemorrhage over both convexities in a pattern consistent with trauma.  Majority the history if not all was obtained from the chart.  Per chart patient was found in the [employer] parking lot after sustaining a fall but this was unwitnessed.  A bystander called 911 and the patient was brought to the ER.  Patient was found to have blood all over the left side of her head and scalp.  She was awake but had no recollection of the event.  Apparently she lives alone and is self-sufficient.  She had gone to [employer] to get groceries to have for Mother's Day meal.  Currently patient is confused, able to follow visual commands with some assistance.  When asked multiple questions to test her mental status she either quickly says "I do not know "and or perseverates on previous answer.  She has been acting funny for approximately a little over a month per her son.  LKW: Unknown tpa given?: no, out of window Premorbid modified Rankin scale (mRS): 0 NIH stroke scale of 6 ICH Score: 0   ZOX:[id number] to obtain due to altered mental status.  Past Medical History:  Diagnosis Date  . Hypertension     Family History  Problem Relation Age of Onset  . Hypertension Mother   . Hypertension Father    Social History:   reports that she has never smoked. She does not have any smokeless tobacco history on file. She reports that she does not drink alcohol or use drugs.  Medications  Current Facility-Administered Medications:  .  0.9 %  sodium chloride infusion, , Intravenous,  Continuous, Garba, Mohammad L, MD, Last Rate: 75 mL/hr at 10/24/18 2029 .  amLODipine (NORVASC) tablet 5 mg, 5 mg, Oral, Daily, Mikeal Hawthorne, Mohammad L, MD, 5 mg at 10/25/18 1013 .  atenolol (TENORMIN) tablet 100 mg, 100 mg, Oral, Daily, Mikeal Hawthorne, Mohammad L, MD, 100 mg at 10/25/18 1013 .  lisinopril (ZESTRIL) tablet 20 mg, 20 mg, Oral, Daily, 20 mg at 10/25/18 1014 **AND** hydrochlorothiazide (MICROZIDE) capsule 12.5 mg, 12.5 mg, Oral, Daily, Garba, Mohammad L, MD, 12.5 mg at 10/25/18 1014 .  loperamide (IMODIUM) capsule 2 mg, 2 mg, Oral, QID PRN, Earlie Lou L, MD .  multivitamin (PROSIGHT) tablet 1 tablet, 1 tablet, Oral, Daily, Rometta Emery, MD, 1 tablet at 10/25/18 1014 .  ondansetron (ZOFRAN) tablet 4 mg, 4 mg, Oral, Q6H PRN **OR** ondansetron (ZOFRAN) injection 4 mg, 4 mg, Intravenous, Q6H PRN, Garba, Mohammad L, MD .  pantoprazole (PROTONIX) EC tablet 40 mg, 40 mg, Oral, Daily, Mikeal Hawthorne, Mohammad L, MD, 40 mg at 10/25/18 1014 .  simvastatin (ZOCOR) tablet 20 mg, 20 mg, Oral, QPM, Rometta Emery, MD   Exam: Current vital signs: BP 111/68 (BP Location: Left Arm)   Pulse (!) 55   Temp 98.2 F (36.8 C) (Axillary)   Resp 16   Ht 6' (1.829 m)   Wt 59 kg   SpO2 98%   BMI 17.63 kg/m   Vital signs in last 24 hours: Temp:  [97.6 F (36.4 C)-98.2 F (36.8 C)] 98.2 F (36.8 C) (05/11 1153)  Pulse Rate:  [42-105] 55 (05/11 1153) Resp:  [10-26] 16 (05/11 1153) BP: (111-174)/(68-98) 111/68 (05/11 1153) SpO2:  [87 %-100 %] 98 % (05/11 1153)  Physical Exam  Constitutional: Appears well-developed and well-nourished.  Psych: Confused Eyes: No scleral injection HENT: No OP obstrucion Head: Normocephalic.  Multiple hematomas throughout the face Cardiovascular: Normal rate and regular rhythm.  Respiratory: Effort normal, non-labored breathing GI: Soft.  No distension. There is no tenderness.  Skin: WDI with multiple hematomas  Neuro: Mental Status: Patient is awake, she is awake, gives  month as April, follows commands. Answers are breif.  Patient is not able to give a clear and coherent history.  Cranial Nerves: II: Visual Field with left upper field cut.  III,IV, VI: EOMI with ptosis left eye secondary to inflammation and edema. Pupils equal, round and reactive to light V: Facial sensation is symmetric to temperature VII: Facial movement is symmetric.  VIII: hearing is intact to voice X: Palat elevates symmetrically XI: Shoulder shrug is symmetric. XII: tongue is midline without atrophy or fasciculations.  Motor: Patient is able to hold bilateral arms antigravity for 10 seconds and shows me 3/5 strength.  Her lower extremities she is able to lift off the bed but allows them to fall to the bed fairly quickly but withdraws quickly to noxious stimuli Sensory: Sensation is symmetric to light touch and temperature in the arms and legs.   Deep Tendon Reflexes: 2+ and symmetric in the biceps and patellae.  Plantars: Toes are downgoing bilaterally.  Cerebellar: FNF  intact bilaterally  Labs I have reviewed labs in epic and the results pertinent to this consultation are:   CBC    Component Value Date/Time   WBC 7.7 10/25/2018 0331   RBC 4.10 10/25/2018 0331   HGB 13.0 10/25/2018 0331   HCT 35.8 (L) 10/25/2018 0331   PLT 232 10/25/2018 0331   MCV 87.3 10/25/2018 0331   MCV 92.2 11/06/2012 1716   MCH 31.7 10/25/2018 0331   MCHC 36.3 (H) 10/25/2018 0331   RDW 12.4 10/25/2018 0331   LYMPHSABS 1.0 10/24/2018 1545   MONOABS 0.7 10/24/2018 1545   EOSABS 0.1 10/24/2018 1545   BASOSABS 0.0 10/24/2018 1545    CMP     Component Value Date/Time   NA 130 (L) 10/25/2018 0331   K 2.7 (LL) 10/25/2018 0331   CL 92 (L) 10/25/2018 0331   CO2 25 10/25/2018 0331   GLUCOSE 80 10/25/2018 0331   BUN 11 10/25/2018 0331   CREATININE 0.62 10/25/2018 0331   CALCIUM 8.8 (L) 10/25/2018 0331   PROT 6.0 (L) 10/25/2018 0331   ALBUMIN 3.1 (L) 10/25/2018 0331   AST 26 10/25/2018  0331   ALT 14 10/25/2018 0331   ALKPHOS 57 10/25/2018 0331   BILITOT 1.3 (H) 10/25/2018 0331   GFRNONAA >60 10/25/2018 0331   GFRAA >60 10/25/2018 0331    Lipid Panel  No results found for: CHOL, TRIG, HDL, CHOLHDL, VLDL, LDLCALC, LDLDIRECT   Imaging I have reviewed the images obtained:  CT-scan of the brain- nonspecific abnormal hypodensity in the right inferior temporal gyrus.  Differential considerations include infarct, infection, encephalomalacia, and less likely tumor with edema.  Superimposed small volume posttraumatic subarachnoid hemorrhage over the convexities.  No intracranial mass-effect or ventriculomegaly.  MRI examination of the brain- no acute ischemia.  Unchanged trace subarachnoid hemorrhage over both convexities in a pattern consistent with trauma.  Large area of subcortical hyperintense T2 weighted signal within the right temporal lobe with associated  leptomeningeal contrast enhancement.  Likely indicates a late subacute or early chronic infarct.  Echocardiogram bubble study- left ventricular has normal systolic function with ejection fraction of 60-65%.  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  She also has head injury, which given pauses noted on telemetry as well as new onset atrial fibrillation which could result in a rapid rate, I suspect cardiac syncope.  I would favor getting an EEG given the predilection of the temporal lobe to be a seizure focus, but certainly cardiac syncope would be a likely culprit.  Given that it is no longer bright on DWI, I do think that repeat imaging in 1 to 2 months would be prudent, but given the clinical scenario I think that subacute stroke is very likely and clinically, this is a  location that can sometimes be relatively hard to pick up.  The timeframe of 1 to 2 months of "acting funny" I think fits relatively well with this imaging.   1) EEG 2) agree with holding anticoagulation for the time being, though she will likely needed over the long-term. 3) repeat imaging in 1 to 2 months 4) PT, OT, ST 5) stroke team to follow  Ritta Slot, MD [facility] [phone]  If 7pm- 7am, please page neurology on call as listed in AMION.

## 2018-10-25 NOTE — Progress Notes (Signed)
EEG Complete  Results Pending 

## 2018-10-25 NOTE — Procedures (Signed)
History: 78 year old female with recent traumatic brain injury and possible syncope  Sedation: None  Technique: This is a 21 channel routine scalp EEG performed at the bedside with bipolar and monopolar montages arranged in accordance to the international 10/20 system of electrode placement. One channel was dedicated to EKG recording.    Background:  There is a posterior dominant rhythm of 9 hz which is relatively symmetric.  There is generalized bifrontally predominant irregular delta and theta range activity intruding into the background throughout the study.  No epileptiform discharges were seen.  Photic stimulation: Physiologic driving is present  EEG Abnormalities: 1) generalized irregular slow activity  Clinical Interpretation: This EEG is consistent with a generalized non-specific cerebral dysfunction(encephalopathy). There was no seizure or seizure predisposition recorded on this study.   Ritta Slot, MD [facility] [phone]  If 7pm- 7am, please page neurology on call as listed in AMION.

## 2018-10-26 ENCOUNTER — Inpatient Hospital Stay (HOSPITAL_COMMUNITY): Payer: Medicare Other

## 2018-10-26 DIAGNOSIS — E871 Hypo-osmolality and hyponatremia: Secondary | ICD-10-CM

## 2018-10-26 LAB — BASIC METABOLIC PANEL
Anion gap: 18 — ABNORMAL HIGH (ref 5–15)
BUN: 9 mg/dL (ref 8–23)
CO2: 22 mmol/L (ref 22–32)
Calcium: 9 mg/dL (ref 8.9–10.3)
Chloride: 90 mmol/L — ABNORMAL LOW (ref 98–111)
Creatinine, Ser: 0.65 mg/dL (ref 0.44–1.00)
GFR calc Af Amer: >60 mL/min (ref >60)
GFR calc non Af Amer: >60 mL/min (ref >60)
Glucose, Bld: 78 mg/dL (ref 70–99)
Potassium: 2.9 mmol/L — ABNORMAL LOW (ref 3.5–5.1)
Sodium: 130 mmol/L — ABNORMAL LOW (ref 135–145)

## 2018-10-26 LAB — TSH: TSH: 2.222 u[IU]/mL (ref 0.350–4.500)

## 2018-10-26 LAB — LIPID PANEL
Cholesterol: 135 mg/dL (ref 0–200)
HDL: 55 mg/dL (ref >40)
LDL Cholesterol: 72 mg/dL (ref 0–99)
Total CHOL/HDL Ratio: 2.5 RATIO
Triglycerides: 39 mg/dL (ref <150)
VLDL: 8 mg/dL (ref 0–40)

## 2018-10-26 LAB — CBC
HCT: 37.4 % (ref 36.0–46.0)
Hemoglobin: 13.2 g/dL (ref 12.0–15.0)
MCH: 31 pg (ref 26.0–34.0)
MCHC: 35.3 g/dL (ref 30.0–36.0)
MCV: 87.8 fL (ref 80.0–100.0)
Platelets: 239 10*3/uL (ref 150–400)
RBC: 4.26 MIL/uL (ref 3.87–5.11)
RDW: 12.5 % (ref 11.5–15.5)
WBC: 5.8 10*3/uL (ref 4.0–10.5)
nRBC: 0 % (ref 0.0–0.2)

## 2018-10-26 LAB — VITAMIN B12: Vitamin B-12: 514 pg/mL (ref 180–914)

## 2018-10-26 LAB — MAGNESIUM: Magnesium: 1.6 mg/dL — ABNORMAL LOW (ref 1.7–2.4)

## 2018-10-26 MED ORDER — POTASSIUM CHLORIDE 10 MEQ/100ML IV SOLN
10.0000 meq | INTRAVENOUS | Status: AC
  Administered 2018-10-26 (×2): 10 meq via INTRAVENOUS
  Filled 2018-10-26 (×2): qty 100

## 2018-10-26 MED ORDER — POTASSIUM CHLORIDE CRYS ER 20 MEQ PO TBCR
40.0000 meq | EXTENDED_RELEASE_TABLET | Freq: Two times a day (BID) | ORAL | Status: AC
  Administered 2018-10-26 (×2): 40 meq via ORAL
  Filled 2018-10-26 (×2): qty 2

## 2018-10-26 MED ORDER — IOHEXOL 350 MG/ML SOLN
75.0000 mL | Freq: Once | INTRAVENOUS | Status: AC
  Administered 2018-10-26: 09:00:00 75 mL via INTRAVENOUS

## 2018-10-26 MED ORDER — MAGNESIUM SULFATE 50 % IJ SOLN
3.0000 g | Freq: Once | INTRAVENOUS | Status: AC
  Administered 2018-10-26: 3 g via INTRAVENOUS
  Filled 2018-10-26: qty 6

## 2018-10-26 NOTE — Progress Notes (Signed)
STROKE TEAM PROGRESS NOTE   INTERVAL HISTORY I have personally reviewed history of presenting illness with the patient in details.  She appears confused and disoriented and is unable to provide clear history.  I called the patient's son's listed home number and mobile number but was unable to get thru or leave a message.  She was found falling down with injury to her face and forehead return small rheumatic subarachnoid hemorrhage.  CT scan and MRI showed right temporal lesion raising concern for subacute chronic infarct  Vitals:   10/26/18 0022 10/26/18 0317 10/26/18 0741 10/26/18 1151  BP: (!) 147/76 133/78 139/83 (!) 176/94  Pulse: 68 72 67 71  Resp: 16 13 14 16   Temp:  97.7 F (36.5 C) 97.8 F (36.6 C) 98.3 F (36.8 C)  TempSrc:  Oral Oral   SpO2: 99% 98% 98% 97%  Weight:      Height:        CBC:  Recent Labs  Lab 10/24/18 1545 10/25/18 0331 10/26/18 0440  WBC 6.7 7.7 5.8  NEUTROABS 5.0  --   --   HGB 14.2 13.0 13.2  HCT 39.3 35.8* 37.4  MCV 86.4 87.3 87.8  PLT 264 232 239    Basic Metabolic Panel:  Recent Labs  Lab 10/25/18 0331 10/25/18 1525 10/26/18 0440  NA 130*  --  130*  K 2.7* 3.2* 2.9*  CL 92*  --  90*  CO2 25  --  22  GLUCOSE 80  --  78  BUN 11  --  9  CREATININE 0.62  --  0.65  CALCIUM 8.8*  --  9.0  MG  --  1.7 1.6*   Lipid Panel:     Component Value Date/Time   CHOL 135 10/26/2018 0440   TRIG 39 10/26/2018 0440   HDL 55 10/26/2018 0440   CHOLHDL 2.5 10/26/2018 0440   VLDL 8 10/26/2018 0440   LDLCALC 72 10/26/2018 0440   HgbA1c:  Lab Results  Component Value Date   HGBA1C 5.0 10/25/2018    PHYSICAL EXAM Frail elderly Caucasian lady not in distress. Left frontal abrasion and left cheek bruising. . Afebrile. Head is nontraumatic. Neck is supple without bruit.    Cardiac exam no murmur or gallop. Lungs are clear to auscultation. Distal pulses are well felt. Neurological Exam :  Patient is awake alert disoriented x3.  She has diminished  attention, registration and recall.  She is unable to tell me where she is a month, year or name president.  Follows simple midline and one-step commands.  Blinks to threat bilaterally.  Extraocular movements are full range without nystagmus.  She has decreased hearing bilaterally.  Her speech is clear without dysarthria or aphasia.  She is able to move all 4 extremities against gravity without focal weakness but is unable to cooperate for detailed muscle testing.  ASSESSMENT/PLAN Ms. ALYSSAH WARE is a 78 y.o. female with history of HTN found in the [employer] parking lot after sustaining an unwitnessed fall.  Found to have traumatic bilateral convexity SAH.  Confused on presentation.  Imaging shows questionable right temporal hypodensity-etiology indeterminate  ?  Remote trauma, injury or stroke  Traumatic SAH following fall ? Etiology syncope from AFIB Hypodensity right temporal lobe of unclear etiology, unlikely to be acute stroke or seizure  CT head, Maxillofacial  nonspecific hypodensity right inferior temporal gyrus.  Small posttraumatic SAH over convexities.  Left periorbital superficial scalp hematoma.  CT spine with degeneration and ankylosis with  mild to moderate degenerative spinal stenosis C5-6  MRI no acute stroke.  Trace subarachnoid hemorrhage over both convexities consistent with trauma.  Large area of subcortical hyperintense T2 signal in right temporal lobe with associated leptomeningeal contrast enhancement (?  Late subacute or early chronic infarct).  Advanced atrophy.  Severe small vessel disease.  Post contrast imaging severely motion degraded.  CTA head & neck no significant stenosis intra-or extracranial.  Resolving intracranial SAH.  Increasing prominence of delayed intracerebral hematoma left frontal cortex with mild surrounding edema.  Subacute right temporal infarct.  2D Echo w/ bubble EF 60 to 65%.  Patient in A. fib.  Bubble study negative.  Repeat CT head in am  pending   EEG negative   LDL 72   HgbA1c 5.0  TSH & VB 12 normal  SCDs for VTE prophylaxis  No antithrombotic prior to admission, now on No antithrombotic given traumatic SAH. if hemorrhage resolved on CT in a.m., ok [facility]  Therapy recommendations: SNF  Disposition: Pending  Atrial Fibrillation, new  Home anticoagulation:  none   CHA2DS2-VASc Score = 4, ?2 oral anticoagulation recommended  Age in Years:  ?[age]   +2    Sex:  Female   Female   +1    Hypertension History:  yes   +1     Diabetes Mellitus:  0  Congestive Heart Failure History:  0  Vascular Disease History:  0     Stroke/TIA/Thromboembolism History:  0 .  if hemorrhage resolved on CT in a.m., ok [facility]  Hypertension  Stable . Recommend SBP goal < 180 . Long-term BP goal normotensive  Hyperlipidemia  Home meds: Zocor 20, resumed in hospital  LDL 72, goal < 70  Continue statin at discharge  Other Stroke Risk Factors  Advanced age  Other Active Problems  Hyponatremia  Hypokalemia  Hospital day # 2  I have personally obtained history,examined this patient, reviewed notes, independently viewed imaging studies, participated in medical decision making and plan of care.ROS completed by me personally and pertinent positives fully documented  I have made any additions or clarifications directly to the above note.   Patient has an unclear history but she clearly had a fall has traumatic small bifrontal subarachnoid hemorrhage and confusion as a result.  Etiology of the fall is not clear whether she had syncope due to A. fib.  Abnormal CT scan and MRI showed right temporal lesion of unknown clear etiology doubt stroke exam is nonfocal except for confusion.  Recommend repeat CT scan of the head tomorrow subarachnoid blood is resolved aspirin for stroke prevention for her A. fib rather than anticoagulation as she is high risk for falls suspect may have cognitive impairment at baseline.  I tried  to reach the patient's son but was unsuccessful.. Discussed with Dr. Blake DivineAkula.I have spent a total of   35 minutes with the patient reviewing hospital notes,  test results, labs and examining the patient as well as establishing an assessment and plan that was discussed personally with the patient.  > 50% of time was spent in direct patient care.      Delia HeadyPramod Shamra Bradeen, MD Medical Director [facility] Pager: [phone] 10/26/2018 4:03 PM   To contact Stroke Continuity provider, please refer to [email or web address]. After hours, contact General Neurology

## 2018-10-26 NOTE — Progress Notes (Addendum)
PROGRESS NOTE    Tammy Navarro  HDQ:[id number] DOB: [birth year] DOA: 10/24/2018 PCP: Gaspar Garbe, MD    Brief Narrative:  78 y.o.WFPMHx HTN  was found down at [employer] parking lot after sustaining a fall that was not witnessed. A bystander called 911 and patient was brought to the ER. She was found to have blood all over the left side of her head and scalp. Patient is awake now but has no recollection of what happened. She has no recollection of going to the [employer] She lives alone at home and according to her son she was supposed to have gone to get grocery for a Mother's Day meal that they were supposed to have later in the day. She usually does that alone. She takes care of herself without much assistance. Patient is found to have a small subarachnoid hemorrhage, new onset A. fib as well as findings on her CT suggestive of possible CVA. She is being admitted to the hospital for evaluation and treatment.   Assessment & Plan:   Principal Problem:   SAH (subarachnoid hemorrhage) (HCC) Active Problems:   Fall at home, initial encounter   HTN (hypertension)   New onset a-fib (HCC)   CVA (cerebral vascular accident) (HCC)   Hyponatremia   Hypokalemia   Large area of Right temporal lobe ischemia, possibly a sub acute infarct with Unchanged trace subarachnoid hemorrhage over both convexities in a pattern consistent with trauma Pt had another fall earlier this am. Plan for a repeat CT angio of the head and neck.  Neurology consulted and recommendations given.  EEG ordered does not show any seizures or seizure predisposition, but was found to have generalized non specific cerebral dysfunction consistent with encephalopathy , plan for repeat imaging in 1 to 2 months.  LDL is 72, A1c is 5  SARS Corona virus is negative.  Will need PT/OT/ SLP evaluation   Essential hypertension:  Hold Norvasc and hydrochlorothiazide, repeat orthostatic vital signs today.  Resume  lisinopril.    ? New onset atrial fibrillation:  Rate controlled. Currently not a candidate for anticoagulation.     Hypokalemia and hypomagnesemia:  Replace as needed.   Hyponatremia:  Possibly from dehydration and HCTZ use.  CHECK TSH, am cortisol, serum osmolality and urine sodium. Evaluate for SIADH.  Repeat BMP in am.    DVT prophylaxis: scd's Code Status: full code.  Family Communication: none at bedside. Discussed with son over the phone on 5/12. Disposition Plan: pending clinical improvement and further evaluation.    Consultants:   Neurology.    Procedures: MRI brain.   CT head without contrast.   Antimicrobials: None.   Subjective: No chest pain or sob.  Confused slightly.   Objective: Vitals:   10/25/18 2304 10/26/18 0022 10/26/18 0317 10/26/18 0741  BP: (!) 126/91 (!) 147/76 133/78 139/83  Pulse: 74 68 72 67  Resp: 15 16 13 14   Temp: (!) 97.4 F (36.3 C)  97.7 F (36.5 C) 97.8 F (36.6 C)  TempSrc: Oral  Oral Oral  SpO2: 98% 99% 98% 98%  Weight:      Height:       No intake or output data in the 24 hours ending 10/26/18 1033 Filed Weights   10/24/18 1509  Weight: 59 kg    Examination:  General exam: facial bruising., left periorbital ecchymosis.  Respiratory system: Clear to auscultation. Respiratory effort normal. Cardiovascular system: S1 & S2 heard, RRR.  No pedal edema. Gastrointestinal system:  Abdomen is nondistended, soft and nontender. No organomegaly or masses felt. Normal bowel sounds heard. Central nervous system: Alert , but confused. Able to move all extremities.  Extremities: Symmetric 5 x 5 power. Skin: No rashes, lesions or ulcers Psychiatry: Mood & affect appropriate.     Data Reviewed: I have personally reviewed following labs and imaging studies  CBC: Recent Labs  Lab 10/24/18 1545 10/25/18 0331 10/26/18 0440  WBC 6.7 7.7 5.8  NEUTROABS 5.0  --   --   HGB 14.2 13.0 13.2  HCT 39.3 35.8* 37.4  MCV 86.4  87.3 87.8  PLT 264 232 239   Basic Metabolic Panel: Recent Labs  Lab 10/24/18 1545 10/25/18 0331 10/25/18 1525 10/26/18 0440  NA 124* 130*  --  130*  K 2.8* 2.7* 3.2* 2.9*  CL 81* 92*  --  90*  CO2 25 25  --  22  GLUCOSE 97 80  --  78  BUN 15 11  --  9  CREATININE 0.64 0.62  --  0.65  CALCIUM 9.1 8.8*  --  9.0  MG  --   --  1.7 1.6*   GFR: Estimated Creatinine Clearance: 53.1 mL/min (by C-G formula based on SCr of 0.65 mg/dL). Liver Function Tests: Recent Labs  Lab 10/24/18 1545 10/25/18 0331  AST 29 26  ALT 16 14  ALKPHOS 70 57  BILITOT 1.1 1.3*  PROT 6.8 6.0*  ALBUMIN 3.7 3.1*   No results for input(s): LIPASE, AMYLASE in the last 168 hours. No results for input(s): AMMONIA in the last 168 hours. Coagulation Profile: No results for input(s): INR, PROTIME in the last 168 hours. Cardiac Enzymes: Recent Labs  Lab 10/24/18 1545  TROPONINI <0.03   BNP (last 3 results) No results for input(s): PROBNP in the last 8760 hours. HbA1C: Recent Labs    10/25/18 1540  HGBA1C 5.0   CBG: No results for input(s): GLUCAP in the last 168 hours. Lipid Profile: Recent Labs    10/26/18 0440  CHOL 135  HDL 55  LDLCALC 72  TRIG 39  CHOLHDL 2.5   Thyroid Function Tests: No results for input(s): TSH, T4TOTAL, FREET4, T3FREE, THYROIDAB in the last 72 hours. Anemia Panel: No results for input(s): VITAMINB12, FOLATE, FERRITIN, TIBC, IRON, RETICCTPCT in the last 72 hours. Sepsis Labs: No results for input(s): PROCALCITON, LATICACIDVEN in the last 168 hours.  Recent Results (from the past 240 hour(s))  SARS Coronavirus 2 (CEPHEID - Performed in [facility] hospital lab), Hosp Order     Status: None   Collection Time: 10/24/18  7:17 PM  Result Value Ref Range Status   SARS Coronavirus 2 NEGATIVE NEGATIVE Final    Comment: (NOTE) If result is NEGATIVE SARS-CoV-2 target nucleic acids are NOT DETECTED. The SARS-CoV-2 RNA is generally detectable in upper and lower   respiratory specimens during the acute phase of infection. The lowest  concentration of SARS-CoV-2 viral copies this assay can detect is 250  copies / mL. A negative result does not preclude SARS-CoV-2 infection  and should not be used as the sole basis for treatment or other  patient management decisions.  A negative result may occur with  improper specimen collection / handling, submission of specimen other  than nasopharyngeal swab, presence of viral mutation(s) within the  areas targeted by this assay, and inadequate number of viral copies  (<250 copies / mL). A negative result must be combined with clinical  observations, patient history, and epidemiological information. If result is  POSITIVE SARS-CoV-2 target nucleic acids are DETECTED. The SARS-CoV-2 RNA is generally detectable in upper and lower  respiratory specimens dur ing the acute phase of infection.  Positive  results are indicative of active infection with SARS-CoV-2.  Clinical  correlation with patient history and other diagnostic information is  necessary to determine patient infection status.  Positive results do  not rule out bacterial infection or co-infection with other viruses. If result is PRESUMPTIVE POSTIVE SARS-CoV-2 nucleic acids MAY BE PRESENT.   A presumptive positive result was obtained on the submitted specimen  and confirmed on repeat testing.  While 2019 novel coronavirus  (SARS-CoV-2) nucleic acids may be present in the submitted sample  additional confirmatory testing may be necessary for epidemiological  and / or clinical management purposes  to differentiate between  SARS-CoV-2 and other Sarbecovirus currently known to infect humans.  If clinically indicated additional testing with an alternate test  methodology [phone]([id number]) is advised. The SARS-CoV-2 RNA is generally  detectable in upper and lower respiratory sp ecimens during the acute  phase of infection. The expected result is Negative. Fact  Sheet for Patients:  BoilerBrush.com.cyhttps://www.fda.gov/media/136312/download Fact Sheet for Healthcare Providers: https://pope.com/https://www.fda.gov/media/136313/download This test is not yet approved or cleared by the [place] FDA and has been authorized for detection and/or diagnosis of SARS-CoV-2 by FDA under an Emergency Use Authorization (EUA).  This EUA will remain in effect (meaning this test can be used) for the duration of the COVID-19 declaration under Section 564(b)(1) of the Act, 21 U.S.C. section 360bbb-3(b)(1), unless the authorization is terminated or revoked sooner. Performed at [facility], [address] [place], [place] [address]          Radiology Studies: Ct Head Wo Contrast  Addendum Date: 10/24/2018   ADDENDUM REPORT: 10/24/2018 16:33 ADDENDUM: Study discussed by telephone with PA-C JAIME WARD on 10/24/2018 at 16:32 . Electronically Signed   By: Odessa FlemingH  Hall M.D.   On: 10/24/2018 16:33   Result Date: 10/24/2018 CLINICAL DATA:  78 year old female with unwitnessed fall. Found in parking lot. Confused. Blunt trauma. EXAM: CT HEAD WITHOUT CONTRAST CT MAXILLOFACIAL WITHOUT CONTRAST CT CERVICAL SPINE WITHOUT CONTRAST TECHNIQUE: Multidetector CT imaging of the head, cervical spine, and maxillofacial structures were performed using the standard protocol without intravenous contrast. Multiplanar CT image reconstructions of the cervical spine and maxillofacial structures were also generated. COMPARISON:  None. FINDINGS: CT HEAD FINDINGS Brain: Small volume subarachnoid hemorrhage over the superolateral hemispheres left greater than right (series 5, image 23). No basilar cistern or intraventricular blood. No subdural or other extra-axial blood identified. No intracranial mass effect. No ventriculomegaly. Patchy and confluent bilateral cerebral white matter hypodensity, but abnormal gray matter hypodensity in the right inferior temporal gyrus (series 5, image 12 and coronal image 39. No significant mass  effect. The mesial right temporal lobe seems to remain normal. No intra-axial blood identified. Dystrophic basal ganglia calcifications. Vascular: Calcified atherosclerosis at the skull base. No suspicious intracranial vascular hyperdensity. Skull: No calvarium fracture identified. Other: Superficial left periorbital hematoma described below. Other scalp soft tissues are within normal limits. CT MAXILLOFACIAL FINDINGS Osseous: Largely absent dentition. Mandible intact. No maxilla, zygoma, or nasal bone fracture identified. Central skull base appears intact. Orbits: Intact orbital walls. Left superior periorbital superficial scalp hematoma measuring up to 10 millimeters in thickness. Underlying left frontal bone intact. Globes and intraorbital soft tissues appear normal. Sinuses: Well pneumatized. Tympanic cavities and mastoids are clear. Negative nasal cavity aside from leftward septal deviation and trace retained  secretions on the right. Soft tissues: Calcified carotid atherosclerosis in the neck. Partially retropharyngeal course of the right carotid. Superimposed postinflammatory calcifications of the palatine tonsils. Negative parapharyngeal spaces, retropharyngeal space, sublingual space, submandibular, parotid, and masticator spaces. No other superficial soft tissue injury identified. No upper cervical lymphadenopathy. CT CERVICAL SPINE FINDINGS Alignment: Degenerative appearing mild retrolisthesis of C5 on C6 with ankylosis at C4-C5 and also C2-C3. Straightening of lordosis otherwise. Posterior element alignment remains normal. Skull base and vertebrae: Visualized skull base is intact. No atlanto-occipital dissociation. Osteopenia. No acute osseous abnormality identified. Soft tissues and spinal canal: No prevertebral fluid or swelling. No visible canal hematoma. Calcified carotid atherosclerosis in the neck as stated above. Disc levels: Advanced C1-C2 degeneration including partially calcified ligamentous  hypertrophy. Ankylosis of C2-C3 posterior elements on the left and C4-C5 vertebral bodies and right posterior elements. Advanced disc and endplate degeneration at C5-C6 with superimposed mild retrolisthesis. Mild to moderate spinal stenosis at that level. Upper chest: Grossly intact visible upper thoracic levels. Negative visible lung apices. IMPRESSION: 1. Nonspecific abnormal hypodensity in the right inferior temporal gyrus. Differential considerations include infarct, infection, encephalomalacia, and less likely tumor related edema. Recommend follow-up MRI brain without and with contrast to further characterize. 2. Superimposed Small volume posttraumatic subarachnoid hemorrhage over the convexities. No intracranial mass effect or ventriculomegaly. 3. No other acute traumatic injury to the brain identified. No fracture of the skull, face, or cervical spine identified. 4. Left periorbital superficial scalp hematoma. 5. 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 Electronically Signed: By: Odessa Fleming M.D. On: 10/24/2018 16:28   Ct Cervical Spine Wo Contrast  Addendum Date: 10/24/2018   ADDENDUM REPORT: 10/24/2018 16:33 ADDENDUM: Study discussed by telephone with PA-C JAIME WARD on 10/24/2018 at 16:32 . Electronically Signed   By: Odessa Fleming M.D.   On: 10/24/2018 16:33   Result Date: 10/24/2018 CLINICAL DATA:  78 year old female with unwitnessed fall. Found in parking lot. Confused. Blunt trauma. EXAM: CT HEAD WITHOUT CONTRAST CT MAXILLOFACIAL WITHOUT CONTRAST CT CERVICAL SPINE WITHOUT CONTRAST TECHNIQUE: Multidetector CT imaging of the head, cervical spine, and maxillofacial structures were performed using the standard protocol without intravenous contrast. Multiplanar CT image reconstructions of the cervical spine and maxillofacial structures were also generated. COMPARISON:  None. FINDINGS: CT HEAD FINDINGS Brain: Small volume subarachnoid hemorrhage over  the superolateral hemispheres left greater than right (series 5, image 23). No basilar cistern or intraventricular blood. No subdural or other extra-axial blood identified. No intracranial mass effect. No ventriculomegaly. Patchy and confluent bilateral cerebral white matter hypodensity, but abnormal gray matter hypodensity in the right inferior temporal gyrus (series 5, image 12 and coronal image 39. No significant mass effect. The mesial right temporal lobe seems to remain normal. No intra-axial blood identified. Dystrophic basal ganglia calcifications. Vascular: Calcified atherosclerosis at the skull base. No suspicious intracranial vascular hyperdensity. Skull: No calvarium fracture identified. Other: Superficial left periorbital hematoma described below. Other scalp soft tissues are within normal limits. CT MAXILLOFACIAL FINDINGS Osseous: Largely absent dentition. Mandible intact. No maxilla, zygoma, or nasal bone fracture identified. Central skull base appears intact. Orbits: Intact orbital walls. Left superior periorbital superficial scalp hematoma measuring up to 10 millimeters in thickness. Underlying left frontal bone intact. Globes and intraorbital soft tissues appear normal. Sinuses: Well pneumatized. Tympanic cavities and mastoids are clear. Negative nasal cavity aside from leftward septal deviation and trace retained secretions on the right. Soft tissues: Calcified carotid atherosclerosis in the neck. Partially  retropharyngeal course of the right carotid. Superimposed postinflammatory calcifications of the palatine tonsils. Negative parapharyngeal spaces, retropharyngeal space, sublingual space, submandibular, parotid, and masticator spaces. No other superficial soft tissue injury identified. No upper cervical lymphadenopathy. CT CERVICAL SPINE FINDINGS Alignment: Degenerative appearing mild retrolisthesis of C5 on C6 with ankylosis at C4-C5 and also C2-C3. Straightening of lordosis otherwise.  Posterior element alignment remains normal. Skull base and vertebrae: Visualized skull base is intact. No atlanto-occipital dissociation. Osteopenia. No acute osseous abnormality identified. Soft tissues and spinal canal: No prevertebral fluid or swelling. No visible canal hematoma. Calcified carotid atherosclerosis in the neck as stated above. Disc levels: Advanced C1-C2 degeneration including partially calcified ligamentous hypertrophy. Ankylosis of C2-C3 posterior elements on the left and C4-C5 vertebral bodies and right posterior elements. Advanced disc and endplate degeneration at C5-C6 with superimposed mild retrolisthesis. Mild to moderate spinal stenosis at that level. Upper chest: Grossly intact visible upper thoracic levels. Negative visible lung apices. IMPRESSION: 1. Nonspecific abnormal hypodensity in the right inferior temporal gyrus. Differential considerations include infarct, infection, encephalomalacia, and less likely tumor related edema. Recommend follow-up MRI brain without and with contrast to further characterize. 2. Superimposed Small volume posttraumatic subarachnoid hemorrhage over the convexities. No intracranial mass effect or ventriculomegaly. 3. No other acute traumatic injury to the brain identified. No fracture of the skull, face, or cervical spine identified. 4. Left periorbital superficial scalp hematoma. 5. 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 Electronically Signed: By: Odessa Fleming M.D. On: 10/24/2018 16:28   Mr Brain W And Wo Contrast  Result Date: 10/25/2018 CLINICAL DATA:  Fall with confusion.  Abnormal head CT. EXAM: MRI HEAD WITHOUT AND WITH CONTRAST TECHNIQUE: Multiplanar, multiecho pulse sequences of the brain and surrounding structures were obtained without and with intravenous contrast. CONTRAST:  5 mL Gadavist COMPARISON:  Head CT 10/24/2018 FINDINGS: BRAIN: Trace subarachnoid hemorrhage over both anterior  convexities. There is advanced diffuse volume loss with confluent hyperintense T2-weighted signal within the periventricular and deep white matter. Large area of subcortical hyperintense T2-weighted signal within the right temporal lobe with ex vacuo dilatation of the right lateral ventricle atrium and occipital horn, likely indicating encephalomalacia at the site of a remote infarct. Postcontrast images are severely motion degraded. There is mild leptomeningeal contrast enhancement at the right temporal lobe. VASCULAR: The major intracranial arterial and venous sinus flow voids are normal. SKULL AND UPPER CERVICAL SPINE: Calvarial bone marrow signal is normal. There is no skull base mass. Visualized upper cervical spine and soft tissues are normal. SINUSES/ORBITS: No fluid levels or advanced mucosal thickening. No mastoid or middle ear effusion. The orbits are normal. IMPRESSION: 1. No acute ischemia. 2. Unchanged trace subarachnoid hemorrhage over both convexities in a pattern consistent with trauma. 3. Large area of subcortical hyperintense T2-weighted signal within the right temporal lobe with associated leptomeningeal contrast enhancement. 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 4. Advanced atrophy and severe chronic ischemic white matter changes. 5. Postcontrast imaging severely motion degraded. Electronically Signed   By: Deatra Robinson M.D.   On: 10/25/2018 00:03   Ct Maxillofacial Wo Contrast  Addendum Date: 10/24/2018   ADDENDUM REPORT: 10/24/2018 16:33 ADDENDUM: Study discussed by telephone with PA-C JAIME WARD on 10/24/2018 at 16:32 . Electronically Signed   By: Odessa Fleming M.D.   On: 10/24/2018 16:33   Result Date: 10/24/2018 CLINICAL DATA:  78 year old female with unwitnessed fall. Found in parking lot. Confused. Blunt trauma. EXAM: CT  HEAD WITHOUT CONTRAST CT MAXILLOFACIAL WITHOUT CONTRAST CT CERVICAL SPINE WITHOUT CONTRAST TECHNIQUE: Multidetector CT imaging of the head,  cervical spine, and maxillofacial structures were performed using the standard protocol without intravenous contrast. Multiplanar CT image reconstructions of the cervical spine and maxillofacial structures were also generated. COMPARISON:  None. FINDINGS: CT HEAD FINDINGS Brain: Small volume subarachnoid hemorrhage over the superolateral hemispheres left greater than right (series 5, image 23). No basilar cistern or intraventricular blood. No subdural or other extra-axial blood identified. No intracranial mass effect. No ventriculomegaly. Patchy and confluent bilateral cerebral white matter hypodensity, but abnormal gray matter hypodensity in the right inferior temporal gyrus (series 5, image 12 and coronal image 39. No significant mass effect. The mesial right temporal lobe seems to remain normal. No intra-axial blood identified. Dystrophic basal ganglia calcifications. Vascular: Calcified atherosclerosis at the skull base. No suspicious intracranial vascular hyperdensity. Skull: No calvarium fracture identified. Other: Superficial left periorbital hematoma described below. Other scalp soft tissues are within normal limits. CT MAXILLOFACIAL FINDINGS Osseous: Largely absent dentition. Mandible intact. No maxilla, zygoma, or nasal bone fracture identified. Central skull base appears intact. Orbits: Intact orbital walls. Left superior periorbital superficial scalp hematoma measuring up to 10 millimeters in thickness. Underlying left frontal bone intact. Globes and intraorbital soft tissues appear normal. Sinuses: Well pneumatized. Tympanic cavities and mastoids are clear. Negative nasal cavity aside from leftward septal deviation and trace retained secretions on the right. Soft tissues: Calcified carotid atherosclerosis in the neck. Partially retropharyngeal course of the right carotid. Superimposed postinflammatory calcifications of the palatine tonsils. Negative parapharyngeal spaces, retropharyngeal space,  sublingual space, submandibular, parotid, and masticator spaces. No other superficial soft tissue injury identified. No upper cervical lymphadenopathy. CT CERVICAL SPINE FINDINGS Alignment: Degenerative appearing mild retrolisthesis of C5 on C6 with ankylosis at C4-C5 and also C2-C3. Straightening of lordosis otherwise. Posterior element alignment remains normal. Skull base and vertebrae: Visualized skull base is intact. No atlanto-occipital dissociation. Osteopenia. No acute osseous abnormality identified. Soft tissues and spinal canal: No prevertebral fluid or swelling. No visible canal hematoma. Calcified carotid atherosclerosis in the neck as stated above. Disc levels: Advanced C1-C2 degeneration including partially calcified ligamentous hypertrophy. Ankylosis of C2-C3 posterior elements on the left and C4-C5 vertebral bodies and right posterior elements. Advanced disc and endplate degeneration at C5-C6 with superimposed mild retrolisthesis. Mild to moderate spinal stenosis at that level. Upper chest: Grossly intact visible upper thoracic levels. Negative visible lung apices. IMPRESSION: 1. Nonspecific abnormal hypodensity in the right inferior temporal gyrus. Differential considerations include infarct, infection, encephalomalacia, and less likely tumor related edema. Recommend follow-up MRI brain without and with contrast to further characterize. 2. Superimposed Small volume posttraumatic subarachnoid hemorrhage over the convexities. No intracranial mass effect or ventriculomegaly. 3. No other acute traumatic injury to the brain identified. No fracture of the skull, face, or cervical spine identified. 4. Left periorbital superficial scalp hematoma. 5. 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 Electronically Signed: By: Odessa Fleming M.D. On: 10/24/2018 16:28        Scheduled Meds:  amLODipine  5 mg Oral Daily   atenolol  100 mg Oral Daily    lisinopril  20 mg Oral Daily   And   hydrochlorothiazide  12.5 mg Oral Daily   multivitamin  1 tablet Oral Daily   pantoprazole  40 mg Oral Daily   potassium chloride  40 mEq Oral BID WC   simvastatin  20 mg Oral QPM  Continuous Infusions:  sodium chloride 75 mL/hr at 10/24/18 2029   magnesium sulfate bolus IVPB       LOS: 2 days    Time spent: 36 minutes.     Kathlen Mody, MD [facility] Pager [phone]   If 7PM-7AM, please contact night-coverage [email or web address] Password [facility] 10/26/2018, 10:33 AM

## 2018-10-26 NOTE — Progress Notes (Signed)
Patient had a fall @0005 , V/S stable, no injury noted, patient denies pain. NP notified, family was called. Message was left on [phone] for [facility].

## 2018-10-26 NOTE — Evaluation (Signed)
Occupational Therapy Evaluation Patient Details Name: Tammy Navarro BoardsLoretta J Haller MRN: [id number] DOB: 12/29/1940 Today's Date: 10/26/2018    History of Present Illness Pt is a 78 y/o female found after [place] [employer], unwittnessed fall with no recall of the event.  Imaging reveals small subarachnoid hemorrhage. PMH: HTN, a fib.    Clinical Impression   PTA patient reports independent and driving.  Admitted for above and limited by cognition, generalized weakness, impaired balance and decreased activity tolerance.  Pt oriented to self only, requires increased time to process and follow simple 1 step commands (inconsistently), and presents with poor memory/decreased task initation.  Pt able to complete bed mobility with mod assist, transfers with min assist +2, grooming at sink with min-mod assist, toileting with total assist and LB ADLs with total assist.  Pt incontinent with emesis in hallway during mobility, suspect concussion/TBI.  Patient will benefit from continued OT services while admitted and after dc at SNF level in order to optimize independence and safety with ADLs/mobility. Will follow with further cognitive assessment.     Follow Up Recommendations  SNF;Supervision/Assistance - 24 hour    Equipment Recommendations  Other (comment)(TBD at next venue of care)    Recommendations for Other Services  SPEECH CONSULT     Precautions / Restrictions Precautions Precautions: Fall Restrictions Weight Bearing Restrictions: No      Mobility Bed Mobility Overal bed mobility: Needs Assistance Bed Mobility: Supine to Sit     Supine to sit: Mod assist     General bed mobility comments: multimodal cueing to initate movements, mod assist to bring LEs to EOB and fully ascend trunk  Transfers Overall transfer level: Needs assistance Equipment used: 2 person hand held assist Transfers: Sit to/from Stand Sit to Stand: Min assist;+2 safety/equipment         General  transfer comment: to ascend, for balance and safety; multimodal cueing with poor initation of task    Balance Overall balance assessment: Needs assistance Sitting-balance support: No upper extremity supported;Feet supported Sitting balance-Leahy Scale: Fair     Standing balance support: Bilateral upper extremity supported;During functional activity Standing balance-Leahy Scale: Poor Standing balance comment: relaint on BUE and external support                           ADL either performed or assessed with clinical judgement   ADL Overall ADL's : Needs assistance/impaired     Grooming: Minimal assistance;Standing Grooming Details (indicate cue type and reason): min assist (+2 safety standing) with cueing to sequencing hand washing task  Upper Body Bathing: Minimal assistance;Sitting   Lower Body Bathing: Total assistance;+2 for safety/equipment;Sit to/from stand   Upper Body Dressing : Minimal assistance;Sitting   Lower Body Dressing: Total assistance;+2 for safety/equipment;Sit to/from stand   Toilet Transfer: Minimal assistance;+2 for safety/equipment;Ambulation Toilet Transfer Details (indicate cue type and reason): 2 person hand held assist simulated to recliner  Toileting- Clothing Manipulation and Hygiene: Total assistance;+2 for safety/equipment;Sit to/from stand Toileting - Clothing Manipulation Details (indicate cue type and reason): incontinent of bladder x 2 during session     Functional mobility during ADLs: Minimal assistance;+2 for safety/equipment General ADL Comments: pt limited by cognition, impaired balance, generalized weakness and decreased activity tolerance     Vision   Additional Comments: further assessment required     Perception     Praxis      Pertinent Vitals/Pain Pain Assessment: No/denies pain     Hand  Dominance     Extremity/Trunk Assessment Upper Extremity Assessment Upper Extremity Assessment: Generalized weakness    Lower Extremity Assessment Lower Extremity Assessment: Defer to PT evaluation   Cervical / Trunk Assessment Cervical / Trunk Assessment: Kyphotic   Communication     Cognition Arousal/Alertness: Lethargic Behavior During Therapy: Flat affect Overall Cognitive Status: Impaired/Different from baseline Area of Impairment: Orientation;Attention;Memory;Following commands;Safety/judgement;Awareness;Problem solving                 Orientation Level: Disoriented to;Place;Time;Situation Current Attention Level: Focused Memory: Decreased recall of precautions;Decreased short-term memory Following Commands: Follows one step commands inconsistently;Follows one step commands with increased time Safety/Judgement: Decreased awareness of safety;Decreased awareness of deficits Awareness: Intellectual Problem Solving: Slow processing;Decreased initiation;Difficulty sequencing;Requires verbal cues;Requires tactile cues General Comments: pt oriented to self only, verbalizes "I don't know", requires increased time to process and follow commands (inconsistently), poor problem solving and awareness-- continue assessment, possible concusion?    General Comments  pt incontinent of bladder upon entry, then again during mobility in hallway (along with emesis)- RN present and aware    Exercises     Shoulder Instructions      Home Living Family/patient expects to be discharged to:: Private residence Living Arrangements: Alone Available Help at Discharge: Family;Available PRN/intermittently Type of Home: House                                  Prior Functioning/Environment Level of Independence: Independent        Comments: independent and driving         OT Problem List: Decreased strength;Decreased range of motion;Decreased activity tolerance;Impaired balance (sitting and/or standing);Decreased coordination;Decreased cognition;Decreased safety awareness;Decreased knowledge of  use of DME or AE;Decreased knowledge of precautions      OT Treatment/Interventions: Self-care/ADL training;Therapeutic exercise;DME and/or AE instruction;Therapeutic activities;Cognitive remediation/compensation;Patient/family education;Balance training    OT Goals(Current goals can be found in the care plan section) Acute Rehab OT Goals Patient Stated Goal: none stated OT Goal Formulation: Patient unable to participate in goal setting Time For Goal Achievement: 11/09/18 Potential to Achieve Goals: Good  OT Frequency: Min 2X/week   Barriers to D/C:            Co-evaluation PT/OT/SLP Co-Evaluation/Treatment: Yes Reason for Co-Treatment: Complexity of the patient's impairments (multi-system involvement)   OT goals addressed during session: ADL's and self-care      AM-PAC OT "6 Clicks" Daily Activity     Outcome Measure Help from another person eating meals?: A Little Help from another person taking care of personal grooming?: A Little Help from another person toileting, which includes using toliet, bedpan, or urinal?: Total Help from another person bathing (including washing, rinsing, drying)?: A Lot Help from another person to put on and taking off regular upper body clothing?: A Little Help from another person to put on and taking off regular lower body clothing?: Total 6 Click Score: 13   End of Session Equipment Utilized During Treatment: Gait belt Nurse Communication: Mobility status;Precautions  Activity Tolerance: Patient tolerated treatment well Patient left: in chair;with call bell/phone within reach;with chair alarm set;with nursing/sitter in room  OT Visit Diagnosis: Other abnormalities of gait and mobility (R26.89);Muscle weakness (generalized) (M62.81);Other symptoms and signs involving cognitive function                Time: 5621-3086 OT Time Calculation (min): 31 min Charges:  OT General Charges $OT Visit: 1 Visit OT Evaluation $OT  Eval Moderate  Complexity: 1 Mod  Chancy Milroy, [place] Acute Rehabilitation Services Pager [phone] Office [phone]   Chancy Milroy 10/26/2018, 1:19 PM

## 2018-10-26 NOTE — Evaluation (Signed)
Physical Therapy Evaluation Patient Details Name: Tammy Navarro MRN: [id number] DOB: [birth year] Today's Date: 10/26/2018   History of Present Illness  Pt is a 78 y/o female found after falling at [employer] lot, unwittnessed fall with no recall of the event.  Imaging reveals small subarachnoid hemorrhage. PMH: HTN, a fib.   Clinical Impression  Pt admitted with above. Due to patient falling on face/head in parking lot and noted vomitting in hallway suspect pt with concussion/TBI. Pt with poor memory and recall, not oriented, and incontinent. Pt was indep and driving PTA but now requires modA for all mobility and ADLs. Pt to benefit from ST-SNF upon d/c for maximal functional recovery. Acute PT to follow.    Follow Up Recommendations SNF;Supervision/Assistance - 24 hour    Equipment Recommendations  (TBD)    Recommendations for Other Services       Precautions / Restrictions Precautions Precautions: Fall Restrictions Weight Bearing Restrictions: No      Mobility  Bed Mobility Overal bed mobility: Needs Assistance Bed Mobility: Supine to Sit     Supine to sit: Mod assist     General bed mobility comments: pt sitting EOB upon PT arrival  Transfers Overall transfer level: Needs assistance Equipment used: 2 person hand held assist Transfers: Sit to/from Stand Sit to Stand: Min assist;+2 safety/equipment         General transfer comment: to ascend, for balance and safety; multimodal cueing with poor initation of task  Ambulation/Gait Ambulation/Gait assistance: Mod assist;+2 physical assistance Gait Distance (Feet): 100 Feet Assistive device: 2 person hand held assist Gait Pattern/deviations: Step-through pattern;Decreased stride length;Trunk flexed Gait velocity: slow Gait velocity interpretation: <1.8 ft/sec, indicate of risk for recurrent falls General Gait Details: pt slow requiring max tactile cues to cont forward movement. pt stopped s/p 39' and began  to spit and vomitting in addition to having urinary incontinence  Stairs            Wheelchair Mobility    Modified Rankin (Stroke Patients Only)       Balance Overall balance assessment: Needs assistance Sitting-balance support: No upper extremity supported;Feet supported Sitting balance-Leahy Scale: Fair     Standing balance support: Bilateral upper extremity supported;During functional activity Standing balance-Leahy Scale: Poor Standing balance comment: relaint on BUE and external support                             Pertinent Vitals/Pain Pain Assessment: No/denies pain    Home Living Family/patient expects to be discharged to:: Private residence Living Arrangements: Alone Available Help at Discharge: Family;Available PRN/intermittently Type of Home: House           Additional Comments: pt is poor historian, per chart pt was shopping at [employer] and indep PTA    Prior Function Level of Independence: Independent         Comments: independent and driving      Hand Dominance        Extremity/Trunk Assessment   Upper Extremity Assessment Upper Extremity Assessment: Generalized weakness    Lower Extremity Assessment Lower Extremity Assessment: Generalized weakness    Cervical / Trunk Assessment Cervical / Trunk Assessment: Kyphotic  Communication   Communication: (slow to respond)  Cognition Arousal/Alertness: Lethargic Behavior During Therapy: Flat affect Overall Cognitive Status: Impaired/Different from baseline Area of Impairment: Orientation;Attention;Memory;Following commands;Safety/judgement;Awareness;Problem solving                 Orientation Level:  Disoriented to;Place;Time;Situation Current Attention Level: Focused Memory: Decreased recall of precautions;Decreased short-term memory Following Commands: Follows one step commands inconsistently;Follows one step commands with increased time Safety/Judgement:  Decreased awareness of safety;Decreased awareness of deficits Awareness: Intellectual Problem Solving: Slow processing;Decreased initiation;Difficulty sequencing;Requires verbal cues;Requires tactile cues General Comments: pt oriented to self only, verbalizes "I don't know", requires increased time to process and follow commands (inconsistently), poor problem solving and awareness-- continue assessment, possible concusion?       General Comments General comments (skin integrity, edema, etc.): pt incontinent of urine    Exercises     Assessment/Plan    PT Assessment Patient needs continued PT services  PT Problem List Decreased strength;Decreased range of motion;Decreased activity tolerance;Decreased balance;Decreased mobility;Decreased coordination;Decreased cognition;Decreased knowledge of use of DME;Decreased safety awareness       PT Treatment Interventions DME instruction;Gait training;Stair training;Functional mobility training;Therapeutic activities;Therapeutic exercise;Balance training;Neuromuscular re-education    PT Goals (Current goals can be found in the Care Plan section)  Acute Rehab PT Goals Patient Stated Goal: none stated PT Goal Formulation: Patient unable to participate in goal setting Time For Goal Achievement: 11/09/18 Potential to Achieve Goals: Good    Frequency Min 3X/week   Barriers to discharge Decreased caregiver support lives alone    Co-evaluation PT/OT/SLP Co-Evaluation/Treatment: Yes Reason for Co-Treatment: Complexity of the patient's impairments (multi-system involvement) PT goals addressed during session: Mobility/safety with mobility OT goals addressed during session: ADL's and self-care       AM-PAC PT "6 Clicks" Mobility  Outcome Measure Help needed turning from your back to your side while in a flat bed without using bedrails?: A Lot Help needed moving from lying on your back to sitting on the side of a flat bed without using  bedrails?: A Lot Help needed moving to and from a bed to a chair (including a wheelchair)?: A Lot Help needed standing up from a chair using your arms (e.g., wheelchair or bedside chair)?: A Lot Help needed to walk in hospital room?: A Lot Help needed climbing 3-5 steps with a railing? : Total 6 Click Score: 11    End of Session Equipment Utilized During Treatment: Gait belt Activity Tolerance: Patient tolerated treatment well Patient left: in chair;with call bell/phone within reach;with chair alarm set Nurse Communication: Mobility status PT Visit Diagnosis: Unsteadiness on feet (R26.81);Muscle weakness (generalized) (M62.81);Difficulty in walking, not elsewhere classified (R26.2)    Time: [id number] PT Time Calculation (min) (ACUTE ONLY): 33 min   Charges:   PT Evaluation $PT Eval Moderate Complexity: 1 Mod          Lewis ShockAshly Loretha Ure, PT, DPT Acute Rehabilitation Services Pager #: [phone] Office #: [phone]   Iona Hansenshly M Aryahi Denzler 10/26/2018, 1:24 PM

## 2018-10-27 ENCOUNTER — Inpatient Hospital Stay (HOSPITAL_COMMUNITY): Payer: Medicare Other

## 2018-10-27 LAB — BASIC METABOLIC PANEL
Anion gap: 13 (ref 5–15)
BUN: 5 mg/dL — ABNORMAL LOW (ref 8–23)
CO2: 21 mmol/L — ABNORMAL LOW (ref 22–32)
Calcium: 8.8 mg/dL — ABNORMAL LOW (ref 8.9–10.3)
Chloride: 95 mmol/L — ABNORMAL LOW (ref 98–111)
Creatinine, Ser: 0.69 mg/dL (ref 0.44–1.00)
GFR calc Af Amer: >60 mL/min (ref >60)
GFR calc non Af Amer: >60 mL/min (ref >60)
Glucose, Bld: 105 mg/dL — ABNORMAL HIGH (ref 70–99)
Potassium: 3.6 mmol/L (ref 3.5–5.1)
Sodium: 129 mmol/L — ABNORMAL LOW (ref 135–145)

## 2018-10-27 LAB — CBC
HCT: 37.8 % (ref 36.0–46.0)
Hemoglobin: 13.7 g/dL (ref 12.0–15.0)
MCH: 31.5 pg (ref 26.0–34.0)
MCHC: 36.2 g/dL — ABNORMAL HIGH (ref 30.0–36.0)
MCV: 86.9 fL (ref 80.0–100.0)
Platelets: 259 10*3/uL (ref 150–400)
RBC: 4.35 MIL/uL (ref 3.87–5.11)
RDW: 12.5 % (ref 11.5–15.5)
WBC: 10 10*3/uL (ref 4.0–10.5)
nRBC: 0 % (ref 0.0–0.2)

## 2018-10-27 LAB — MAGNESIUM: Magnesium: 1.9 mg/dL (ref 1.7–2.4)

## 2018-10-27 LAB — GLUCOSE, CAPILLARY
Glucose-Capillary: 131 mg/dL — ABNORMAL HIGH (ref 70–99)
Glucose-Capillary: 114 mg/dL — ABNORMAL HIGH (ref 70–99)

## 2018-10-27 LAB — CORTISOL: Cortisol, Plasma: 20 ug/dL

## 2018-10-27 LAB — OSMOLALITY: Osmolality: 264 mOsm/kg — ABNORMAL LOW (ref 275–295)

## 2018-10-27 MED ORDER — LORAZEPAM 2 MG/ML IJ SOLN
0.5000 mg | Freq: Once | INTRAMUSCULAR | Status: AC
  Administered 2018-10-27: 0.5 mg via INTRAVENOUS
  Filled 2018-10-27: qty 1

## 2018-10-27 NOTE — Progress Notes (Signed)
PROGRESS NOTE    Tammy Navarro  ZOX:[id number] DOB: 09/19/1940 DOA: 10/24/2018 PCP: [name], [name], MD   Brief Narrative:  78 year old with history of hypertension found [place] in the [employer] [place] after sustaining a fall.  This was not witnessed.  Bystander called 911 and she was brought to the hospital.  She was found to have blood all over the left side of the face.  In the ER she was awake does not have recollection of what had happened.  CT of the head showed small subarachnoid hemorrhage with possible ischemic stroke.  She was also in appears to be new onset atrial fibrillation.  Neurology team was consulted.  MRI of the brain was negative for any acute stroke.  Repeat CT of the head the following day showed improvement in subarachnoid hemorrhage.  EEG performed was negative.  Echocardiogram showed ejection fraction 60 to 65% with negative bubble study.  Hemoglobin A1c 5.0, LDL 72.  TSH and B12 within normal limits.  After extensive discussion with the patient's family member, it was decided to start patient on daily aspirin prior to discharge.   Assessment & Plan:   Principal Problem:   SAH (subarachnoid hemorrhage) (HCC) Active Problems:   Fall at home, initial encounter   HTN (hypertension)   New onset a-fib (HCC)   CVA (cerebral vascular accident) (HCC)   Hyponatremia   Hypokalemia  Traumatic subarachnoid hemorrhage secondary to fall Hypodensity in the right temporal lobe concerning for acute versus chronic stroke -CT of the head showed superficial hematoma and subarachnoid hemorrhage.  Repeat CT head this morning showed improvement in her subarachnoid hemorrhage.  No focal deficits noted at this time.  MRI of the brain is negative for acute stroke.  Echocardiogram showed ejection fraction 60 to 65%, LDL 72, hemoglobin A1c 5.0, TSH and B12 within normal limits.  Physical therapy and occupational therapy recommended skilled nursing facility  Acute metabolic  encephalopathy, persists -Unknown exact etiology.  Patient is alert to her name but does not appear to be at baseline yet.  Per patient's son at baseline she is able to carry on a basic conversation and ambulate without any assistance.  New onset atrial fibrillation -[place]  The risk of falling she will only be on aspirin 81 mg of the time of discharge.  I had an extensive discussion about this with the patient's son who was in the agreement.  Hyperlipidemia -Continue current statin.  Hyponatremia -Likely secondary to dehydration and hydrochlorothiazide use which are on hold.  Getting gentle hydration.  PT/OT evaluation performed.  SNF recommended.  SNF appropriate as the patient has received 3 days of hospital care (or the 3 days stay Has been made by the patient's insurance company) and is felt to need rehab services to restore this patient to their prior level of function to achieve safe transition back to home care.  This patient needs rehab services for at least 5 days/week and skilled nursing services daily to facilitate this transition.  Rehab is been requested as the most appropriate discharge option for this patient and is not felt to be custodial care as evidenced by patient able to ambulate without minimal assistance prior to this.  She is able to carry on a basic conversation.   DVT prophylaxis: None Code Status: Code Family Communication: Spoke with the patient's son over the phone, Ray Disposition Plan: Maintain hospital stay for at least 24 hours until her mentation status is improved.  Currently she is  only alert to her name and unable to carry on a basic conversation.  At baseline she is able to carry on a basic conversation  Consultants:   Neurology  Procedures:   None  Antimicrobials:   None   Subjective: Patient is only alert to her name this morning.  She has no recollection of the events that led her to come to the hospital.  She is trying to get out  of the bed  Review of Systems Otherwise negative except as per HPI, including: General: Denies fever, chills, night sweats or unintended weight loss. Resp: Denies cough, wheezing, shortness of breath. Cardiac: Denies chest pain, palpitations, orthopnea, paroxysmal nocturnal dyspnea. GI: Denies abdominal pain, nausea, vomiting, diarrhea or constipation GU: Denies dysuria, frequency, hesitancy or incontinence MS: Denies muscle aches, joint pain or swelling Neuro: Denies headache, neurologic deficits (focal weakness, numbness, tingling), abnormal gait Psych: Denies anxiety, depression, SI/HI/AVH Skin: Denies new rashes or lesions ID: Denies sick contacts, exotic exposures, travel  Objective: Vitals:   10/26/18 2054 10/26/18 2333 10/27/18 0325 10/27/18 0812  BP: (!) 141/81 (!) 166/95 (!) 166/89 132/74  Pulse: 85 85 71 77  Resp: Temp: 98.5 F (36.9 C) 98.5 F (36.9 C) 98.3 F (36.8 C) 98.3 F (36.8 C)  TempSrc: Oral Oral Oral Oral  SpO2: 98% 99% 98% 98%  Weight:      Height:        Intake/Output Summary (Last 24 hours) at 10/27/2018 1128 Last data filed at 10/26/2018 1545 Gross per 24 hour  Intake 80 ml  Output --  Net 80 ml   Filed Weights   10/24/18 1509  Weight: 59 kg    Examination:  General exam: Appears calm and comfortable, not in acute distress Respiratory system: Clear to auscultation. Respiratory effort normal. Cardiovascular system: S1 & S2 heard, RRR. No JVD, murmurs, rubs, gallops or clicks. No pedal edema. Gastrointestinal system: Abdomen is nondistended, soft and nontender. No organomegaly or masses felt. Normal bowel sounds heard. Central nervous system: Alert and oriented only to her name. No focal neurological deficits. Extremities: Symmetric 5 x 5 power. Skin: Left-sided facial bruising Psychiatry: Poor judgment and insight    Data Reviewed:   CBC: Recent Labs  Lab 10/24/18 1545 10/25/18 0331 10/26/18 0440 10/27/18 0351  WBC  6.7 7.7 5.8 10.0  NEUTROABS 5.0  --   --   --   HGB 14.2 13.0 13.2 13.7  HCT 39.3 35.8* 37.4 37.8  MCV 86.4 87.3 87.8 86.9  PLT 264 232 239 259   Basic Metabolic Panel: Recent Labs  Lab 10/24/18 1545 10/25/18 0331 10/25/18 1525 10/26/18 0440 10/27/18 0351  NA 124* 130*  --  130* 129*  K 2.8* 2.7* 3.2* 2.9* 3.6  CL 81* 92*  --  90* 95*  CO2 25 25  --  22 21*  GLUCOSE 97 80  --  78 105*  BUN 15 11  --  9 5*  CREATININE 0.64 0.62  --  0.65 0.69  CALCIUM 9.1 8.8*  --  9.0 8.8*  MG  --   --  1.7 1.6* 1.9   GFR: Estimated Creatinine Clearance: 53.1 mL/min (by C-G formula based on SCr of 0.69 mg/dL). Liver Function Tests: Recent Labs  Lab 10/24/18 1545 10/25/18 0331  AST 29 26  ALT 16 14  ALKPHOS 70 57  BILITOT 1.1 1.3*  PROT 6.8 6.0*  ALBUMIN 3.7 3.1*   No results for input(s): LIPASE, AMYLASE in the  last 168 hours. No results for input(s): AMMONIA in the last 168 hours. Coagulation Profile: No results for input(s): INR, PROTIME in the last 168 hours. Cardiac Enzymes: Recent Labs  Lab 10/24/18 1545  TROPONINI <0.03   BNP (last 3 results) No results for input(s): PROBNP in the last 8760 hours. HbA1C: Recent Labs    10/25/18 1540  HGBA1C 5.0   CBG: No results for input(s): GLUCAP in the last 168 hours. Lipid Profile: Recent Labs    10/26/18 0440  CHOL 135  HDL 55  LDLCALC 72  TRIG 39  CHOLHDL 2.5   Thyroid Function Tests: Recent Labs    10/26/18 1130  TSH 2.222   Anemia Panel: Recent Labs    10/26/18 1130  VITAMINB12 514   Sepsis Labs: No results for input(s): PROCALCITON, LATICACIDVEN in the last 168 hours.  Recent Results (from the past 240 hour(s))  SARS Coronavirus 2 (CEPHEID - Performed in [facility] hospital lab), Hosp Order     Status: None   Collection Time: 10/24/18  7:17 PM  Result Value Ref Range Status   SARS Coronavirus 2 NEGATIVE NEGATIVE Final    Comment: (NOTE) If result is NEGATIVE SARS-CoV-2 target nucleic acids are  NOT DETECTED. The SARS-CoV-2 RNA is generally detectable in upper and lower  respiratory specimens during the acute phase of infection. The lowest  concentration of SARS-CoV-2 viral copies this assay can detect is 250  copies / mL. A negative result does not preclude SARS-CoV-2 infection  and should not be used as the sole basis for treatment or other  patient management decisions.  A negative result may occur with  improper specimen collection / handling, submission of specimen other  than nasopharyngeal swab, presence of viral mutation(s) within the  areas targeted by this assay, and inadequate number of viral copies  (<250 copies / mL). A negative result must be combined with clinical  observations, patient history, and epidemiological information. If result is POSITIVE SARS-CoV-2 target nucleic acids are DETECTED. The SARS-CoV-2 RNA is generally detectable in upper and lower  respiratory specimens dur ing the acute phase of infection.  Positive  results are indicative of active infection with SARS-CoV-2.  Clinical  correlation with patient history and other diagnostic information is  necessary to determine patient infection status.  Positive results do  not rule out bacterial infection or co-infection with other viruses. If result is PRESUMPTIVE POSTIVE SARS-CoV-2 nucleic acids MAY BE PRESENT.   A presumptive positive result was obtained on the submitted specimen  and confirmed on repeat testing.  While 2019 novel coronavirus  (SARS-CoV-2) nucleic acids may be present in the submitted sample  additional confirmatory testing may be necessary for epidemiological  and / or clinical management purposes  to differentiate between  SARS-CoV-2 and other Sarbecovirus currently known to infect humans.  If clinically indicated additional testing with an alternate test  methodology [phone]) is advised. The SARS-CoV-2 RNA is generally  detectable in upper and lower respiratory sp ecimens  during the acute  phase of infection. The expected result is Negative. Fact Sheet for Patients:  BoilerBrush.com.cy Fact Sheet for Healthcare Providers: https://pope.com/ This test is not yet approved or cleared by the [place] FDA and has been authorized for detection and/or diagnosis of SARS-CoV-2 by FDA under an Emergency Use Authorization (EUA).  This EUA will remain in effect (meaning this test can be used) for the duration of the COVID-19 declaration under Section 564(b)(1) of the Act, 21 U.S.C. section 360bbb-3(b)(1), unless  the authorization is terminated or revoked sooner. Performed at [facility], [address], [place], [place] [address]          Radiology Studies: Ct Angio Head W Or Wo Contrast  Result Date: 10/26/2018 CLINICAL DATA:  Unwitnessed fall, with evidence of acute subarachnoid hemorrhage and subacute RIGHT temporal lobe infarct. History of atrial fibrillation. EXAM: CT ANGIOGRAPHY HEAD AND NECK TECHNIQUE: Multidetector CT imaging of the head and neck was performed using the standard protocol during bolus administration of intravenous contrast. Multiplanar CT image reconstructions and MIPs were obtained to evaluate the vascular anatomy. Carotid stenosis measurements (when applicable) are obtained utilizing NASCET criteria, using the distal internal carotid diameter as the denominator. CONTRAST:  75mL OMNIPAQUE IOHEXOL 350 MG/ML SOLN COMPARISON:  MR head 10/24/2018. CT head 10/24/2018 FINDINGS: CT HEAD FINDINGS Brain: Expected improvement in bifrontal subarachnoid hemorrhage. Increased prominence of a delayed intracerebral hematoma, LEFT frontal cortex, approximately 10 x 10 x 12 mm. Mild surrounding edema. No midline shift. Elsewhere atrophy with chronic microvascular ischemic change. Hypoattenuation throughout much of the RIGHT temporal lobe representing subacute infarct. Vascular: Reported separately Skull:  Intact. LEFT frontal scalp hematoma. Sinuses: No layering fluid. Orbits: No acute finding. Review of the MIP images confirms the above findings CTA NECK FINDINGS Aortic arch: Standard branching. Imaged portion shows no evidence of aneurysm or dissection. No significant stenosis of the major arch vessel origins. Right carotid system: No evidence of dissection, stenosis (50% or greater) or occlusion. Minor atheromatous calcification. Left carotid system: No evidence of dissection, stenosis (50% or greater) or occlusion. Minor atheromatous calcification. Vertebral arteries: Dominant LEFT vertebral without significant ostial disease. Non dominant RIGHT vertebral patent throughout, with a 50-75% ostial stenosis. No significant stenosis in the neck throughout the V2 or V3 segments. Skeleton: No cervical spine fracture. Multilevel spondylosis. Other neck: No neck masses. Upper chest: No pneumothorax or mass. Review of the MIP images confirms the above findings CTA HEAD FINDINGS Anterior circulation: Nonstenotic calcification of the cavernous and supraclinoid internal carotid arteries bilaterally. No significant intracranial stenosis, occlusion, saccular aneurysm or vascular malformation. Posterior circulation: No significant stenosis, proximal occlusion, aneurysm, or vascular malformation. LEFT vertebral dominant. RIGHT vertebral diminutive after takeoff of the RIGHT PICA. Venous sinuses: As permitted by contrast timing, patent. Anatomic variants: Fetal LEFT PCA, of no significance. Delayed phase: Mild postcontrast enhancement surrounding the delayed intracerebral hematoma, less so around the subacute RIGHT temporal infarct. Review of the MIP images confirms the above findings IMPRESSION: 1. No extracranial or intracranial stenosis of significance. 2. Resolving intracranial subarachnoid hemorrhage. 3. Increasing prominence of a delayed intracerebral hematoma, LEFT frontal cortex, 10 x 10 x 12 mm. Mild surrounding edema. 4.  Subacute RIGHT temporal infarct, stable from priors. Electronically Signed   By: Elsie Stain M.D.   On: 10/26/2018 10:50   Ct Head Wo Contrast  Result Date: 10/27/2018 CLINICAL DATA:  Subarachnoid hemorrhage follow up EXAM: CT HEAD WITHOUT CONTRAST TECHNIQUE: Contiguous axial images were obtained from the base of the skull through the vertex without intravenous contrast. COMPARISON:  Head CT 10/26/2018 FINDINGS: Brain: Small area of parenchymal hematoma at the left frontal cortex is unchanged, measuring 10 x 8 mm. Trace subarachnoid blood is present over the right convexity. No new site of hemorrhage. No midline shift or mass effect. Unchanged appearance of subacute ischemia in the right temporal lobe. Vascular: No abnormal hyperdensity of the major intracranial arteries or dural venous sinuses. No intracranial atherosclerosis. Skull: The visualized skull base, calvarium and extracranial  soft tissues are normal. Sinuses/Orbits: No fluid levels or advanced mucosal thickening of the visualized paranasal sinuses. No mastoid or middle ear effusion. The orbits are normal. IMPRESSION: 1. Unchanged intraparenchymal hematoma in the left frontal lobe without mass effect. 2. Near resolution of subarachnoid blood with trace residual focus over the right convexity. Electronically Signed   By: Deatra Robinson M.D.   On: 10/27/2018 01:09   Ct Angio Neck W Or Wo Contrast  Result Date: 10/26/2018 CLINICAL DATA:  Unwitnessed fall, with evidence of acute subarachnoid hemorrhage and subacute RIGHT temporal lobe infarct. History of atrial fibrillation. EXAM: CT ANGIOGRAPHY HEAD AND NECK TECHNIQUE: Multidetector CT imaging of the head and neck was performed using the standard protocol during bolus administration of intravenous contrast. Multiplanar CT image reconstructions and MIPs were obtained to evaluate the vascular anatomy. Carotid stenosis measurements (when applicable) are obtained utilizing NASCET criteria, using the  distal internal carotid diameter as the denominator. CONTRAST:  75mL OMNIPAQUE IOHEXOL 350 MG/ML SOLN COMPARISON:  MR head 10/24/2018. CT head 10/24/2018 FINDINGS: CT HEAD FINDINGS Brain: Expected improvement in bifrontal subarachnoid hemorrhage. Increased prominence of a delayed intracerebral hematoma, LEFT frontal cortex, approximately 10 x 10 x 12 mm. Mild surrounding edema. No midline shift. Elsewhere atrophy with chronic microvascular ischemic change. Hypoattenuation throughout much of the RIGHT temporal lobe representing subacute infarct. Vascular: Reported separately Skull: Intact. LEFT frontal scalp hematoma. Sinuses: No layering fluid. Orbits: No acute finding. Review of the MIP images confirms the above findings CTA NECK FINDINGS Aortic arch: Standard branching. Imaged portion shows no evidence of aneurysm or dissection. No significant stenosis of the major arch vessel origins. Right carotid system: No evidence of dissection, stenosis (50% or greater) or occlusion. Minor atheromatous calcification. Left carotid system: No evidence of dissection, stenosis (50% or greater) or occlusion. Minor atheromatous calcification. Vertebral arteries: Dominant LEFT vertebral without significant ostial disease. Non dominant RIGHT vertebral patent throughout, with a 50-75% ostial stenosis. No significant stenosis in the neck throughout the V2 or V3 segments. Skeleton: No cervical spine fracture. Multilevel spondylosis. Other neck: No neck masses. Upper chest: No pneumothorax or mass. Review of the MIP images confirms the above findings CTA HEAD FINDINGS Anterior circulation: Nonstenotic calcification of the cavernous and supraclinoid internal carotid arteries bilaterally. No significant intracranial stenosis, occlusion, saccular aneurysm or vascular malformation. Posterior circulation: No significant stenosis, proximal occlusion, aneurysm, or vascular malformation. LEFT vertebral dominant. RIGHT vertebral diminutive after  takeoff of the RIGHT PICA. Venous sinuses: As permitted by contrast timing, patent. Anatomic variants: Fetal LEFT PCA, of no significance. Delayed phase: Mild postcontrast enhancement surrounding the delayed intracerebral hematoma, less so around the subacute RIGHT temporal infarct. Review of the MIP images confirms the above findings IMPRESSION: 1. No extracranial or intracranial stenosis of significance. 2. Resolving intracranial subarachnoid hemorrhage. 3. Increasing prominence of a delayed intracerebral hematoma, LEFT frontal cortex, 10 x 10 x 12 mm. Mild surrounding edema. 4. Subacute RIGHT temporal infarct, stable from priors. Electronically Signed   By: Elsie Stain M.D.   On: 10/26/2018 10:50        Scheduled Meds:  atenolol  100 mg Oral Daily   lisinopril  20 mg Oral Daily   multivitamin  1 tablet Oral Daily   pantoprazole  40 mg Oral Daily   simvastatin  20 mg Oral QPM   Continuous Infusions:  sodium chloride 75 mL/hr at 10/24/18 2029     LOS: 3 days   Time spent=    Maria Coin Joline Maxcy, MD  [facility]  If 7PM-7AM, please contact night-coverage [email or web address] 10/27/2018, 11:28 AM

## 2018-10-27 NOTE — Progress Notes (Signed)
Physical Therapy Treatment Patient Details Name: Tammy Navarro Folson MRN: [id number] DOB: 08/12/1940 Today's Date: 10/27/2018    History of Present Illness Pt is a 78 y/o female found after falling [place] [employer], unwittnessed fall with no recall of the event.  Imaging reveals small subarachnoid hemorrhage. PMH: HTN, a fib.     PT Comments    Session focused on functional mobility of bed mobility, sit to stand transfers and ambulation. She still requires +2 for safety and balance. RW was trialed today and she was able to use RW but is still unsteady with it and needs help with turning and managing RW at times. She also still freezes at times during ambulation and needs cuing and manual facilitation to continue walking. PT now recommending SNF due to her continue impairments.   Follow Up Recommendations  SNF;Supervision/Assistance - 24 hour     Equipment Recommendations  (SNF)       Precautions / Restrictions Precautions Precautions: Fall Restrictions Weight Bearing Restrictions: No    Mobility  Bed Mobility Overal bed mobility: Needs Assistance Bed Mobility: Supine to Sit     Supine to sit: Mod assist        Transfers Overall transfer level: Needs assistance Equipment used: 2 person hand held assist Transfers: Sit to/from Stand Sit to Stand: Min assist;+2 safety/equipment(3 reps total)         General transfer comment: to ascend, for balance and safety; multimodal cueing with poor initation of task  Ambulation/Gait Ambulation/Gait assistance: Mod assist;+2 physical assistance Gait Distance (Feet): 150 Feet Assistive device: 2 person hand held assist;Rolling walker (2 wheeled) Gait Pattern/deviations: Step-through pattern;Decreased stride length;Trunk flexed;Shuffle Gait velocity: slow Gait velocity interpretation: <1.31 ft/sec, indicative of household ambulator General Gait Details: pt slow requiring max tactile cues to cont forward movement as she would  stop at times, started out with 2 person HHA then trialed RW, she was able to use RW but still mod A for RW management and balance at times          Balance Overall balance assessment: Needs assistance Sitting-balance support: No upper extremity supported;Feet supported Sitting balance-Leahy Scale: Fair     Standing balance support: Bilateral upper extremity supported;During functional activity Standing balance-Leahy Scale: Poor Standing balance comment: relaint on BUE and external support        Cognition Arousal/Alertness: Lethargic Behavior During Therapy: Flat affect Overall Cognitive Status: Impaired/Different from baseline Area of Impairment: Orientation;Attention;Memory;Following commands;Safety/judgement;Awareness;Problem solving                 Orientation Level: Disoriented to;Place;Time;Situation Current Attention Level: Focused Memory: Decreased recall of precautions;Decreased short-term memory Following Commands: Follows one step commands inconsistently;Follows one step commands with increased time Safety/Judgement: Decreased awareness of safety;Decreased awareness of deficits Awareness: Intellectual Problem Solving: Slow processing;Decreased initiation;Difficulty sequencing;Requires verbal cues;Requires tactile cues General Comments: pt oriented to self only, verbalizes "I don't know", requires increased time to process and follow commands (inconsistently), poor problem solving and awareness-- continue assessment, possible concusion?         PT Goals (current goals can now be found in the care plan section) Acute Rehab PT Goals Patient Stated Goal: none stated PT Goal Formulation: Patient unable to participate in goal setting Time For Goal Achievement: 11/09/18 Potential to Achieve Goals: Good    Frequency    Min 3X/week       AM-PAC PT "6 Clicks" Mobility   Outcome Measure  Help needed turning from your back to your side  while in a flat  bed without using bedrails?: A Lot Help needed moving from lying on your back to sitting on the side of a flat bed without using bedrails?: A Lot Help needed moving to and from a bed to a chair (including a wheelchair)?: A Lot Help needed standing up from a chair using your arms (e.g., wheelchair or bedside chair)?: A Lot Help needed to walk in hospital room?: A Lot Help needed climbing 3-5 steps with a railing? : Total 6 Click Score: 11    End of Session Equipment Utilized During Treatment: Gait belt Activity Tolerance: Patient tolerated treatment well Patient left: with call bell/phone within reach;in bed;with bed alarm set Nurse Communication: Mobility status PT Visit Diagnosis: Unsteadiness on feet (R26.81);Muscle weakness (generalized) (M62.81);Difficulty in walking, not elsewhere classified (R26.2)     Time: 5462-7035 PT Time Calculation (min) (ACUTE ONLY): 25 min  Charges:  $Therapeutic Activity: 23-37 mins                     Ivery Quale, PT,DPT Acute Rehabilitation Services Office [phone]    April Manson 10/27/2018, 2:52 PM

## 2018-10-27 NOTE — Progress Notes (Signed)
STROKE TEAM PROGRESS NOTE   INTERVAL HISTORY Patient remains confused and disoriented.  No other changes.  Follow-up CT scan of the head shows improvement in subarachnoid blood but stable parenchymal hematoma Vitals:   10/26/18 2333 10/27/18 0325 10/27/18 0812 10/27/18 1218  BP: (!) 166/95 (!) 166/89 132/74 (!) 142/74  Pulse: 85 71 77 62  Resp: 17 16 17 16   Temp: 98.5 F (36.9 C) 98.3 F (36.8 C) 98.3 F (36.8 C) 98.4 F (36.9 C)  TempSrc: Oral Oral Oral Oral  SpO2: 99% 98% 98% 97%  Weight:      Height:        CBC:  Recent Labs  Lab 10/24/18 1545  10/26/18 0440 10/27/18 0351  WBC 6.7   < > 5.8 10.0  NEUTROABS 5.0  --   --   --   HGB 14.2   < > 13.2 13.7  HCT 39.3   < > 37.4 37.8  MCV 86.4   < > 87.8 86.9  PLT 264   < > 239 259   < > = values in this interval not displayed.    Basic Metabolic Panel:  Recent Labs  Lab 10/26/18 0440 10/27/18 0351  NA 130* 129*  K 2.9* 3.6  CL 90* 95*  CO2 22 21*  GLUCOSE 78 105*  BUN 9 5*  CREATININE 0.65 0.69  CALCIUM 9.0 8.8*  MG 1.6* 1.9   Lipid Panel:     Component Value Date/Time   CHOL 135 10/26/2018 0440   TRIG 39 10/26/2018 0440   HDL 55 10/26/2018 0440   CHOLHDL 2.5 10/26/2018 0440   VLDL 8 10/26/2018 0440   LDLCALC 72 10/26/2018 0440   HgbA1c:  Lab Results  Component Value Date   HGBA1C 5.0 10/25/2018    PHYSICAL EXAM : Frail elderly Caucasian lady not in distress. Left frontal abrasion and left cheek bruising. . Afebrile. Head is nontraumatic. Neck is supple without bruit.    Cardiac exam no murmur or gallop. Lungs are clear to auscultation. Distal pulses are well felt. Neurological Exam :  Patient is awake alert disoriented x3.  She has diminished attention, registration and recall.  She is unable to tell me where she is a month, year or name president.  Follows simple midline and one-step commands.  Blinks to threat bilaterally.  Extraocular movements are full range without nystagmus.  She has decreased  hearing bilaterally.  Her speech is clear without dysarthria or aphasia.  She is able to move all 4 extremities against gravity without focal weakness but is unable to cooperate for detailed muscle testing.  ASSESSMENT/PLAN Ms. Bartholomew BoardsLoretta J Hightower is a 78 y.o. female with history of HTN found in the [employer] parking lot after sustaining an unwitnessed fall.  Found to have traumatic bilateral convexity SAH.  Confused on presentation.  Imaging shows questionable right temporal hypodensity-etiology indeterminate  ?  Remote trauma, injury or stroke  Traumatic SAH following fall ? Etiology syncope from AFIB Hypodensity right temporal lobe of unclear etiology, unlikely to be acute stroke or seizure Acute Metabolic Encephalopathy  CT head, Maxillofacial  nonspecific hypodensity right inferior temporal gyrus.  Small posttraumatic SAH over convexities.  Left periorbital superficial scalp hematoma.  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  Trace subarachnoid hemorrhage over both convexities consistent with trauma.  Large area of subcortical hyperintense T2 signal in right temporal lobe with associated leptomeningeal contrast enhancement (?  Late subacute or early chronic infarct).  Advanced atrophy.  Severe small vessel disease.  Post contrast imaging severely motion degraded.  CTA head & neck no significant stenosis intra-or extracranial.  Resolving intracranial SAH.  Increasing prominence of delayed intracerebral hematoma left frontal cortex with mild surrounding edema.  Subacute right temporal infarct.  2D Echo w/ bubble EF 60 to 65%.  Patient in A. fib.  Bubble study negative.  Repeat CT head unchanged hematoma in L frontal lobe. Near resolution SAH.  EEG negative   LDL 72   HgbA1c 5.0  TSH & VB 12 normal  SCDs for VTE prophylaxis  No antithrombotic prior to admission, now on No antithrombotic given traumatic SAH.  if hemorrhage resolved on CT in a.m., ok to add  Aspirin 81 mg  for AF at time of discharge to SNF.She is not a good candidate for longterm anticoagulation due to dementia  Therapy recommendations: SNF  Disposition: Pending  Atrial Fibrillation, new onset  Home anticoagulation:  none   CHA2DS2-VASc Score = 4, ?2 oral anticoagulation recommended  Age in Years:  ?36   +2    Sex:  Female   Female   +1    Hypertension History:  yes   +1     Diabetes Mellitus:  0  Congestive Heart Failure History:  0  Vascular Disease History:  0     Stroke/TIA/Thromboembolism History:  0 .  if hemorrhage resolved on CT in a.m., ok to add [facility] for AF   Hypertension  Stable . Recommend SBP goal < 180 . Long-term BP goal normotensive  Hyperlipidemia  Home meds: Zocor 20, resumed in hospital  LDL 72, goal < 70  Continue statin at discharge  Other Stroke Risk Factors  Advanced age  Other Active Problems  Hyponatremia  Hypokalemia  Hospital day # 3  .  Patient has A. fib and has had traumatic contusion and subarachnoid hemorrhage.  Given her baseline dementia and further cognitive worsening due to traumatic contusion I do not think patient is a good long-term anticoagulation candidate.  Recommend start aspirin 81 mg at the time of discharge to skilled nursing facility.  Stroke team will sign off.  Discussed with Dr. Karie Chimera, MD Medical Director [facility] Pager: [phone] 10/27/2018 1:39 PM   To contact Stroke Continuity provider, please refer to [email or web address]. After hours, contact General Neurology

## 2018-10-27 NOTE — NC FL2 (Signed)
[place] MEDICAID FL2 LEVEL OF CARE SCREENING TOOL     IDENTIFICATION  Patient Name: Bartholomew BoardsLoretta J Trotter Birthdate: 11/20/1940 Sex: female Admission Date (Current Location): 10/24/2018  [facility] [place]   Facility and Address:  [facility] [place], [address], [place], [place] [address]      Provider Number: [id number]  Attending Physician Name and Address:  Dimple NanasAmin, Ankit Chirag, MD  Relative Name and Phone Number:       Current Level of Care: Hospital Recommended Level of Care: Skilled Nursing Facility Prior Approval Number:    Date Approved/Denied:   PASRR Number: [id number]  Discharge Plan: SNF    Current Diagnoses: Patient Active Problem List   Diagnosis Date Noted  . SAH (subarachnoid hemorrhage) (HCC) 10/24/2018  . Fall at home, initial encounter 10/24/2018  . HTN (hypertension) 10/24/2018  . New onset a-fib (HCC) 10/24/2018  . CVA (cerebral vascular accident) (HCC) 10/24/2018  . Hyponatremia 10/24/2018  . Hypokalemia 10/24/2018    Orientation RESPIRATION BLADDER Height & Weight     Self  Normal Incontinent Weight: 130 lb (59 kg) Height:  6' (182.9 cm)  BEHAVIORAL SYMPTOMS/MOOD NEUROLOGICAL BOWEL NUTRITION STATUS      Incontinent Diet(see DC summary)  AMBULATORY STATUS COMMUNICATION OF NEEDS Skin   Extensive Assist Verbally Skin abrasions                       Personal Care Assistance Level of Assistance  Bathing, Feeding, Dressing Bathing Assistance: Limited assistance Feeding assistance: Limited assistance Dressing Assistance: Limited assistance     Functional Limitations Info  Sight, Hearing, Speech Sight Info: Adequate Hearing Info: Adequate Speech Info: Adequate    SPECIAL CARE FACTORS FREQUENCY  PT (By licensed PT), OT (By licensed OT)     PT Frequency: 5x/wk OT Frequency: 5x/wk            Contractures Contractures Info: Not present    Additional Factors Info  Code Status,  Allergies Code Status Info: Full Allergies Info: NKA           Current Medications (10/27/2018):  This is the current hospital active medication list Current Facility-Administered Medications  Medication Dose Route Frequency Provider Last Rate Last Dose  . 0.9 %  sodium chloride infusion   Intravenous Continuous Rometta EmeryGarba, Mohammad L, MD 75 mL/hr at 10/24/18 2029    . atenolol (TENORMIN) tablet 100 mg  100 mg Oral Daily Earlie LouGarba, Mohammad L, MD   100 mg at 10/27/18 1047  . lisinopril (ZESTRIL) tablet 20 mg  20 mg Oral Daily Earlie LouGarba, Mohammad L, MD   20 mg at 10/27/18 1047  . loperamide (IMODIUM) capsule 2 mg  2 mg Oral QID PRN Rometta EmeryGarba, Mohammad L, MD      . multivitamin (PROSIGHT) tablet 1 tablet  1 tablet Oral Daily Rometta EmeryGarba, Mohammad L, MD   1 tablet at 10/27/18 1047  . ondansetron (ZOFRAN) tablet 4 mg  4 mg Oral Q6H PRN Rometta EmeryGarba, Mohammad L, MD       Or  . ondansetron (ZOFRAN) injection 4 mg  4 mg Intravenous Q6H PRN Mikeal HawthorneGarba, Mohammad L, MD      . pantoprazole (PROTONIX) EC tablet 40 mg  40 mg Oral Daily Earlie LouGarba, Mohammad L, MD   40 mg at 10/27/18 1047  . simvastatin (ZOCOR) tablet 20 mg  20 mg Oral QPM Rometta EmeryGarba, Mohammad L, MD   20 mg at 10/26/18 1708     Discharge Medications: Please  see discharge summary for a list of discharge medications.  Relevant Imaging Results:  Relevant Lab Results:   Additional Information SS#: [id number]  Baldemar Lenis, LCSW

## 2018-10-28 LAB — BASIC METABOLIC PANEL
Anion gap: 11 (ref 5–15)
BUN: 7 mg/dL — ABNORMAL LOW (ref 8–23)
CO2: 25 mmol/L (ref 22–32)
Calcium: 9 mg/dL (ref 8.9–10.3)
Chloride: 93 mmol/L — ABNORMAL LOW (ref 98–111)
Creatinine, Ser: 0.56 mg/dL (ref 0.44–1.00)
GFR calc Af Amer: >60 mL/min (ref >60)
GFR calc non Af Amer: >60 mL/min (ref >60)
Glucose, Bld: 105 mg/dL — ABNORMAL HIGH (ref 70–99)
Potassium: 3 mmol/L — ABNORMAL LOW (ref 3.5–5.1)
Sodium: 129 mmol/L — ABNORMAL LOW (ref 135–145)

## 2018-10-28 LAB — CBC
HCT: 34 % — ABNORMAL LOW (ref 36.0–46.0)
Hemoglobin: 12.3 g/dL (ref 12.0–15.0)
MCH: 31.2 pg (ref 26.0–34.0)
MCHC: 36.2 g/dL — ABNORMAL HIGH (ref 30.0–36.0)
MCV: 86.3 fL (ref 80.0–100.0)
Platelets: 256 10*3/uL (ref 150–400)
RBC: 3.94 MIL/uL (ref 3.87–5.11)
RDW: 12.3 % (ref 11.5–15.5)
WBC: 5.8 10*3/uL (ref 4.0–10.5)
nRBC: 0 % (ref 0.0–0.2)

## 2018-10-28 LAB — GLUCOSE, CAPILLARY
Glucose-Capillary: 107 mg/dL — ABNORMAL HIGH (ref 70–99)
Glucose-Capillary: 95 mg/dL (ref 70–99)

## 2018-10-28 LAB — MAGNESIUM: Magnesium: 1.6 mg/dL — ABNORMAL LOW (ref 1.7–2.4)

## 2018-10-28 MED ORDER — POTASSIUM CHLORIDE CRYS ER 20 MEQ PO TBCR
40.0000 meq | EXTENDED_RELEASE_TABLET | Freq: Once | ORAL | Status: AC
  Administered 2018-10-28: 40 meq via ORAL
  Filled 2018-10-28: qty 2

## 2018-10-28 MED ORDER — MAGNESIUM SULFATE 2 GM/50ML IV SOLN
2.0000 g | Freq: Once | INTRAVENOUS | Status: AC
  Administered 2018-10-28: 2 g via INTRAVENOUS
  Filled 2018-10-28: qty 50

## 2018-10-28 NOTE — Progress Notes (Signed)
Occupational Therapy Treatment Patient Details Name: Tammy Navarro MRN: [id number] [id number] DOB: 07/17/1940 Today's Date: 10/28/2018    History of present illness Pt is a 78 y/o female found after falling at [employer] [address], unwittnessed fall with no recall of the event.  Imaging reveals small subarachnoid hemorrhage. PMH: HTN, a fib.    OT comments  Pt presents supine in bed agreeable to OT treatment session. Pt incontinent of bowel/bladder during this session; requiring totalA for peri-care in standing. Pt performing x2 sit<>stand and able to maintain static standing at RW during ADL task with overall minA for mobility. Pt completing LB dressing task (doffing socks) with minA and seated grooming ADL with setup/minguard assist. Overall following one step commands throughout given min cues for initiation. Feel POC remains appropriate at this time. Will continue to follow acutely.   Follow Up Recommendations  SNF;Supervision/Assistance - 24 hour    Equipment Recommendations  Other (comment)(TBA)          Precautions / Restrictions Precautions Precautions: Fall Precaution Comments: incontinence  Restrictions Weight Bearing Restrictions: No       Mobility Bed Mobility Overal bed mobility: Needs Assistance Bed Mobility: Supine to Sit;Sit to Supine     Supine to sit: Min assist Sit to supine: Min guard   General bed mobility comments: verbal cues for initiation with minA for trunk ; cues to scoot hips forward  Transfers Overall transfer level: Needs assistance Equipment used: Rolling walker (2 wheeled) Transfers: Sit to/from Stand Sit to Stand: Min assist;Min guard         General transfer comment: to ascend, for balance and safety; performed x2 during session and able to maintain static balance with minguard-minA while therapist assists with peri-care    Balance Overall balance assessment: Needs assistance Sitting-balance support: No upper extremity supported;Feet  supported Sitting balance-Leahy Scale: Fair     Standing balance support: Bilateral upper extremity supported;During functional activity Standing balance-Leahy Scale: Poor Standing balance comment: relaint on BUE and external support                           ADL either performed or assessed with clinical judgement   ADL Overall ADL's : Needs assistance/impaired     Grooming: Wash/dry face;Set up;Min guard;Sitting           Upper Body Dressing : Minimal assistance;Sitting Upper Body Dressing Details (indicate cue type and reason): doffing/donning new gown  Lower Body Dressing: Minimal assistance;Sit to/from stand Lower Body Dressing Details (indicate cue type and reason): pt was able to doff her socks seated EOB as they were soiled, minA for standing balance     Toileting- Clothing Manipulation and Hygiene: Maximal assistance;Total assistance;Sit to/from stand Toileting - Clothing Manipulation Details (indicate cue type and reason): pt incontinent of bowel/bladder at start of and during session; requires totalA for peri-care with minA provided for standing balance             Vision       Perception     Praxis      Cognition Arousal/Alertness: Lethargic Behavior During Therapy: Flat affect;Restless Overall Cognitive Status: Impaired/Different from baseline Area of Impairment: Orientation;Attention;Memory;Following commands;Safety/judgement;Awareness;Problem solving                 Orientation Level: Disoriented to;Time;Situation(able to state hospital given multiple choice) Current Attention Level: Focused Memory: Decreased recall of precautions;Decreased short-term memory Following Commands: Follows one step commands inconsistently Safety/Judgement: Decreased awareness of safety;Decreased  awareness of deficits Awareness: Intellectual Problem Solving: Slow processing;Decreased initiation;Difficulty sequencing;Requires verbal cues;Requires  tactile cues          Exercises     Shoulder Instructions       General Comments      Pertinent Vitals/ Pain       Pain Assessment: No/denies pain  Home Living                                          Prior Functioning/Environment              Frequency  Min 2X/week        Progress Toward Goals  OT Goals(current goals can now be found in the care plan section)  Progress towards OT goals: Progressing toward goals  Acute Rehab OT Goals Patient Stated Goal: none stated OT Goal Formulation: Patient unable to participate in goal setting Time For Goal Achievement: 11/09/18 Potential to Achieve Goals: Good  Plan Discharge plan remains appropriate    Co-evaluation                 AM-PAC OT "6 Clicks" Daily Activity     Outcome Measure   Help from another person eating meals?: A Little Help from another person taking care of personal grooming?: A Little Help from another person toileting, which includes using toliet, bedpan, or urinal?: Total Help from another person bathing (including washing, rinsing, drying)?: A Lot Help from another person to put on and taking off regular upper body clothing?: A Little Help from another person to put on and taking off regular lower body clothing?: A Lot 6 Click Score: 14    End of Session Equipment Utilized During Treatment: Gait belt;Rolling walker  OT Visit Diagnosis: Other abnormalities of gait and mobility (R26.89);Muscle weakness (generalized) (M62.81);Other symptoms and signs involving cognitive function   Activity Tolerance Patient tolerated treatment well   Patient Left in bed;with call bell/phone within reach;with bed alarm set;with nursing/sitter in room(telesitter)   Nurse Communication Mobility status        Time: 6270-3500 OT Time Calculation (min): 37 min  Charges: OT General Charges $OT Visit: 1 Visit OT Treatments $Self Care/Home Management : 23-37 mins  Marcy Siren, OT Supplemental Rehabilitation Services Pager [phone] Office [phone]   Orlando Penner 10/28/2018, 3:53 PM

## 2018-10-28 NOTE — Progress Notes (Signed)
Physical Therapy Treatment Patient Details Name: Tammy BoardsLoretta J Lapp MRN: [id number] DOB: 03/14/1941 Today's Date: 10/28/2018    History of Present Illness Pt is a 78 y/o female found after falling at [employer] [address], unwittnessed fall with no recall of the event.  Imaging reveals small subarachnoid hemorrhage. PMH: HTN, a fib.     PT Comments    Pt was again restless and ready to get out of bed. Upon standing with RW and mod A it was discovered she had bowel and bladder movement. She then worked on standing tolerance for 4 min while PT tech assisted with hygiene. After this she was not able to ambulate as far as she was fatigued from standing. Due to her fall risk and current cognitive SNF would be most appropriate for her.  Follow Up Recommendations  SNF     Equipment Recommendations  (TBD but likely SNF)    Recommendations for Other Services       Precautions / Restrictions Precautions Precautions: Fall Restrictions Weight Bearing Restrictions: No    Mobility  Bed Mobility Overal bed mobility: Needs Assistance Bed Mobility: Supine to Sit     Supine to sit: Mod assist        Transfers Overall transfer level: Needs assistance Equipment used: Rolling walker (2 wheeled) Transfers: Sit to/from Stand Sit to Stand: Min assist;+2 safety/equipment(3 reps total)         General transfer comment: to ascend, for balance and safety; performed 4 reps total including standing for 3-4 minutes with PT while tech assisted with hygenie as she was incontinent with bowel and bladder movement.  Ambulation/Gait Ambulation/Gait assistance: Mod assist;+2 safety/equipment Gait Distance (Feet): 20 Feet(reduced today after she had stood for 4 minutes prior ) Assistive device: Rolling walker (2 wheeled) Gait Pattern/deviations: Step-through pattern;Decreased stride length;Trunk flexed;Shuffle Gait velocity: slow   General Gait Details: pt slow requiring max tactile cues to cont  forward movement as she would stop at times, she was able to use RW but still mod A for RW management and balance at times        Balance Overall balance assessment: Needs assistance Sitting-balance support: No upper extremity supported;Feet supported Sitting balance-Leahy Scale: Fair     Standing balance support: Bilateral upper extremity supported;During functional activity Standing balance-Leahy Scale: Poor Standing balance comment: relaint on BUE and external support                            Cognition Arousal/Alertness: Lethargic Behavior During Therapy: Flat affect;Restless Overall Cognitive Status: Impaired/Different from baseline                                               Pertinent Vitals/Pain Pain Assessment: No/denies pain           PT Goals (current goals can now be found in the care plan section) Acute Rehab PT Goals Patient Stated Goal: none stated PT Goal Formulation: Patient unable to participate in goal setting Time For Goal Achievement: 11/09/18 Potential to Achieve Goals: Good    Frequency    Min 3X/week      PT Plan Current plan remains appropriate       AM-PAC PT "6 Clicks" Mobility   Outcome Measure  Help needed turning from your back to your side while in a flat bed without  using bedrails?: A Lot Help needed moving from lying on your back to sitting on the side of a flat bed without using bedrails?: A Lot Help needed moving to and from a bed to a chair (including a wheelchair)?: A Lot Help needed standing up from a chair using your arms (e.g., wheelchair or bedside chair)?: A Lot Help needed to walk in hospital room?: A Lot Help needed climbing 3-5 steps with a railing? : Total 6 Click Score: 11    End of Session Equipment Utilized During Treatment: Gait belt Activity Tolerance: Patient tolerated treatment well Patient left: with call bell/phone within reach;in bed;with bed alarm set Nurse  Communication: Mobility status PT Visit Diagnosis: Unsteadiness on feet (R26.81);Muscle weakness (generalized) (M62.81);Difficulty in walking, not elsewhere classified (R26.2)     Time: 7209-4709 PT Time Calculation (min) (ACUTE ONLY): 24 min  Charges:  $Therapeutic Activity: 23-37 mins                     Ivery Quale, PT,DPT Acute Rehabilitation Services Office [phone]    April Manson 10/28/2018, 1:19 PM

## 2018-10-28 NOTE — Evaluation (Signed)
Clinical/Bedside Swallow Evaluation Patient Details  Name: Tammy Navarro MRN: [id number] Date of Birth: 1940-11-04  Today's Date: 10/28/2018 Time: SLP Start Time (ACUTE ONLY): 0910 SLP Stop Time (ACUTE ONLY): 0935 SLP Time Calculation (min) (ACUTE ONLY): 25 min  Past Medical History:  Past Medical History:  Diagnosis Date  . Hypertension    Past Surgical History: History reviewed. No pertinent surgical history. HPI:  Pt is a 78 y/o female found after falling at [employer] lot, unwittnessed fall with no recall of the event.  Imaging reveals small subarachnoid hemorrhage. PMH: HTN, a fib.  BSE swallow eval ordered secondary to reports of patient coughing when eating and drinking.    Assessment / Plan / Recommendation Clinical Impression  Patient presents with an oropharyngeal swallow that is [facility] without any overt s/s of aspiration or pentration and with timely swallow initiation and adequate laryngeal elevation and pharyngeal contraction per palpation. Patient did exhibit mild delay in mastication, however she continues to be confused and all her actions, speech, and PO intake are slightly delayed.  SLP Visit Diagnosis: Dysphagia, unspecified (R13.10)    Aspiration Risk  No limitations    Diet Recommendation Thin liquid;Regular   Liquid Administration via: Straw;Cup Medication Administration: Whole meds with liquid Supervision: Patient able to self feed Compensations: Minimize environmental distractions;Slow rate;Small sips/bites Postural Changes: Seated upright at 90 degrees    Other  Recommendations Oral Care Recommendations: Oral care BID   Follow up Recommendations None      Frequency and Duration   N/A          Prognosis   N/A    Swallow Study   General Date of Onset: 10/24/18 HPI: Pt is a 78 y/o female found after falling at [employer] lot, unwittnessed fall with no recall of the event.  Imaging reveals small subarachnoid hemorrhage. PMH: HTN, a  fib.  BSE swallow eval ordered secondary to reports of patient coughing when eating and drinking.  Type of Study: Bedside Swallow Evaluation Previous Swallow Assessment: N/A Diet Prior to this Study: Regular;Thin liquids Temperature Spikes Noted: No Respiratory Status: Room air History of Recent Intubation: No Behavior/Cognition: Alert;Cooperative;Pleasant mood;Confused;Distractible Oral Cavity Assessment: Within Functional Limits Oral Care Completed by SLP: Yes Oral Cavity - Dentition: Dentures, top;Dentures, bottom Vision: Functional for self-feeding Self-Feeding Abilities: Able to feed self Patient Positioning: Upright in bed Baseline Vocal Quality: Normal Volitional Cough: Weak Volitional Swallow: Able to elicit    Oral/Motor/Sensory Function Overall Oral Motor/Sensory Function: Within functional limits   Ice Chips     Thin Liquid Thin Liquid: Within functional limits Presentation: Straw Other Comments: no overt s/s of aspiration or penetration observed    Nectar Thick Nectar Thick Liquid: Not tested   Honey Thick Honey Thick Liquid: Not tested   Puree Puree: Not tested   Solid     Solid: Impaired Oral Phase Impairments: Other (comment)(slow mastication) Other Comments: patient was slow to masticate hard solid (graham cracker) but she is also slow in responding and actions in general at this time.      Elio Forget Tarrell 10/28/2018,12:09 PM   Angela Nevin, MA, CCC-SLP Speech Therapy [facility] Acute Rehab Pager: [phone]

## 2018-10-28 NOTE — TOC Progression Note (Signed)
Transition of Care [facility]) - Progression Note    Patient Details  Name: Tammy Navarro MRN: [id number] Date of Birth: 12/24/1940  [facility] CM/SW Contact  Tammy Navarro, [place] Phone Number: 10/28/2018, 3:07 PM  Clinical Narrative:  CSW received return call from [facility] to discuss SNF placement. Tammy Navarro says his wife is a Engineer, civil (consulting), the family's first choice would be [facility]. CSW confirmed bed availability at [facility]. CSW contacted Tammy Navarro to update him. CSW to transition to [facility] when medically stable.     Expected Discharge Plan: Skilled Nursing Facility Barriers to Discharge: Continued Medical Work up, English as a second language teacher  Expected Discharge Plan and Services Expected Discharge Plan: Skilled Nursing Facility     Post Acute Care Choice: Skilled Nursing Facility Living arrangements for the past 2 months: Single Family Home Expected Discharge Date: 10/27/18                                     Social Determinants of Health (SDOH) Interventions    Readmission Risk Interventions No flowsheet data found.

## 2018-10-28 NOTE — Progress Notes (Signed)
PROGRESS NOTE    Tammy Navarro  ZOX:[id number] DOB: 1940-12-01 DOA: 10/24/2018 PCP: Gaspar Garbe, MD   Brief Narrative: Tammy Navarro is a 78 y.o. female with a history of hypertension. She presented after a fall and found to have a SAH and SDH. She had associated metabolic encephalopathy which is improving.   Assessment & Plan:   Principal Problem:   SAH (subarachnoid hemorrhage) (HCC) Active Problems:   Fall at home, initial encounter   HTN (hypertension)   New onset a-fib (HCC)   CVA (cerebral vascular accident) (HCC)   Hyponatremia   Hypokalemia   Subarachnoid hemorrhage Subdural hemorrhage Traumatic and secondary to fall. Subarachnoid is resolving and frontal lobe subdural hemorrhage is stable on repeat CT scan. Mental status is stable. Hemoglobin stable. Neurology consulted and have recommendations to start aspirin on discharge; currently signed off.  Atrial fibrillation New onset. CHA2DS2-VASc Score is 4. however, high risk of fall and in setting of dementia and falls risks, neurology recommends at most aspirin and no anticoagulation. -Continue atenolol  Acute metabolic encephalopathy Per chart review, patient seems to be baseline today. EEG negative for seizure activity. Subacute stroke seen on imagine. Conversant with me.  Hyperlipidemia -Continue simvastatin  Essential hypertension -Continue atenolol and lisinopril -hydrochlorothiazide discontinued  Hyponatremia Thought secondary to dehydration and hydrochlorothiazide. Urine studies ordered and pending. AM cortisol checked on 5/13 and is normal. -Continue IV fluids for now -Follow-up urine sodium/osmolality -Repeat BMP this afternoon  Hyperkalemia Hypomagnesemia Replete.   DVT prophylaxis: SCDs Code Status:   Code Status: Full Code Family Communication: None Disposition Plan: Discharge to SNF when telesitter discontinued x24 hours and improvement of electrolyte abnormalities    Consultants:   Neurology  Procedures:   None  Antimicrobials:  None    Subjective: No concerns today per patient.  Objective: Vitals:   10/28/18 0443 10/28/18 0745 10/28/18 1014 10/28/18 1149  BP: 136/84 (!) 154/95 130/79 138/77  Pulse: 66 89 70 64  Resp: Temp: 97.7 F (36.5 C) 97.8 F (36.6 C)  98.1 F (36.7 C)  TempSrc: Oral Oral  Oral  SpO2: 100% 98%  96%  Weight:      Height:        Intake/Output Summary (Last 24 hours) at 10/28/2018 1433 Last data filed at 10/28/2018 0853 Gross per 24 hour  Intake 3258.04 ml  Output -  Net 3258.04 ml   Filed Weights   10/24/18 1509  Weight: 59 kg    Examination:  General exam: Appears calm and comfortable Respiratory system: Clear to auscultation. Respiratory effort normal. Cardiovascular system: S1 & S2 heard, RRR. No murmurs, rubs, gallops or clicks. Gastrointestinal system: Abdomen is nondistended, soft and nontender. No organomegaly or masses felt. Normal bowel sounds heard. Central nervous system: Alert and oriented to person, place. 5/5 UE strength Extremities: No edema. No calf tenderness Skin: No cyanosis. Some bruising on face Psychiatry: Mood & affect appropriate.     Data Reviewed: I have personally reviewed following labs and imaging studies  CBC: Recent Labs  Lab 10/24/18 1545 10/25/18 0331 10/26/18 0440 10/27/18 0351 10/28/18 0343  WBC 6.7 7.7 5.8 10.0 5.8  NEUTROABS 5.0  --   --   --   --   HGB 14.2 13.0 13.2 13.7 12.3  HCT 39.3 35.8* 37.4 37.8 34.0*  MCV 86.4 87.3 87.8 86.9 86.3  PLT 264 232 239 259 256   Basic Metabolic Panel: Recent Labs  Lab 10/24/18 1545 10/25/18 0331  10/25/18 1525 10/26/18 0440 10/27/18 0351 10/28/18 0343  NA 124* 130*  --  130* 129* 129*  K 2.8* 2.7* 3.2* 2.9* 3.6 3.0*  CL 81* 92*  --  90* 95* 93*  CO2 25 25  --  22 21* 25  GLUCOSE 97 80  --  78 105* 105*  BUN 15 11  --  9 5* 7*  CREATININE 0.64 0.62  --  0.65 0.69 0.56  CALCIUM 9.1 8.8*  --   9.0 8.8* 9.0  MG  --   --  1.7 1.6* 1.9 1.6*   GFR: Estimated Creatinine Clearance: 53.1 mL/min (by C-G formula based on SCr of 0.56 mg/dL). Liver Function Tests: Recent Labs  Lab 10/24/18 1545 10/25/18 0331  AST 29 26  ALT 16 14  ALKPHOS 70 57  BILITOT 1.1 1.3*  PROT 6.8 6.0*  ALBUMIN 3.7 3.1*   No results for input(s): LIPASE, AMYLASE in the last 168 hours. No results for input(s): AMMONIA in the last 168 hours. Coagulation Profile: No results for input(s): INR, PROTIME in the last 168 hours. Cardiac Enzymes: Recent Labs  Lab 10/24/18 1545  TROPONINI <0.03   BNP (last 3 results) No results for input(s): PROBNP in the last 8760 hours. HbA1C: Recent Labs    10/25/18 1540  HGBA1C 5.0   CBG: Recent Labs  Lab 10/27/18 1644 10/27/18 2200 10/28/18 0039 10/28/18 0441  GLUCAP 131* 114* 107* 95   Lipid Profile: Recent Labs    10/26/18 0440  CHOL 135  HDL 55  LDLCALC 72  TRIG 39  CHOLHDL 2.5   Thyroid Function Tests: Recent Labs    10/26/18 1130  TSH 2.222   Anemia Panel: Recent Labs    10/26/18 1130  VITAMINB12 514   Sepsis Labs: No results for input(s): PROCALCITON, LATICACIDVEN in the last 168 hours.  Recent Results (from the past 240 hour(s))  SARS Coronavirus 2 (CEPHEID - Performed in [facility] lab), Hosp Order     Status: None   Collection Time: 10/24/18  7:17 PM  Result Value Ref Range Status   SARS Coronavirus 2 NEGATIVE NEGATIVE Final    Comment: (NOTE) If result is NEGATIVE SARS-CoV-2 target nucleic acids are NOT DETECTED. The SARS-CoV-2 RNA is generally detectable in upper and lower  respiratory specimens during the acute phase of infection. The lowest  concentration of SARS-CoV-2 viral copies this assay can detect is 250  copies / mL. A negative result does not preclude SARS-CoV-2 infection  and should not be used as the sole basis for treatment or other  patient management decisions.  A negative result may occur with   improper specimen collection / handling, submission of specimen other  than nasopharyngeal swab, presence of viral mutation(s) within the  areas targeted by this assay, and inadequate number of viral copies  (<250 copies / mL). A negative result must be combined with clinical  observations, patient history, and epidemiological information. If result is POSITIVE SARS-CoV-2 target nucleic acids are DETECTED. The SARS-CoV-2 RNA is generally detectable in upper and lower  respiratory specimens dur ing the acute phase of infection.  Positive  results are indicative of active infection with SARS-CoV-2.  Clinical  correlation with patient history and other diagnostic information is  necessary to determine patient infection status.  Positive results do  not rule out bacterial infection or co-infection with other viruses. If result is PRESUMPTIVE POSTIVE SARS-CoV-2 nucleic acids MAY BE PRESENT.   A presumptive positive result was obtained on  the submitted specimen  and confirmed on repeat testing.  While 2019 novel coronavirus  (SARS-CoV-2) nucleic acids may be present in the submitted sample  additional confirmatory testing may be necessary for epidemiological  and / or clinical management purposes  to differentiate between  SARS-CoV-2 and other Sarbecovirus currently known to infect humans.  If clinically indicated additional testing with an alternate test  methodology [phone]) is advised. The SARS-CoV-2 RNA is generally  detectable in upper and lower respiratory sp ecimens during the acute  phase of infection. The expected result is Negative. Fact Sheet for Patients:  BoilerBrush.com.cy Fact Sheet for Healthcare Providers: https://pope.com/ This test is not yet approved or cleared by the [place] FDA and has been authorized for detection and/or diagnosis of SARS-CoV-2 by FDA under an Emergency Use Authorization (EUA).  This EUA will  remain in effect (meaning this test can be used) for the duration of the COVID-19 declaration under Section 564(b)(1) of the Act, 21 U.S.C. section 360bbb-3(b)(1), unless the authorization is terminated or revoked sooner. Performed at [facility], [address], [place], [place] [address]          Radiology Studies: Ct Head Wo Contrast  Result Date: 10/27/2018 CLINICAL DATA:  Subarachnoid hemorrhage follow up EXAM: CT HEAD WITHOUT CONTRAST TECHNIQUE: Contiguous axial images were obtained from the base of the skull through the vertex without intravenous contrast. COMPARISON:  Head CT 10/26/2018 FINDINGS: Brain: Small area of parenchymal hematoma at the left frontal cortex is unchanged, measuring 10 x 8 mm. Trace subarachnoid blood is present over the right convexity. No new site of hemorrhage. No midline shift or mass effect. Unchanged appearance of subacute ischemia in the right temporal lobe. Vascular: No abnormal hyperdensity of the major intracranial arteries or dural venous sinuses. No intracranial atherosclerosis. Skull: The visualized skull base, calvarium and extracranial soft tissues are normal. Sinuses/Orbits: No fluid levels or advanced mucosal thickening of the visualized paranasal sinuses. No mastoid or middle ear effusion. The orbits are normal. IMPRESSION: 1. Unchanged intraparenchymal hematoma in the left frontal lobe without mass effect. 2. Near resolution of subarachnoid blood with trace residual focus over the right convexity. Electronically Signed   By: Deatra Robinson M.D.   On: 10/27/2018 01:09        Scheduled Meds: . atenolol  100 mg Oral Daily  . lisinopril  20 mg Oral Daily  . multivitamin  1 tablet Oral Daily  . pantoprazole  40 mg Oral Daily  . simvastatin  20 mg Oral QPM   Continuous Infusions: . sodium chloride Stopped (10/28/18 0818)     LOS: 4 days     Jacquelin Hawking, MD [facility] 10/28/2018, 2:33 PM  If 7PM-7AM, please contact  night-coverage [email or web address]

## 2018-10-28 NOTE — Progress Notes (Signed)
Attempted to collect urine sample multiple times with female urinal, however pt having multiple episodes of incontinence. Will continue attempting to collect.

## 2018-10-28 NOTE — TOC Initial Note (Signed)
Transition of Care [facility](TOC) - Initial/Assessment Note    Patient Details  Name: Bartholomew BoardsLoretta J Beckles MRN: [id number] Date of Birth: 12/17/1940  Transition of Care [facility](TOC) CM/SW Contact:    Baldemar LenisElizabeth M Maxwell Lemen, LCSW Phone Number: 10/28/2018, 3:05 PM  Clinical Narrative: CSW spoke with patient's son, Rosalia HammersRay, to discuss recommendation for SNF. CSW presented information on insurance coverage and facility options available, texted him CMS list to review. Ray requested that CSW speak with his brother, Reita ClicheBobby, on any additional information regarding supplemental coverage the patient may have or about specific facilities. CSW missed Bobby's call, called him back. Awaiting return call to discuss with him. Family agreeable to SNF, faxed out referral.                  Expected Discharge Plan: Skilled Nursing Facility Barriers to Discharge: Continued Medical Work up, English as a second language teachernsurance Authorization   Patient Goals and CMS Choice Patient states their goals for this hospitalization and ongoing recovery are:: patient unable to participate in goal setting at this time CMS Medicare.gov Compare Post Acute Care list provided to:: Patient Represenative (must comment) Choice offered to / list presented to : Adult Children  Expected Discharge Plan and Services Expected Discharge Plan: Skilled Nursing Facility     Post Acute Care Choice: Skilled Nursing Facility Living arrangements for the past 2 months: Single Family Home Expected Discharge Date: 10/27/18                                    Prior Living Arrangements/Services Living arrangements for the past 2 months: Single Family Home Lives with:: Self          Need for Family Participation in Patient Care: Yes (Comment) Care giver support system in place?: No (comment)      Activities of Daily Living Home Assistive Devices/Equipment: None ADL Screening (condition at time of admission) Patient's cognitive ability adequate to safely complete daily  activities?: Yes Is the patient deaf or have difficulty hearing?: No Does the patient have difficulty seeing, even when wearing glasses/contacts?: No Does the patient have difficulty concentrating, remembering, or making decisions?: No Patient able to express need for assistance with ADLs?: Yes Does the patient have difficulty dressing or bathing?: No Independently performs ADLs?: Yes (appropriate for developmental age) Does the patient have difficulty walking or climbing stairs?: Yes Weakness of Legs: Both Weakness of Arms/Hands: None  Permission Sought/Granted                  Emotional Assessment Appearance:: Appears stated age Attitude/Demeanor/Rapport: Unable to Assess Affect (typically observed): Unable to Assess Orientation: : Oriented to Self Alcohol / Substance Use: Not Applicable Psych Involvement: No (comment)  Admission diagnosis:  Hypokalemia [E87.6] Hyponatremia [E87.1] Subarachnoid bleed (HCC) [I60.9] Facial laceration, initial encounter [S01.81XA] Patient Active Problem List   Diagnosis Date Noted  . SAH (subarachnoid hemorrhage) (HCC) 10/24/2018  . Fall at home, initial encounter 10/24/2018  . HTN (hypertension) 10/24/2018  . New onset a-fib (HCC) 10/24/2018  . CVA (cerebral vascular accident) (HCC) 10/24/2018  . Hyponatremia 10/24/2018  . Hypokalemia 10/24/2018   PCP:  [name], [name], MD Pharmacy:   [id number] - [place], [place] - [address] [place] [id number] Phone: [phone] Fax: [phone]  [facility] [place] [address] [place]  [address] Phone: [phone] Fax: [phone]     Social Determinants of Health (SDOH) Interventions    Readmission Risk Interventions No flowsheet data found.

## 2018-10-28 NOTE — Care Management Important Message (Signed)
Important Message  Patient Details  Name: Tammy Navarro MRN: [id number] Date of Birth: 28-Mar-1941   Medicare Important Message Given:  Yes    Dorena Bodo 10/28/2018, 2:39 PM

## 2018-10-29 LAB — CBC
HCT: 40.7 % (ref 36.0–46.0)
Hemoglobin: 14.8 g/dL (ref 12.0–15.0)
MCH: 31.8 pg (ref 26.0–34.0)
MCHC: 36.4 g/dL — ABNORMAL HIGH (ref 30.0–36.0)
MCV: 87.3 fL (ref 80.0–100.0)
Platelets: 279 10*3/uL (ref 150–400)
RBC: 4.66 MIL/uL (ref 3.87–5.11)
RDW: 12.5 % (ref 11.5–15.5)
WBC: 5.8 10*3/uL (ref 4.0–10.5)
nRBC: 0 % (ref 0.0–0.2)

## 2018-10-29 LAB — BASIC METABOLIC PANEL
Anion gap: 10 (ref 5–15)
BUN: 5 mg/dL — ABNORMAL LOW (ref 8–23)
CO2: 27 mmol/L (ref 22–32)
Calcium: 8.8 mg/dL — ABNORMAL LOW (ref 8.9–10.3)
Chloride: 93 mmol/L — ABNORMAL LOW (ref 98–111)
Creatinine, Ser: 0.55 mg/dL (ref 0.44–1.00)
GFR calc Af Amer: >60 mL/min (ref >60)
GFR calc non Af Amer: >60 mL/min (ref >60)
Glucose, Bld: 116 mg/dL — ABNORMAL HIGH (ref 70–99)
Potassium: 3.4 mmol/L — ABNORMAL LOW (ref 3.5–5.1)
Sodium: 130 mmol/L — ABNORMAL LOW (ref 135–145)

## 2018-10-29 LAB — SODIUM, URINE, RANDOM: Sodium, Ur: 147 mmol/L

## 2018-10-29 LAB — MAGNESIUM: Magnesium: 1.9 mg/dL (ref 1.7–2.4)

## 2018-10-29 LAB — OSMOLALITY, URINE: Osmolality, Ur: 389 mOsm/kg (ref 300–900)

## 2018-10-29 MED ORDER — LORAZEPAM 2 MG/ML IJ SOLN
1.0000 mg | Freq: Once | INTRAMUSCULAR | Status: AC
  Administered 2018-10-29: 1 mg via INTRAVENOUS
  Filled 2018-10-29: qty 1

## 2018-10-29 MED ORDER — QUETIAPINE FUMARATE 25 MG PO TABS
25.0000 mg | ORAL_TABLET | Freq: Every day | ORAL | Status: DC
  Administered 2018-10-29 – 2018-10-31 (×3): 25 mg via ORAL
  Filled 2018-10-29 (×3): qty 1

## 2018-10-29 MED ORDER — SODIUM CHLORIDE 1 G PO TABS
1.0000 g | ORAL_TABLET | Freq: Two times a day (BID) | ORAL | Status: DC
  Administered 2018-10-29 – 2018-11-01 (×6): 1 g via ORAL
  Filled 2018-10-29 (×7): qty 1

## 2018-10-29 NOTE — Progress Notes (Signed)
PROGRESS NOTE    Tammy Navarro  GYF:[id number] DOB: 1940-08-12 DOA: 10/24/2018 PCP: Gaspar Garbe, MD   Brief Narrative: Tammy Navarro is a 78 y.o. female with a history of hypertension. She presented after a fall and found to have a SAH and SDH. She had associated metabolic encephalopathy which is improving.   Assessment & Plan:   Principal Problem:   SAH (subarachnoid hemorrhage) (HCC) Active Problems:   Fall at home, initial encounter   HTN (hypertension)   New onset a-fib (HCC)   CVA (cerebral vascular accident) (HCC)   Hyponatremia   Hypokalemia   Subarachnoid hemorrhage Subdural hemorrhage Traumatic and secondary to fall. Subarachnoid is resolving and frontal lobe subdural hemorrhage is stable on repeat CT scan. Mental status is stable. Hemoglobin stable. Neurology consulted and have recommendations to start aspirin on discharge; currently signed off.  Atrial fibrillation New onset. CHA2DS2-VASc Score is 4. however, high risk of fall and in setting of dementia and falls risks, neurology recommends at most aspirin and no anticoagulation. -Continue atenolol  Acute metabolic encephalopathy Per chart review, patient seems to be baseline today. EEG negative for seizure activity. Subacute stroke seen on imagine. Does not seem as oriented today. Continues to be agitated with attempts to get out of bed per nursing. -Start Seroquel 25 mg qhs  Hyperlipidemia -Continue simvastatin  Essential hypertension -Continue atenolol and lisinopril -hydrochlorothiazide discontinued  Hyponatremia Thought secondary to dehydration and hydrochlorothiazide. Urine studies ordered and pending. AM cortisol checked on 5/13 and is normal. Urine studies suggest some salt wasting. Possibly secondary to head injury. -Hold IV fluids -Start salt tablets BID today -Repeat BMP in AM  Hyperkalemia Hypomagnesemia Replete.   DVT prophylaxis: SCDs Code Status:   Code Status: Full Code  Family Communication: None Disposition Plan: Discharge to SNF when sitter discontinued x24 hours and improvement of electrolyte abnormalities   Consultants:   Neurology  Procedures:   None  Antimicrobials:  None    Subjective: No issues per patient. Cannot tell me why she keeps trying to get up.  Objective: Vitals:   10/28/18 2359 10/29/18 0418 10/29/18 0500 10/29/18 0820  BP: 133/86 (!) 184/109 (!) 168/99 (!) 141/91  Pulse: 75 (!) 104 65 60  Resp: 15 16  18   Temp: 97.9 F (36.6 C)  97.8 F (36.6 C) (!) 97.5 F (36.4 C)  TempSrc: Oral  Oral Oral  SpO2: 99% 97%  99%  Weight:      Height:        Intake/Output Summary (Last 24 hours) at 10/29/2018 1129 Last data filed at 10/29/2018 0800 Gross per 24 hour  Intake 1510.8 ml  Output 200 ml  Net 1310.8 ml   Filed Weights   10/24/18 1509  Weight: 59 kg    Examination:  General exam: Appears calm and comfortable Respiratory system: Clear to auscultation. Respiratory effort normal. Cardiovascular system: S1 & S2 heard, RRR. No murmurs, rubs, gallops or clicks. Gastrointestinal system: Abdomen is nondistended, soft and nontender. No organomegaly or masses felt. Normal bowel sounds heard. Central nervous system: Alert and oriented to person. No focal neurological deficits. 5/5 strength in upper extremity and 4/5 strength in lower extremity Extremities: No edema. No calf tenderness Skin: No cyanosis. Scab on left face Psychiatry: Judgement and insight appear imapired. Mood & affect appropriate. .     Data Reviewed: I have personally reviewed following labs and imaging studies  CBC: Recent Labs  Lab 10/24/18 1545 10/25/18 0331 10/26/18 0440 10/27/18 0351 10/28/18 0343  10/29/18 0348  WBC 6.7 7.7 5.8 10.0 5.8 5.8  NEUTROABS 5.0  --   --   --   --   --   HGB 14.2 13.0 13.2 13.7 12.3 14.8  HCT 39.3 35.8* 37.4 37.8 34.0* 40.7  MCV 86.4 87.3 87.8 86.9 86.3 87.3  PLT 264 232 239 259 256 279   Basic Metabolic  Panel: Recent Labs  Lab 10/25/18 0331 10/25/18 1525 10/26/18 0440 10/27/18 0351 10/28/18 0343 10/29/18 0348  NA 130*  --  130* 129* 129* 130*  K 2.7* 3.2* 2.9* 3.6 3.0* 3.4*  CL 92*  --  90* 95* 93* 93*  CO2 25  --  22 21* 25 27  GLUCOSE 80  --  78 105* 105* 116*  BUN 11  --  9 5* 7* 5*  CREATININE 0.62  --  0.65 0.69 0.56 0.55  CALCIUM 8.8*  --  9.0 8.8* 9.0 8.8*  MG  --  1.7 1.6* 1.9 1.6* 1.9   GFR: Estimated Creatinine Clearance: 53.1 mL/min (by C-G formula based on SCr of 0.55 mg/dL). Liver Function Tests: Recent Labs  Lab 10/24/18 1545 10/25/18 0331  AST 29 26  ALT 16 14  ALKPHOS 70 57  BILITOT 1.1 1.3*  PROT 6.8 6.0*  ALBUMIN 3.7 3.1*   No results for input(s): LIPASE, AMYLASE in the last 168 hours. No results for input(s): AMMONIA in the last 168 hours. Coagulation Profile: No results for input(s): INR, PROTIME in the last 168 hours. Cardiac Enzymes: Recent Labs  Lab 10/24/18 1545  TROPONINI <0.03   BNP (last 3 results) No results for input(s): PROBNP in the last 8760 hours. HbA1C: No results for input(s): HGBA1C in the last 72 hours. CBG: Recent Labs  Lab 10/27/18 1644 10/27/18 2200 10/28/18 0039 10/28/18 0441  GLUCAP 131* 114* 107* 95   Lipid Profile: No results for input(s): CHOL, HDL, LDLCALC, TRIG, CHOLHDL, LDLDIRECT in the last 72 hours. Thyroid Function Tests: Recent Labs    10/26/18 1130  TSH 2.222   Anemia Panel: Recent Labs    10/26/18 1130  VITAMINB12 514   Sepsis Labs: No results for input(s): PROCALCITON, LATICACIDVEN in the last 168 hours.  Recent Results (from the past 240 hour(s))  SARS Coronavirus 2 (CEPHEID - Performed in [facility] lab), Hosp Order     Status: None   Collection Time: 10/24/18  7:17 PM  Result Value Ref Range Status   SARS Coronavirus 2 NEGATIVE NEGATIVE Final    Comment: (NOTE) If result is NEGATIVE SARS-CoV-2 target nucleic acids are NOT DETECTED. The SARS-CoV-2 RNA is generally  detectable in upper and lower  respiratory specimens during the acute phase of infection. The lowest  concentration of SARS-CoV-2 viral copies this assay can detect is 250  copies / mL. A negative result does not preclude SARS-CoV-2 infection  and should not be used as the sole basis for treatment or other  patient management decisions.  A negative result may occur with  improper specimen collection / handling, submission of specimen other  than nasopharyngeal swab, presence of viral mutation(s) within the  areas targeted by this assay, and inadequate number of viral copies  (<250 copies / mL). A negative result must be combined with clinical  observations, patient history, and epidemiological information. If result is POSITIVE SARS-CoV-2 target nucleic acids are DETECTED. The SARS-CoV-2 RNA is generally detectable in upper and lower  respiratory specimens dur ing the acute phase of infection.  Positive  results are  indicative of active infection with SARS-CoV-2.  Clinical  correlation with patient history and other diagnostic information is  necessary to determine patient infection status.  Positive results do  not rule out bacterial infection or co-infection with other viruses. If result is PRESUMPTIVE POSTIVE SARS-CoV-2 nucleic acids MAY BE PRESENT.   A presumptive positive result was obtained on the submitted specimen  and confirmed on repeat testing.  While 2019 novel coronavirus  (SARS-CoV-2) nucleic acids may be present in the submitted sample  additional confirmatory testing may be necessary for epidemiological  and / or clinical management purposes  to differentiate between  SARS-CoV-2 and other Sarbecovirus currently known to infect humans.  If clinically indicated additional testing with an alternate test  methodology [phone]) is advised. The SARS-CoV-2 RNA is generally  detectable in upper and lower respiratory sp ecimens during the acute  phase of infection. The  expected result is Negative. Fact Sheet for Patients:  BoilerBrush.com.cy Fact Sheet for Healthcare Providers: https://pope.com/ This test is not yet approved or cleared by the [place] FDA and has been authorized for detection and/or diagnosis of SARS-CoV-2 by FDA under an Emergency Use Authorization (EUA).  This EUA will remain in effect (meaning this test can be used) for the duration of the COVID-19 declaration under Section 564(b)(1) of the Act, 21 U.S.C. section 360bbb-3(b)(1), unless the authorization is terminated or revoked sooner. Performed at [facility], [address], [place], [place] [address]          Radiology Studies: No results found.      Scheduled Meds: . atenolol  100 mg Oral Daily  . lisinopril  20 mg Oral Daily  . multivitamin  1 tablet Oral Daily  . pantoprazole  40 mg Oral Daily  . QUEtiapine  25 mg Oral QHS  . simvastatin  20 mg Oral QPM   Continuous Infusions: . sodium chloride 75 mL/hr at 10/28/18 1853     LOS: 5 days     Jacquelin Hawking, MD [facility] 10/29/2018, 11:29 AM  If 7PM-7AM, please contact night-coverage [email or web address]

## 2018-10-29 NOTE — Plan of Care (Signed)

## 2018-10-29 NOTE — Plan of Care (Signed)
Progressing toward goals. 

## 2018-10-29 NOTE — Progress Notes (Signed)
Physical Therapy Treatment Patient Details Name: Tammy Navarro MRN: [id number] DOB: 02/07/1941 Today's Date: 10/29/2018    History of Present Illness Pt is a 78 y/o female found after [place] [employer], unwittnessed fall with no recall of the event.  Imaging reveals small subarachnoid hemorrhage. PMH: HTN, a fib.     PT Comments    She continues to be agreeable with PT saying "I want to get out of bed" She still needs min A with bed mobility and was able to only need + 1 assistance today but she is mod A for sit to stand and short ambulation with RW. She remains very confused and very incontinent. PT will continue to progress her as able but continueing to recommend SNF at this time  Follow Up Recommendations    SNF unless can get 24 hour assistance and supervision        Precautions / Restrictions Precautions Precautions: Fall Precaution Comments: incontinence  Restrictions Weight Bearing Restrictions: No    Mobility  Bed Mobility Overal bed mobility: Needs Assistance Bed Mobility: Supine to Sit;Sit to Supine     Supine to sit: Min assist Sit to supine: Min guard   General bed mobility comments: verbal cues for initiation with minA for trunk ; cues to scoot hips forward  Transfers Overall transfer level: Needs assistance Equipment used: Rolling walker (2 wheeled) Transfers: Sit to/from Stand Sit to Stand: Mod assist         General transfer comment: to ascend, for balance and safety; performed x3 during session and able to maintain static balance with minguard-minA while NT assists with pericare  Ambulation/Gait Ambulation/Gait assistance: Mod assist Gait Distance (Feet): 20 Feet Assistive device: Rolling walker (2 wheeled) Gait Pattern/deviations: Step-through pattern;Decreased stride length;Trunk flexed;Shuffle Gait velocity: slow Gait velocity interpretation: <1.31 ft/sec, indicative of household ambulator General Gait Details: stayed in room  due to her multiple bouts on incontenence. pt slow requiring max tactile cues to cont forward movement and to manage RW, she would stop at times and stand still.           Balance Overall balance assessment: Needs assistance Sitting-balance support: No upper extremity supported;Feet supported Sitting balance-Leahy Scale: Fair     Standing balance support: Bilateral upper extremity supported;During functional activity Standing balance-Leahy Scale: Poor Standing balance comment: relaint on BUE and external support                            Cognition Arousal/Alertness: Lethargic Behavior During Therapy: Flat affect;Restless Overall Cognitive Status: Impaired/Different from baseline Area of Impairment: Orientation;Attention;Memory;Following commands;Safety/judgement;Awareness;Problem solving                 Orientation Level: Disoriented to;Time;Situation(able to state hospital given multiple choice)     Following Commands: Follows one step commands inconsistently Safety/Judgement: Decreased awareness of safety;Decreased awareness of deficits            Exercises        PT Goals (current goals can now be found in the care plan section) Acute Rehab PT Goals Patient Stated Goal: none stated PT Goal Formulation: Patient unable to participate in goal setting Time For Goal Achievement: 11/09/18 Potential to Achieve Goals: Fair Progress towards PT goals: (very slow progress)    Frequency    Min 3X/week      PT Plan Current plan remains appropriate          End of Session Equipment Utilized  During Treatment: Gait belt Activity Tolerance: Patient tolerated treatment well Patient left: with call bell/phone within reach;in bed;with bed alarm set;with nursing/sitter in room Nurse Communication: Mobility status PT Visit Diagnosis: Unsteadiness on feet (R26.81);Muscle weakness (generalized) (M62.81);Difficulty in walking, not elsewhere classified  (R26.2)     Time: 8466-5993 PT Time Calculation (min) (ACUTE ONLY): 30 min  Charges:  $Gait Training: 8-22 mins $Therapeutic Activity: 8-22 mins                     Ivery Quale, PT,DPT Acute Rehabilitation Services Office [phone]    Tammy Navarro 10/29/2018, 4:04 PM

## 2018-10-30 LAB — MAGNESIUM: Magnesium: 1.6 mg/dL — ABNORMAL LOW (ref 1.7–2.4)

## 2018-10-30 LAB — BASIC METABOLIC PANEL
Anion gap: 10 (ref 5–15)
BUN: 10 mg/dL (ref 8–23)
CO2: 25 mmol/L (ref 22–32)
Calcium: 9 mg/dL (ref 8.9–10.3)
Chloride: 94 mmol/L — ABNORMAL LOW (ref 98–111)
Creatinine, Ser: 0.67 mg/dL (ref 0.44–1.00)
GFR calc Af Amer: >60 mL/min (ref >60)
GFR calc non Af Amer: >60 mL/min (ref >60)
Glucose, Bld: 99 mg/dL (ref 70–99)
Potassium: 2.9 mmol/L — ABNORMAL LOW (ref 3.5–5.1)
Sodium: 129 mmol/L — ABNORMAL LOW (ref 135–145)

## 2018-10-30 LAB — CBC
HCT: 36.1 % (ref 36.0–46.0)
Hemoglobin: 13.2 g/dL (ref 12.0–15.0)
MCH: 31.4 pg (ref 26.0–34.0)
MCHC: 36.6 g/dL — ABNORMAL HIGH (ref 30.0–36.0)
MCV: 86 fL (ref 80.0–100.0)
Platelets: 245 10*3/uL (ref 150–400)
RBC: 4.2 MIL/uL (ref 3.87–5.11)
RDW: 12.6 % (ref 11.5–15.5)
WBC: 6.1 10*3/uL (ref 4.0–10.5)
nRBC: 0 % (ref 0.0–0.2)

## 2018-10-30 MED ORDER — SODIUM CHLORIDE 0.9 % IV SOLN
INTRAVENOUS | Status: DC
  Administered 2018-10-30: 12:00:00 via INTRAVENOUS

## 2018-10-30 MED ORDER — POTASSIUM CHLORIDE CRYS ER 20 MEQ PO TBCR
40.0000 meq | EXTENDED_RELEASE_TABLET | ORAL | Status: AC
  Administered 2018-10-30 (×2): 40 meq via ORAL
  Filled 2018-10-30 (×2): qty 2

## 2018-10-30 NOTE — Progress Notes (Signed)
PROGRESS NOTE    Tammy BoardsLoretta J Brager  ZOX:[id number] DOB: 02/22/1941 DOA: 10/24/2018 PCP: [name], [name], MD   Brief Narrative: Tammy Navarro is a 78 y.o. female with a history of hypertension. She presented after a fall and found to have a SAH and SDH. She had associated metabolic encephalopathy which is improving.   Assessment & Plan:   Principal Problem:   SAH (subarachnoid hemorrhage) (HCC) Active Problems:   Fall at home, initial encounter   HTN (hypertension)   New onset a-fib (HCC)   CVA (cerebral vascular accident) (HCC)   Hyponatremia   Hypokalemia   Subarachnoid hemorrhage Subdural hemorrhage Traumatic and secondary to fall. Subarachnoid is resolving and frontal lobe subdural hemorrhage is stable on repeat CT scan. Mental status is stable. Hemoglobin stable. Neurology consulted and have recommendations to start aspirin on discharge; currently signed off.  Atrial fibrillation New onset. CHA2DS2-VASc Score is 4. however, high risk of fall and in setting of dementia and falls risks, neurology recommends at most aspirin and no anticoagulation. -Continue atenolol  Acute metabolic encephalopathy Per chart review. EEG negative for seizure activity. Subacute stroke seen on imagine. Continues to be agitated with attempts to get out of bed per nursing. Some improvement with Seroquel per report -Continue Seroquel 25 mg qhs  Hyperlipidemia -Continue simvastatin  Essential hypertension -Continue atenolol and lisinopril -hydrochlorothiazide discontinued  Hyponatremia Thought secondary to dehydration and hydrochlorothiazide. Urine studies ordered and pending. AM cortisol checked on 5/13 and is normal. Urine studies suggest some salt wasting. Possibly secondary to head injury.  -Restart IV fluids as patient's oral intake is poor -Continue salt tablets BID today -Repeat BMP in AM  Hypokalemia Hypomagnesemia Potassium low again today -Kdur 40 meq x2   DVT  prophylaxis: SCDs Code Status:   Code Status: Full Code Family Communication: None Disposition Plan: Discharge to SNF when sitter discontinued x24 hours and improvement of electrolyte abnormalities   Consultants:   Neurology  Procedures:   None  Antimicrobials:  None    Subjective: No concerns today. No headache.  Objective: Vitals:   10/29/18 2026 10/30/18 0000 10/30/18 0347 10/30/18 0737  BP: (!) 160/92 (!) 146/92 (!) 107/95 137/80  Pulse: 75 (!) 55 66 (!) 58  Resp: 14 18 16 15   Temp: 98.2 F (36.8 C) (!) 97.4 F (36.3 C) 97.7 F (36.5 C) (!) 97.4 F (36.3 C)  TempSrc: Oral Oral Oral Oral  SpO2: 98% 98% 98% 100%  Weight:      Height:        Intake/Output Summary (Last 24 hours) at 10/30/2018 1005 Last data filed at 10/30/2018 0042 Gross per 24 hour  Intake 149 ml  Output 700 ml  Net -551 ml   Filed Weights   10/24/18 1509  Weight: 59 kg    Examination:  General exam: Appears calm and comfortable Respiratory system: Clear to auscultation. Respiratory effort normal. Cardiovascular system: S1 & S2 heard, RRR. No murmurs, rubs, gallops or clicks. Gastrointestinal system: Abdomen is nondistended, soft and nontender. No organomegaly or masses felt. Normal bowel sounds heard. Central nervous system: Alert and oriented to person. No focal neurological deficits. Extremities: No edema. No calf tenderness Skin: No cyanosis. Psychiatry: Judgement and insight appear impaired. Mood & affect appropriate.     Data Reviewed: I have personally reviewed following labs and imaging studies  CBC: Recent Labs  Lab 10/24/18 1545  10/26/18 0440 10/27/18 0351 10/28/18 0343 10/29/18 0348 10/30/18 0454  WBC 6.7   < > 5.8 10.0  5.8 5.8 6.1  NEUTROABS 5.0  --   --   --   --   --   --   HGB 14.2   < > 13.2 13.7 12.3 14.8 13.2  HCT 39.3   < > 37.4 37.8 34.0* 40.7 36.1  MCV 86.4   < > 87.8 86.9 86.3 87.3 86.0  PLT 264   < > 239 259 256 279 245   < > = values in this  interval not displayed.   Basic Metabolic Panel: Recent Labs  Lab 10/26/18 0440 10/27/18 0351 10/28/18 0343 10/29/18 0348 10/30/18 0454  NA 130* 129* 129* 130* 129*  K 2.9* 3.6 3.0* 3.4* 2.9*  CL 90* 95* 93* 93* 94*  CO2 22 21* 25 27 25   GLUCOSE 78 105* 105* 116* 99  BUN 9 5* 7* 5* 10  CREATININE 0.65 0.69 0.56 0.55 0.67  CALCIUM 9.0 8.8* 9.0 8.8* 9.0  MG 1.6* 1.9 1.6* 1.9 1.6*   GFR: Estimated Creatinine Clearance: 53.1 mL/min (by C-G formula based on SCr of 0.67 mg/dL). Liver Function Tests: Recent Labs  Lab 10/24/18 1545 10/25/18 0331  AST 29 26  ALT 16 14  ALKPHOS 70 57  BILITOT 1.1 1.3*  PROT 6.8 6.0*  ALBUMIN 3.7 3.1*   No results for input(s): LIPASE, AMYLASE in the last 168 hours. No results for input(s): AMMONIA in the last 168 hours. Coagulation Profile: No results for input(s): INR, PROTIME in the last 168 hours. Cardiac Enzymes: Recent Labs  Lab 10/24/18 1545  TROPONINI <0.03   BNP (last 3 results) No results for input(s): PROBNP in the last 8760 hours. HbA1C: No results for input(s): HGBA1C in the last 72 hours. CBG: Recent Labs  Lab 10/27/18 1644 10/27/18 2200 10/28/18 0039 10/28/18 0441  GLUCAP 131* 114* 107* 95   Lipid Profile: No results for input(s): CHOL, HDL, LDLCALC, TRIG, CHOLHDL, LDLDIRECT in the last 72 hours. Thyroid Function Tests: No results for input(s): TSH, T4TOTAL, FREET4, T3FREE, THYROIDAB in the last 72 hours. Anemia Panel: No results for input(s): VITAMINB12, FOLATE, FERRITIN, TIBC, IRON, RETICCTPCT in the last 72 hours. Sepsis Labs: No results for input(s): PROCALCITON, LATICACIDVEN in the last 168 hours.  Recent Results (from the past 240 hour(s))  SARS Coronavirus 2 (CEPHEID - Performed in [facility] hospital lab), Hosp Order     Status: None   Collection Time: 10/24/18  7:17 PM  Result Value Ref Range Status   SARS Coronavirus 2 NEGATIVE NEGATIVE Final    Comment: (NOTE) If result is NEGATIVE SARS-CoV-2  target nucleic acids are NOT DETECTED. The SARS-CoV-2 RNA is generally detectable in upper and lower  respiratory specimens during the acute phase of infection. The lowest  concentration of SARS-CoV-2 viral copies this assay can detect is 250  copies / mL. A negative result does not preclude SARS-CoV-2 infection  and should not be used as the sole basis for treatment or other  patient management decisions.  A negative result may occur with  improper specimen collection / handling, submission of specimen other  than nasopharyngeal swab, presence of viral mutation(s) within the  areas targeted by this assay, and inadequate number of viral copies  (<250 copies / mL). A negative result must be combined with clinical  observations, patient history, and epidemiological information. If result is POSITIVE SARS-CoV-2 target nucleic acids are DETECTED. The SARS-CoV-2 RNA is generally detectable in upper and lower  respiratory specimens dur ing the acute phase of infection.  Positive  results  are indicative of active infection with SARS-CoV-2.  Clinical  correlation with patient history and other diagnostic information is  necessary to determine patient infection status.  Positive results do  not rule out bacterial infection or co-infection with other viruses. If result is PRESUMPTIVE POSTIVE SARS-CoV-2 nucleic acids MAY BE PRESENT.   A presumptive positive result was obtained on the submitted specimen  and confirmed on repeat testing.  While 2019 novel coronavirus  (SARS-CoV-2) nucleic acids may be present in the submitted sample  additional confirmatory testing may be necessary for epidemiological  and / or clinical management purposes  to differentiate between  SARS-CoV-2 and other Sarbecovirus currently known to infect humans.  If clinically indicated additional testing with an alternate test  methodology [id number]) is advised. The SARS-CoV-2 RNA is generally  detectable in upper and lower  respiratory sp ecimens during the acute  phase of infection. The expected result is Negative. Fact Sheet for Patients:  BoilerBrush.com.cy Fact Sheet for Healthcare Providers: https://pope.com/ This test is not yet approved or cleared by the [place] FDA and has been authorized for detection and/or diagnosis of SARS-CoV-2 by FDA under an Emergency Use Authorization (EUA).  This EUA will remain in effect (meaning this test can be used) for the duration of the COVID-19 declaration under Section 564(b)(1) of the Act, 21 U.S.C. section 360bbb-3(b)(1), unless the authorization is terminated or revoked sooner. Performed at [facility], [address], [place], [place] [address]          Radiology Studies: No results found.      Scheduled Meds: . atenolol  100 mg Oral Daily  . lisinopril  20 mg Oral Daily  . multivitamin  1 tablet Oral Daily  . pantoprazole  40 mg Oral Daily  . QUEtiapine  25 mg Oral QHS  . simvastatin  20 mg Oral QPM  . sodium chloride  1 g Oral BID WC   Continuous Infusions:    LOS: 6 days     Jacquelin Hawking, MD [facility] 10/30/2018, 10:05 AM  If 7PM-7AM, please contact night-coverage [email or web address]

## 2018-10-31 LAB — BASIC METABOLIC PANEL
Anion gap: 12 (ref 5–15)
BUN: 11 mg/dL (ref 8–23)
CO2: 24 mmol/L (ref 22–32)
Calcium: 9.2 mg/dL (ref 8.9–10.3)
Chloride: 96 mmol/L — ABNORMAL LOW (ref 98–111)
Creatinine, Ser: 0.51 mg/dL (ref 0.44–1.00)
GFR calc Af Amer: >60 mL/min (ref >60)
GFR calc non Af Amer: >60 mL/min (ref >60)
Glucose, Bld: 122 mg/dL — ABNORMAL HIGH (ref 70–99)
Potassium: 4 mmol/L (ref 3.5–5.1)
Sodium: 132 mmol/L — ABNORMAL LOW (ref 135–145)

## 2018-10-31 LAB — MAGNESIUM: Magnesium: 1.5 mg/dL — ABNORMAL LOW (ref 1.7–2.4)

## 2018-10-31 MED ORDER — MAGNESIUM SULFATE 2 GM/50ML IV SOLN
2.0000 g | Freq: Once | INTRAVENOUS | Status: AC
  Administered 2018-10-31: 17:00:00 2 g via INTRAVENOUS
  Filled 2018-10-31: qty 50

## 2018-10-31 NOTE — TOC Progression Note (Signed)
Transition of Care [facility]) - Progression Note    Patient Details  Name: Tammy Navarro MRN: [id number] Date of Birth: Feb 04, 1941  Transition of [facility]) CM/SW Contact  Nada Boozer Jamiesha Victoria, LCSWA Phone Number: 10/31/2018, 10:48 AM  Clinical Narrative:     Patient is stable enough to DC. Patient's sitter was discharged Saturday morning. CSW spoke with Lowella Bandy at [facility] and they will be more comfortable accepting the patient on Monday morning.   CSW will assist with discharging the patient first thing Monday morning.      Expected Discharge Plan: Skilled Nursing Facility Barriers to Discharge: Continued Medical Work up, English as a second language teacher  Expected Discharge Plan and Services Expected Discharge Plan: Skilled Nursing Facility     Post Acute Care Choice: Skilled Nursing Facility Living arrangements for the past 2 months: Single Family Home Expected Discharge Date: 10/27/18                                     Social Determinants of Health (SDOH) Interventions    Readmission Risk Interventions No flowsheet data found.

## 2018-10-31 NOTE — Progress Notes (Signed)
PROGRESS NOTE    Tammy Navarro  ZOX:[id number] DOB: [birth year] DOA: 10/24/2018 PCP: Gaspar Garbe, MD   Brief Narrative: Tammy Navarro is a 78 y.o. female with a history of hypertension. She presented after a fall and found to have a SAH and SDH. She had associated metabolic encephalopathy which appears stable.   Assessment & Plan:   Principal Problem:   SAH (subarachnoid hemorrhage) (HCC) Active Problems:   Fall at home, initial encounter   HTN (hypertension)   New onset a-fib (HCC)   CVA (cerebral vascular accident) (HCC)   Hyponatremia   Hypokalemia   Subarachnoid hemorrhage Subdural hemorrhage Traumatic and secondary to fall. Subarachnoid is resolving and frontal lobe subdural hemorrhage is stable on repeat CT scan. Mental status is stable. Hemoglobin stable. Neurology consulted and have recommendations to start aspirin on discharge; currently signed off.  Atrial fibrillation New onset. CHA2DS2-VASc Score is 4. however, high risk of fall and in setting of dementia and falls risks, neurology recommends at most aspirin and no anticoagulation. -Continue atenolol  Acute metabolic encephalopathy Per chart review. EEG negative for seizure activity. Subacute stroke seen on imagine. Continues to be agitated with attempts to get out of bed per nursing. Some improvement with Seroquel per report -Continue Seroquel 25 mg qhs  Hyperlipidemia -Continue simvastatin  Essential hypertension -Continue atenolol and lisinopril -hydrochlorothiazide discontinued  Hyponatremia Thought secondary to dehydration and hydrochlorothiazide. Urine studies ordered and pending. AM cortisol checked on 5/13 and is normal. Urine studies suggest some salt wasting. Possibly secondary to head injury.  -Continue IV fluids -Continue salt tablets BID today -BMP pending today  Hypokalemia Hypomagnesemia BMP pending.  Status post repletion.   DVT prophylaxis: SCDs Code Status:   Code  Status: Full Code Family Communication: None Disposition Plan: Discharge to SNF tomorrow if electrolytes stable   Consultants:   Neurology  Procedures:   None  Antimicrobials:  None    Subjective: No headache.  No complaints.  Objective: Vitals:   10/30/18 2003 10/31/18 0007 10/31/18 0338 10/31/18 0909  BP: (!) 157/98 (!) 148/92 (!) 173/95 (!) 164/88  Pulse: 81 (!) 49 83 84  Resp: Temp: 98.7 F (37.1 C) 98.1 F (36.7 C) 97.7 F (36.5 C) 99.4 F (37.4 C)  TempSrc: Oral Oral Oral Oral  SpO2: 98% 99% 96% 95%  Weight:      Height:        Intake/Output Summary (Last 24 hours) at 10/31/2018 1140 Last data filed at 10/31/2018 0945 Gross per 24 hour  Intake 1992.72 ml  Output 900 ml  Net 1092.72 ml   Filed Weights   10/24/18 1509  Weight: 59 kg    Examination:  General exam: Appears calm and comfortable  Respiratory system: Clear to auscultation. Respiratory effort normal. Cardiovascular system: S1 & S2 heard, RRR. No murmurs, rubs, gallops or clicks. Gastrointestinal system: Abdomen is nondistended, soft and nontender. No organomegaly or masses felt. Normal bowel sounds heard. Central nervous system: Alert and oriented to person and place. Extremities: No edema. No calf tenderness Skin: No cyanosis Psychiatry: Judgement and insight appear impaired.  Flat affect    Data Reviewed: I have personally reviewed following labs and imaging studies  CBC: Recent Labs  Lab 10/24/18 1545  10/26/18 0440 10/27/18 0351 10/28/18 0343 10/29/18 0348 10/30/18 0454  WBC 6.7   < > 5.8 10.0 5.8 5.8 6.1  NEUTROABS 5.0  --   --   --   --   --   --  HGB 14.2   < > 13.2 13.7 12.3 14.8 13.2  HCT 39.3   < > 37.4 37.8 34.0* 40.7 36.1  MCV 86.4   < > 87.8 86.9 86.3 87.3 86.0  PLT 264   < > 239 259 256 279 245   < > = values in this interval not displayed.   Basic Metabolic Panel: Recent Labs  Lab 10/26/18 0440 10/27/18 0351 10/28/18 0343 10/29/18 0348  10/30/18 0454  NA 130* 129* 129* 130* 129*  K 2.9* 3.6 3.0* 3.4* 2.9*  CL 90* 95* 93* 93* 94*  CO2 22 21* 25 27 25   GLUCOSE 78 105* 105* 116* 99  BUN 9 5* 7* 5* 10  CREATININE 0.65 0.69 0.56 0.55 0.67  CALCIUM 9.0 8.8* 9.0 8.8* 9.0  MG 1.6* 1.9 1.6* 1.9 1.6*   GFR: Estimated Creatinine Clearance: 53.1 mL/min (by C-G formula based on SCr of 0.67 mg/dL). Liver Function Tests: Recent Labs  Lab 10/24/18 1545 10/25/18 0331  AST 29 26  ALT 16 14  ALKPHOS 70 57  BILITOT 1.1 1.3*  PROT 6.8 6.0*  ALBUMIN 3.7 3.1*   No results for input(s): LIPASE, AMYLASE in the last 168 hours. No results for input(s): AMMONIA in the last 168 hours. Coagulation Profile: No results for input(s): INR, PROTIME in the last 168 hours. Cardiac Enzymes: Recent Labs  Lab 10/24/18 1545  TROPONINI <0.03   BNP (last 3 results) No results for input(s): PROBNP in the last 8760 hours. HbA1C: No results for input(s): HGBA1C in the last 72 hours. CBG: Recent Labs  Lab 10/27/18 1644 10/27/18 2200 10/28/18 0039 10/28/18 0441  GLUCAP 131* 114* 107* 95   Lipid Profile: No results for input(s): CHOL, HDL, LDLCALC, TRIG, CHOLHDL, LDLDIRECT in the last 72 hours. Thyroid Function Tests: No results for input(s): TSH, T4TOTAL, FREET4, T3FREE, THYROIDAB in the last 72 hours. Anemia Panel: No results for input(s): VITAMINB12, FOLATE, FERRITIN, TIBC, IRON, RETICCTPCT in the last 72 hours. Sepsis Labs: No results for input(s): PROCALCITON, LATICACIDVEN in the last 168 hours.  Recent Results (from the past 240 hour(s))  SARS Coronavirus 2 (CEPHEID - Performed in [facility] hospital lab), Hosp Order     Status: None   Collection Time: 10/24/18  7:17 PM  Result Value Ref Range Status   SARS Coronavirus 2 NEGATIVE NEGATIVE Final    Comment: (NOTE) If result is NEGATIVE SARS-CoV-2 target nucleic acids are NOT DETECTED. The SARS-CoV-2 RNA is generally detectable in upper and lower  respiratory specimens during  the acute phase of infection. The lowest  concentration of SARS-CoV-2 viral copies this assay can detect is 250  copies / mL. A negative result does not preclude SARS-CoV-2 infection  and should not be used as the sole basis for treatment or other  patient management decisions.  A negative result may occur with  improper specimen collection / handling, submission of specimen other  than nasopharyngeal swab, presence of viral mutation(s) within the  areas targeted by this assay, and inadequate number of viral copies  (<250 copies / mL). A negative result must be combined with clinical  observations, patient history, and epidemiological information. If result is POSITIVE SARS-CoV-2 target nucleic acids are DETECTED. The SARS-CoV-2 RNA is generally detectable in upper and lower  respiratory specimens dur ing the acute phase of infection.  Positive  results are indicative of active infection with SARS-CoV-2.  Clinical  correlation with patient history and other diagnostic information is  necessary to determine patient infection  status.  Positive results do  not rule out bacterial infection or co-infection with other viruses. If result is PRESUMPTIVE POSTIVE SARS-CoV-2 nucleic acids MAY BE PRESENT.   A presumptive positive result was obtained on the submitted specimen  and confirmed on repeat testing.  While 2019 novel coronavirus  (SARS-CoV-2) nucleic acids may be present in the submitted sample  additional confirmatory testing may be necessary for epidemiological  and / or clinical management purposes  to differentiate between  SARS-CoV-2 and other Sarbecovirus currently known to infect humans.  If clinically indicated additional testing with an alternate test  methodology [phone]) is advised. The SARS-CoV-2 RNA is generally  detectable in upper and lower respiratory sp ecimens during the acute  phase of infection. The expected result is Negative. Fact Sheet for Patients:   BoilerBrush.com.cy Fact Sheet for Healthcare Providers: https://pope.com/ This test is not yet approved or cleared by the [place] FDA and has been authorized for detection and/or diagnosis of SARS-CoV-2 by FDA under an Emergency Use Authorization (EUA).  This EUA will remain in effect (meaning this test can be used) for the duration of the COVID-19 declaration under Section 564(b)(1) of the Act, 21 U.S.C. section 360bbb-3(b)(1), unless the authorization is terminated or revoked sooner. Performed at [facility], [address], [place], [place] [address]          Radiology Studies: No results found.      Scheduled Meds: . atenolol  100 mg Oral Daily  . lisinopril  20 mg Oral Daily  . multivitamin  1 tablet Oral Daily  . pantoprazole  40 mg Oral Daily  . QUEtiapine  25 mg Oral QHS  . simvastatin  20 mg Oral QPM  . sodium chloride  1 g Oral BID WC   Continuous Infusions: . sodium chloride 75 mL/hr at 10/31/18 0500     LOS: 7 days     Jacquelin Hawking, MD [facility] 10/31/2018, 11:40 AM  If 7PM-7AM, please contact night-coverage [email or web address]

## 2018-11-01 ENCOUNTER — Encounter (HOSPITAL_COMMUNITY): Payer: Self-pay | Admitting: *Deleted

## 2018-11-01 MED ORDER — SODIUM CHLORIDE 1 G PO TABS: 1.0000 g | ORAL_TABLET | Freq: Two times a day (BID) | ORAL | Status: DC

## 2018-11-01 MED ORDER — LISINOPRIL 20 MG PO TABS: 40.0000 mg | ORAL_TABLET | Freq: Every day | ORAL | Status: DC

## 2018-11-01 MED ORDER — QUETIAPINE FUMARATE 25 MG PO TABS: 25.0000 mg | ORAL_TABLET | Freq: Every day | ORAL | Status: DC

## 2018-11-01 NOTE — Progress Notes (Signed)
Patient discharging to [facility]. Report called to Minette at the facility. IV and telemetry discontinued. [facility] has been notified and will be arriving to transport. Lawson Radar

## 2018-11-01 NOTE — Discharge Summary (Signed)
Physician Discharge Summary  Tammy Navarro HYW:[id number] DOB: [birth year] DOA: 10/24/2018  PCP: Gaspar Garbe, MD  Admit date: 10/24/2018 Discharge date: 11/01/2018  Admitted From: Home Disposition: Home  Recommendations for Outpatient Follow-up:  1. Follow up with PCP in 1 week 2. Please obtain BMP/CBC in one week 3. Fall risk, please use fall precautions with this patient 4. Please follow up on the following pending results: None   Discharge Condition: Stable CODE STATUS: Full code Diet recommendation: Heart healthy   Brief/Interim Summary:  Admission HPI written by Rometta Emery, MD   Chief Complaint: Fall at home  HPI: Tammy Navarro is a 78 y.o. female with medical history significant of hypertension but no prior known significant history who was found on the floor today at [employer] parking lot after sustaining a fall that was not witnessed.  A bystander called 911 and patient was brought to the ER.  She was found to have blood all over the left side of her head and scalp.  Patient is awake now but has no recollection of what happened.  She has no recollection of going to the [employer]  She lives alone at home and according to her son she was supposed to have going to the cystoscopy to get grocery for a Mother's Day meal that they were supposed to have later in the day.  She usually does that alone.  She takes care of herself without much assistance.  Patient is found to have a small subarachnoid hemorrhage, new onset A. fib as well as findings on her CT suggestive of possible CVA.  She is being admitted to the hospital for evaluation and treatment.  She is still unable to give me any adequate history.  She responds to questions but has completely lost memory of what happened earlier today..  ED Course: Temperature is 97.9 blood pressure 174/95 pulse 105 respiratory rate of 26 oxygen sats 97% on room air.  She has a white count of 6.7 hemoglobin 14.2 and  platelets 264.  Sodium 124 potassium 2.8 and chloride 81.  Rest of the chemistry appears to be within normal.  Urinalysis essentially negative except for white count of 11.  Head CT cervical spine and maxillofacial showed nonspecific abnormal hypodensity in the right inferior temporal gyrus.  This include an infarct infection encephalomalacia and less likely tumor related edema.  Also small volume posttraumatic subarachnoid hemorrhage over the convexities.  No mass-effect.  Left periorbital scalp hematoma is noted.  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  Neurosurgery Dr. Danielle Dess consulted and patient is being admitted to the hospital for work-up.    Hospital course:  Subarachnoid hemorrhage Subdural hemorrhage Traumatic and secondary to fall. Subarachnoid is resolving and frontal lobe subdural hemorrhage is stable on repeat CT scan. Mental status is stable. Hemoglobin stable. Neurology consulted and have recommendations to start aspirin on discharge. No acute stroke on MRI, but evidence of subacute vs early chronic infarct of the right temporal lobe. Outpatient follow-up in 4 weeks.  Atrial fibrillation New onset. CHA2DS2-VASc Scoreis 4. however, high risk of fall and in setting of dementia and falls risks, neurology recommends at most aspirin and no anticoagulation. Continue atenolol  Acute metabolic encephalopathy Per chart review. EEG negative for seizure activity. Subacute stroke seen on imagine. Continues to be agitated with attempts to get out of bed per nursing. Some improvement with Seroquel. Discharged one Seroquel 25 mg qhs. Watch for  prolonged QTc. Likely has reached new baseline mental status. Appears to be oriented to self and plus/minus place.  Hyperlipidemia Continue simvastatin  Essential hypertension Continue atenolol and increase to lisinopril 40 mg on discharge. Hydrochlorothiazide discontinued on discharge.  Hyponatremia Thought  secondary to dehydration and hydrochlorothiazide. AM cortisol checked on 5/13 and is normal. Urine studies suggest some salt wasting. Possibly secondary to head injury. Improved with salt tablets. Continue salt tablets but recheck BMP in one week to ensure no hypernatremia and assess for ability to discontinue. May also improve with more solute intake from diet.  Hypokalemia Hypomagnesemia Repletion as needed.  Discharge Diagnoses:  Principal Problem:   SAH (subarachnoid hemorrhage) (HCC) Active Problems:   Fall at home, initial encounter   HTN (hypertension)   New onset a-fib (HCC)   CVA (cerebral vascular accident) (HCC)   Hyponatremia   Hypokalemia    Discharge Instructions  Discharge Instructions    Diet - low sodium heart healthy   Complete by:  As directed    Increase activity slowly   Complete by:  As directed      Allergies as of 11/01/2018   No Known Allergies     Medication List    STOP taking these medications   lisinopril-hydrochlorothiazide 20-12.5 MG tablet Commonly known as:  ZESTORETIC     TAKE these medications   amLODipine 5 MG tablet Commonly known as:  NORVASC Take 5 mg by mouth daily.   atenolol 100 MG tablet Commonly known as:  TENORMIN Take 100 mg by mouth daily.   lisinopril 20 MG tablet Commonly known as:  ZESTRIL Take 2 tablets (40 mg total) by mouth daily. Start taking on:  Nov 02, 2018   loperamide 2 MG tablet Commonly known as:  Imodium A-D Take 1 tablet (2 mg total) by mouth 4 (four) times daily as needed for diarrhea or loose stools.   multivitamin Liqd Take 30 mLs by mouth daily.   omeprazole 20 MG capsule Commonly known as:  PRILOSEC Take 20 mg by mouth daily.   ondansetron 8 MG disintegrating tablet Commonly known as:  ZOFRAN-ODT Take 1 tablet (8 mg total) by mouth every 8 (eight) hours as needed for nausea.   QUEtiapine 25 MG tablet Commonly known as:  SEROQUEL Take 1 tablet (25 mg total) by mouth at bedtime.    simvastatin 20 MG tablet Commonly known as:  ZOCOR Take 20 mg by mouth every evening.   sodium chloride 1 g tablet Take 1 tablet (1 g total) by mouth 2 (two) times daily with a meal.       Contact information for follow-up providers    Micki Riley, MD. Schedule an appointment as soon as possible for a visit in 4 week(s).   Specialties:  Neurology, Radiology Why:  Subacute infarct Contact information: [address] [place] [address] [phone]            Contact information for after-discharge care    Destination    [facility] Preferred SNF .   Service:  Skilled Nursing Contact information: [address] [place] [address] [phone]                 No Known Allergies  Consultations:  Neurology  Neurosurgery   Procedures/Studies: Ct Angio Head W Or Wo Contrast  Result Date: 10/26/2018 CLINICAL DATA:  Unwitnessed fall, with evidence of acute subarachnoid hemorrhage and subacute RIGHT temporal lobe infarct. History of atrial fibrillation. EXAM:  CT ANGIOGRAPHY HEAD AND NECK TECHNIQUE: Multidetector CT imaging of the head and neck was performed using the standard protocol during bolus administration of intravenous contrast. Multiplanar CT image reconstructions and MIPs were obtained to evaluate the vascular anatomy. Carotid stenosis measurements (when applicable) are obtained utilizing NASCET criteria, using the distal internal carotid diameter as the denominator. CONTRAST:  75mL OMNIPAQUE IOHEXOL 350 MG/ML SOLN COMPARISON:  MR head 10/24/2018. CT head 10/24/2018 FINDINGS: CT HEAD FINDINGS Brain: Expected improvement in bifrontal subarachnoid hemorrhage. Increased prominence of a delayed intracerebral hematoma, LEFT frontal cortex, approximately 10 x 10 x 12 mm. Mild surrounding edema. No midline shift. Elsewhere atrophy with chronic microvascular ischemic change. Hypoattenuation throughout much of the RIGHT  temporal lobe representing subacute infarct. Vascular: Reported separately Skull: Intact. LEFT frontal scalp hematoma. Sinuses: No layering fluid. Orbits: No acute finding. Review of the MIP images confirms the above findings CTA NECK FINDINGS Aortic arch: Standard branching. Imaged portion shows no evidence of aneurysm or dissection. No significant stenosis of the major arch vessel origins. Right carotid system: No evidence of dissection, stenosis (50% or greater) or occlusion. Minor atheromatous calcification. Left carotid system: No evidence of dissection, stenosis (50% or greater) or occlusion. Minor atheromatous calcification. Vertebral arteries: Dominant LEFT vertebral without significant ostial disease. Non dominant RIGHT vertebral patent throughout, with a 50-75% ostial stenosis. No significant stenosis in the neck throughout the V2 or V3 segments. Skeleton: No cervical spine fracture. Multilevel spondylosis. Other neck: No neck masses. Upper chest: No pneumothorax or mass. Review of the MIP images confirms the above findings CTA HEAD FINDINGS Anterior circulation: Nonstenotic calcification of the cavernous and supraclinoid internal carotid arteries bilaterally. No significant intracranial stenosis, occlusion, saccular aneurysm or vascular malformation. Posterior circulation: No significant stenosis, proximal occlusion, aneurysm, or vascular malformation. LEFT vertebral dominant. RIGHT vertebral diminutive after takeoff of the RIGHT PICA. Venous sinuses: As permitted by contrast timing, patent. Anatomic variants: Fetal LEFT PCA, of no significance. Delayed phase: Mild postcontrast enhancement surrounding the delayed intracerebral hematoma, less so around the subacute RIGHT temporal infarct. Review of the MIP images confirms the above findings IMPRESSION: 1. No extracranial or intracranial stenosis of significance. 2. Resolving intracranial subarachnoid hemorrhage. 3. Increasing prominence of a delayed  intracerebral hematoma, LEFT frontal cortex, 10 x 10 x 12 mm. Mild surrounding edema. 4. Subacute RIGHT temporal infarct, stable from priors. Electronically Signed   By: Elsie Stain M.D.   On: 10/26/2018 10:50   Ct Head Wo Contrast  Result Date: 10/27/2018 CLINICAL DATA:  Subarachnoid hemorrhage follow up EXAM: CT HEAD WITHOUT CONTRAST TECHNIQUE: Contiguous axial images were obtained from the base of the skull through the vertex without intravenous contrast. COMPARISON:  Head CT 10/26/2018 FINDINGS: Brain: Small area of parenchymal hematoma at the left frontal cortex is unchanged, measuring 10 x 8 mm. Trace subarachnoid blood is present over the right convexity. No new site of hemorrhage. No midline shift or mass effect. Unchanged appearance of subacute ischemia in the right temporal lobe. Vascular: No abnormal hyperdensity of the major intracranial arteries or dural venous sinuses. No intracranial atherosclerosis. Skull: The visualized skull base, calvarium and extracranial soft tissues are normal. Sinuses/Orbits: No fluid levels or advanced mucosal thickening of the visualized paranasal sinuses. No mastoid or middle ear effusion. The orbits are normal. IMPRESSION: 1. Unchanged intraparenchymal hematoma in the left frontal lobe without mass effect. 2. Near resolution of subarachnoid blood with trace residual focus over the right convexity. Electronically Signed   By: Chrisandra Netters.D.  On: 10/27/2018 01:09   Ct Head Wo Contrast  Addendum Date: 10/24/2018   ADDENDUM REPORT: 10/24/2018 16:33 ADDENDUM: Study discussed by telephone with PA-C JAIME WARD on 10/24/2018 at 16:32 . Electronically Signed   By: Odessa FlemingH  Hall M.D.   On: 10/24/2018 16:33   Result Date: 10/24/2018 CLINICAL DATA:  78 year old female with unwitnessed fall. Found in parking lot. Confused. Blunt trauma. EXAM: CT HEAD WITHOUT CONTRAST CT MAXILLOFACIAL WITHOUT CONTRAST CT CERVICAL SPINE WITHOUT CONTRAST TECHNIQUE: Multidetector CT imaging of  the head, cervical spine, and maxillofacial structures were performed using the standard protocol without intravenous contrast. Multiplanar CT image reconstructions of the cervical spine and maxillofacial structures were also generated. COMPARISON:  None. FINDINGS: CT HEAD FINDINGS Brain: Small volume subarachnoid hemorrhage over the superolateral hemispheres left greater than right (series 5, image 23). No basilar cistern or intraventricular blood. No subdural or other extra-axial blood identified. No intracranial mass effect. No ventriculomegaly. Patchy and confluent bilateral cerebral white matter hypodensity, but abnormal gray matter hypodensity in the right inferior temporal gyrus (series 5, image 12 and coronal image 39. No significant mass effect. The mesial right temporal lobe seems to remain normal. No intra-axial blood identified. Dystrophic basal ganglia calcifications. Vascular: Calcified atherosclerosis at the skull base. No suspicious intracranial vascular hyperdensity. Skull: No calvarium fracture identified. Other: Superficial left periorbital hematoma described below. Other scalp soft tissues are within normal limits. CT MAXILLOFACIAL FINDINGS Osseous: Largely absent dentition. Mandible intact. No maxilla, zygoma, or nasal bone fracture identified. Central skull base appears intact. Orbits: Intact orbital walls. Left superior periorbital superficial scalp hematoma measuring up to 10 millimeters in thickness. Underlying left frontal bone intact. Globes and intraorbital soft tissues appear normal. Sinuses: Well pneumatized. Tympanic cavities and mastoids are clear. Negative nasal cavity aside from leftward septal deviation and trace retained secretions on the right. Soft tissues: Calcified carotid atherosclerosis in the neck. Partially retropharyngeal course of the right carotid. Superimposed postinflammatory calcifications of the palatine tonsils. Negative parapharyngeal spaces, retropharyngeal  space, sublingual space, submandibular, parotid, and masticator spaces. No other superficial soft tissue injury identified. No upper cervical lymphadenopathy. CT CERVICAL SPINE FINDINGS Alignment: Degenerative appearing mild retrolisthesis of C5 on C6 with ankylosis at C4-C5 and also C2-C3. Straightening of lordosis otherwise. Posterior element alignment remains normal. Skull base and vertebrae: Visualized skull base is intact. No atlanto-occipital dissociation. Osteopenia. No acute osseous abnormality identified. Soft tissues and spinal canal: No prevertebral fluid or swelling. No visible canal hematoma. Calcified carotid atherosclerosis in the neck as stated above. Disc levels: Advanced C1-C2 degeneration including partially calcified ligamentous hypertrophy. Ankylosis of C2-C3 posterior elements on the left and C4-C5 vertebral bodies and right posterior elements. Advanced disc and endplate degeneration at C5-C6 with superimposed mild retrolisthesis. Mild to moderate spinal stenosis at that level. Upper chest: Grossly intact visible upper thoracic levels. Negative visible lung apices. IMPRESSION: 1. Nonspecific abnormal hypodensity in the right inferior temporal gyrus. Differential considerations include infarct, infection, encephalomalacia, and less likely tumor related edema. Recommend follow-up MRI brain without and with contrast to further characterize. 2. Superimposed Small volume posttraumatic subarachnoid hemorrhage over the convexities. No intracranial mass effect or ventriculomegaly. 3. No other acute traumatic injury to the brain identified. No fracture of the skull, face, or cervical spine identified. 4. Left periorbital superficial scalp hematoma. 5. 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 Electronically Signed: By: Odessa FlemingH  Hall M.D. On: 10/24/2018 16:28   Ct Angio Neck W Or Wo Contrast  Result  Date: 10/26/2018 CLINICAL DATA:  Unwitnessed  fall, with evidence of acute subarachnoid hemorrhage and subacute RIGHT temporal lobe infarct. History of atrial fibrillation. EXAM: CT ANGIOGRAPHY HEAD AND NECK TECHNIQUE: Multidetector CT imaging of the head and neck was performed using the standard protocol during bolus administration of intravenous contrast. Multiplanar CT image reconstructions and MIPs were obtained to evaluate the vascular anatomy. Carotid stenosis measurements (when applicable) are obtained utilizing NASCET criteria, using the distal internal carotid diameter as the denominator. CONTRAST:  75mL OMNIPAQUE IOHEXOL 350 MG/ML SOLN COMPARISON:  MR head 10/24/2018. CT head 10/24/2018 FINDINGS: CT HEAD FINDINGS Brain: Expected improvement in bifrontal subarachnoid hemorrhage. Increased prominence of a delayed intracerebral hematoma, LEFT frontal cortex, approximately 10 x 10 x 12 mm. Mild surrounding edema. No midline shift. Elsewhere atrophy with chronic microvascular ischemic change. Hypoattenuation throughout much of the RIGHT temporal lobe representing subacute infarct. Vascular: Reported separately Skull: Intact. LEFT frontal scalp hematoma. Sinuses: No layering fluid. Orbits: No acute finding. Review of the MIP images confirms the above findings CTA NECK FINDINGS Aortic arch: Standard branching. Imaged portion shows no evidence of aneurysm or dissection. No significant stenosis of the major arch vessel origins. Right carotid system: No evidence of dissection, stenosis (50% or greater) or occlusion. Minor atheromatous calcification. Left carotid system: No evidence of dissection, stenosis (50% or greater) or occlusion. Minor atheromatous calcification. Vertebral arteries: Dominant LEFT vertebral without significant ostial disease. Non dominant RIGHT vertebral patent throughout, with a 50-75% ostial stenosis. No significant stenosis in the neck throughout the V2 or V3 segments. Skeleton: No cervical spine fracture. Multilevel spondylosis. Other  neck: No neck masses. Upper chest: No pneumothorax or mass. Review of the MIP images confirms the above findings CTA HEAD FINDINGS Anterior circulation: Nonstenotic calcification of the cavernous and supraclinoid internal carotid arteries bilaterally. No significant intracranial stenosis, occlusion, saccular aneurysm or vascular malformation. Posterior circulation: No significant stenosis, proximal occlusion, aneurysm, or vascular malformation. LEFT vertebral dominant. RIGHT vertebral diminutive after takeoff of the RIGHT PICA. Venous sinuses: As permitted by contrast timing, patent. Anatomic variants: Fetal LEFT PCA, of no significance. Delayed phase: Mild postcontrast enhancement surrounding the delayed intracerebral hematoma, less so around the subacute RIGHT temporal infarct. Review of the MIP images confirms the above findings IMPRESSION: 1. No extracranial or intracranial stenosis of significance. 2. Resolving intracranial subarachnoid hemorrhage. 3. Increasing prominence of a delayed intracerebral hematoma, LEFT frontal cortex, 10 x 10 x 12 mm. Mild surrounding edema. 4. Subacute RIGHT temporal infarct, stable from priors. Electronically Signed   By: Elsie Stain M.D.   On: 10/26/2018 10:50   Ct Cervical Spine Wo Contrast  Addendum Date: 10/24/2018   ADDENDUM REPORT: 10/24/2018 16:33 ADDENDUM: Study discussed by telephone with PA-C JAIME WARD on 10/24/2018 at 16:32 . Electronically Signed   By: Odessa Fleming M.D.   On: 10/24/2018 16:33   Result Date: 10/24/2018 CLINICAL DATA:  78 year old female with unwitnessed fall. Found in parking lot. Confused. Blunt trauma. EXAM: CT HEAD WITHOUT CONTRAST CT MAXILLOFACIAL WITHOUT CONTRAST CT CERVICAL SPINE WITHOUT CONTRAST TECHNIQUE: Multidetector CT imaging of the head, cervical spine, and maxillofacial structures were performed using the standard protocol without intravenous contrast. Multiplanar CT image reconstructions of the cervical spine and maxillofacial  structures were also generated. COMPARISON:  None. FINDINGS: CT HEAD FINDINGS Brain: Small volume subarachnoid hemorrhage over the superolateral hemispheres left greater than right (series 5, image 23). No basilar cistern or intraventricular blood. No subdural or other extra-axial blood identified. No intracranial mass effect. No ventriculomegaly.  Patchy and confluent bilateral cerebral white matter hypodensity, but abnormal gray matter hypodensity in the right inferior temporal gyrus (series 5, image 12 and coronal image 39. No significant mass effect. The mesial right temporal lobe seems to remain normal. No intra-axial blood identified. Dystrophic basal ganglia calcifications. Vascular: Calcified atherosclerosis at the skull base. No suspicious intracranial vascular hyperdensity. Skull: No calvarium fracture identified. Other: Superficial left periorbital hematoma described below. Other scalp soft tissues are within normal limits. CT MAXILLOFACIAL FINDINGS Osseous: Largely absent dentition. Mandible intact. No maxilla, zygoma, or nasal bone fracture identified. Central skull base appears intact. Orbits: Intact orbital walls. Left superior periorbital superficial scalp hematoma measuring up to 10 millimeters in thickness. Underlying left frontal bone intact. Globes and intraorbital soft tissues appear normal. Sinuses: Well pneumatized. Tympanic cavities and mastoids are clear. Negative nasal cavity aside from leftward septal deviation and trace retained secretions on the right. Soft tissues: Calcified carotid atherosclerosis in the neck. Partially retropharyngeal course of the right carotid. Superimposed postinflammatory calcifications of the palatine tonsils. Negative parapharyngeal spaces, retropharyngeal space, sublingual space, submandibular, parotid, and masticator spaces. No other superficial soft tissue injury identified. No upper cervical lymphadenopathy. CT CERVICAL SPINE FINDINGS Alignment: Degenerative  appearing mild retrolisthesis of C5 on C6 with ankylosis at C4-C5 and also C2-C3. Straightening of lordosis otherwise. Posterior element alignment remains normal. Skull base and vertebrae: Visualized skull base is intact. No atlanto-occipital dissociation. Osteopenia. No acute osseous abnormality identified. Soft tissues and spinal canal: No prevertebral fluid or swelling. No visible canal hematoma. Calcified carotid atherosclerosis in the neck as stated above. Disc levels: Advanced C1-C2 degeneration including partially calcified ligamentous hypertrophy. Ankylosis of C2-C3 posterior elements on the left and C4-C5 vertebral bodies and right posterior elements. Advanced disc and endplate degeneration at C5-C6 with superimposed mild retrolisthesis. Mild to moderate spinal stenosis at that level. Upper chest: Grossly intact visible upper thoracic levels. Negative visible lung apices. IMPRESSION: 1. Nonspecific abnormal hypodensity in the right inferior temporal gyrus. Differential considerations include infarct, infection, encephalomalacia, and less likely tumor related edema. Recommend follow-up MRI brain without and with contrast to further characterize. 2. Superimposed Small volume posttraumatic subarachnoid hemorrhage over the convexities. No intracranial mass effect or ventriculomegaly. 3. No other acute traumatic injury to the brain identified. No fracture of the skull, face, or cervical spine identified. 4. Left periorbital superficial scalp hematoma. 5. 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 Electronically Signed: By: Odessa Fleming M.D. On: 10/24/2018 16:28   Mr Brain W And Wo Contrast  Result Date: 10/25/2018 CLINICAL DATA:  Fall with confusion.  Abnormal head CT. EXAM: MRI HEAD WITHOUT AND WITH CONTRAST TECHNIQUE: Multiplanar, multiecho pulse sequences of the brain and surrounding structures were obtained without and with intravenous contrast.  CONTRAST:  5 mL Gadavist COMPARISON:  Head CT 10/24/2018 FINDINGS: BRAIN: Trace subarachnoid hemorrhage over both anterior convexities. There is advanced diffuse volume loss with confluent hyperintense T2-weighted signal within the periventricular and deep white matter. Large area of subcortical hyperintense T2-weighted signal within the right temporal lobe with ex vacuo dilatation of the right lateral ventricle atrium and occipital horn, likely indicating encephalomalacia at the site of a remote infarct. Postcontrast images are severely motion degraded. There is mild leptomeningeal contrast enhancement at the right temporal lobe. VASCULAR: The major intracranial arterial and venous sinus flow voids are normal. SKULL AND UPPER CERVICAL SPINE: Calvarial bone marrow signal is normal. There is no skull base mass. Visualized upper cervical spine and  soft tissues are normal. SINUSES/ORBITS: No fluid levels or advanced mucosal thickening. No mastoid or middle ear effusion. The orbits are normal. IMPRESSION: 1. No acute ischemia. 2. Unchanged trace subarachnoid hemorrhage over both convexities in a pattern consistent with trauma. 3. Large area of subcortical hyperintense T2-weighted signal within the right temporal lobe with associated leptomeningeal contrast enhancement. 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 4. Advanced atrophy and severe chronic ischemic white matter changes. 5. Postcontrast imaging severely motion degraded. Electronically Signed   By: Deatra Robinson M.D.   On: 10/25/2018 00:03   Ct Maxillofacial Wo Contrast  Addendum Date: 10/24/2018   ADDENDUM REPORT: 10/24/2018 16:33 ADDENDUM: Study discussed by telephone with PA-C JAIME WARD on 10/24/2018 at 16:32 . Electronically Signed   By: Odessa Fleming M.D.   On: 10/24/2018 16:33   Result Date: 10/24/2018 CLINICAL DATA:  78 year old female with unwitnessed fall. Found in parking lot. Confused. Blunt trauma. EXAM: CT HEAD WITHOUT CONTRAST CT  MAXILLOFACIAL WITHOUT CONTRAST CT CERVICAL SPINE WITHOUT CONTRAST TECHNIQUE: Multidetector CT imaging of the head, cervical spine, and maxillofacial structures were performed using the standard protocol without intravenous contrast. Multiplanar CT image reconstructions of the cervical spine and maxillofacial structures were also generated. COMPARISON:  None. FINDINGS: CT HEAD FINDINGS Brain: Small volume subarachnoid hemorrhage over the superolateral hemispheres left greater than right (series 5, image 23). No basilar cistern or intraventricular blood. No subdural or other extra-axial blood identified. No intracranial mass effect. No ventriculomegaly. Patchy and confluent bilateral cerebral white matter hypodensity, but abnormal gray matter hypodensity in the right inferior temporal gyrus (series 5, image 12 and coronal image 39. No significant mass effect. The mesial right temporal lobe seems to remain normal. No intra-axial blood identified. Dystrophic basal ganglia calcifications. Vascular: Calcified atherosclerosis at the skull base. No suspicious intracranial vascular hyperdensity. Skull: No calvarium fracture identified. Other: Superficial left periorbital hematoma described below. Other scalp soft tissues are within normal limits. CT MAXILLOFACIAL FINDINGS Osseous: Largely absent dentition. Mandible intact. No maxilla, zygoma, or nasal bone fracture identified. Central skull base appears intact. Orbits: Intact orbital walls. Left superior periorbital superficial scalp hematoma measuring up to 10 millimeters in thickness. Underlying left frontal bone intact. Globes and intraorbital soft tissues appear normal. Sinuses: Well pneumatized. Tympanic cavities and mastoids are clear. Negative nasal cavity aside from leftward septal deviation and trace retained secretions on the right. Soft tissues: Calcified carotid atherosclerosis in the neck. Partially retropharyngeal course of the right carotid. Superimposed  postinflammatory calcifications of the palatine tonsils. Negative parapharyngeal spaces, retropharyngeal space, sublingual space, submandibular, parotid, and masticator spaces. No other superficial soft tissue injury identified. No upper cervical lymphadenopathy. CT CERVICAL SPINE FINDINGS Alignment: Degenerative appearing mild retrolisthesis of C5 on C6 with ankylosis at C4-C5 and also C2-C3. Straightening of lordosis otherwise. Posterior element alignment remains normal. Skull base and vertebrae: Visualized skull base is intact. No atlanto-occipital dissociation. Osteopenia. No acute osseous abnormality identified. Soft tissues and spinal canal: No prevertebral fluid or swelling. No visible canal hematoma. Calcified carotid atherosclerosis in the neck as stated above. Disc levels: Advanced C1-C2 degeneration including partially calcified ligamentous hypertrophy. Ankylosis of C2-C3 posterior elements on the left and C4-C5 vertebral bodies and right posterior elements. Advanced disc and endplate degeneration at C5-C6 with superimposed mild retrolisthesis. Mild to moderate spinal stenosis at that level. Upper chest: Grossly intact visible upper thoracic levels. Negative visible lung apices. IMPRESSION: 1. Nonspecific abnormal hypodensity in the right inferior temporal gyrus. Differential considerations include infarct, infection, encephalomalacia,  and less likely tumor related edema. Recommend follow-up MRI brain without and with contrast to further characterize. 2. Superimposed Small volume posttraumatic subarachnoid hemorrhage over the convexities. No intracranial mass effect or ventriculomegaly. 3. No other acute traumatic injury to the brain identified. No fracture of the skull, face, or cervical spine identified. 4. Left periorbital superficial scalp hematoma. 5. 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 Electronically Signed: By: Odessa Fleming M.D. On:  10/24/2018 16:28    10/25/2018: Transthoracic Echocardiogram IMPRESSIONS    1. The left ventricle has normal systolic function, with an ejection fraction of 60-65%. The cavity size was normal. Left ventricular diastolic function could not be evaluated secondary to atrial fibrillation.  2. The right ventricle has normal systolc function. The cavity was normal. There is no increase in right ventricular wall thickness.  3. No evidence of right to left shunting across the atrial septum by bubble contrast study.  4. No stenosis of the aortic valve.  5. The aortic root and ascending aorta are normal in size and structure.   Subjective: No issues overnight.  Discharge Exam: Vitals:   11/01/18 0608 11/01/18 0842  BP: (!) 150/98 (!) 162/92  Pulse: 66 80  Resp: 18 16  Temp: 97.6 F (36.4 C) 97.6 F (36.4 C)  SpO2: 99% 97%   Vitals:   11/01/18 0041 11/01/18 0043 11/01/18 0608 11/01/18 0842  BP: (!) 179/111 (!) 153/101 (!) 150/98 (!) 162/92  Pulse: 92 69 66 80  Resp: Temp: 97.6 F (36.4 C)  97.6 F (36.4 C) 97.6 F (36.4 C)  TempSrc: Oral  Oral Oral  SpO2: 97% 100% 99% 97%  Weight:      Height:        General: not in acute distress    The results of significant diagnostics from this hospitalization (including imaging, microbiology, ancillary and laboratory) are listed below for reference.     Microbiology: Recent Results (from the past 240 hour(s))  SARS Coronavirus 2 (CEPHEID - Performed in [facility] hospital lab), Hosp Order     Status: None   Collection Time: 10/24/18  7:17 PM  Result Value Ref Range Status   SARS Coronavirus 2 NEGATIVE NEGATIVE Final    Comment: (NOTE) If result is NEGATIVE SARS-CoV-2 target nucleic acids are NOT DETECTED. The SARS-CoV-2 RNA is generally detectable in upper and lower  respiratory specimens during the acute phase of infection. The lowest  concentration of SARS-CoV-2 viral copies this assay can detect is 250  copies /  mL. A negative result does not preclude SARS-CoV-2 infection  and should not be used as the sole basis for treatment or other  patient management decisions.  A negative result may occur with  improper specimen collection / handling, submission of specimen other  than nasopharyngeal swab, presence of viral mutation(s) within the  areas targeted by this assay, and inadequate number of viral copies  (<250 copies / mL). A negative result must be combined with clinical  observations, patient history, and epidemiological information. If result is POSITIVE SARS-CoV-2 target nucleic acids are DETECTED. The SARS-CoV-2 RNA is generally detectable in upper and lower  respiratory specimens dur ing the acute phase of infection.  Positive  results are indicative of active infection with SARS-CoV-2.  Clinical  correlation with patient history and other diagnostic information is  necessary to determine patient infection status.  Positive results do  not rule out bacterial infection or co-infection  with other viruses. If result is PRESUMPTIVE POSTIVE SARS-CoV-2 nucleic acids MAY BE PRESENT.   A presumptive positive result was obtained on the submitted specimen  and confirmed on repeat testing.  While 2019 novel coronavirus  (SARS-CoV-2) nucleic acids may be present in the submitted sample  additional confirmatory testing may be necessary for epidemiological  and / or clinical management purposes  to differentiate between  SARS-CoV-2 and other Sarbecovirus currently known to infect humans.  If clinically indicated additional testing with an alternate test  methodology [phone]) is advised. The SARS-CoV-2 RNA is generally  detectable in upper and lower respiratory sp ecimens during the acute  phase of infection. The expected result is Negative. Fact Sheet for Patients:  BoilerBrush.com.cy Fact Sheet for Healthcare Providers: https://pope.com/ This test is  not yet approved or cleared by the [place] FDA and has been authorized for detection and/or diagnosis of SARS-CoV-2 by FDA under an Emergency Use Authorization (EUA).  This EUA will remain in effect (meaning this test can be used) for the duration of the COVID-19 declaration under Section 564(b)(1) of the Act, 21 U.S.C. section 360bbb-3(b)(1), unless the authorization is terminated or revoked sooner. Performed at [facility], [address], [place], [place] [address]      Labs:  Basic Metabolic Panel: Recent Labs  Lab 10/27/18 0351 10/28/18 0343 10/29/18 0348 10/30/18 0454 10/31/18 1047  NA 129* 129* 130* 129* 132*  K 3.6 3.0* 3.4* 2.9* 4.0  CL 95* 93* 93* 94* 96*  CO2 21* GLUCOSE 105* 105* 116* 99 122*  BUN 5* 7* 5* 10 11  CREATININE 0.69 0.56 0.55 0.67 0.51  CALCIUM 8.8* 9.0 8.8* 9.0 9.2  MG 1.9 1.6* 1.9 1.6* 1.5*   CBC: Recent Labs  Lab 10/26/18 0440 10/27/18 0351 10/28/18 0343 10/29/18 0348 10/30/18 0454  WBC 5.8 10.0 5.8 5.8 6.1  HGB 13.2 13.7 12.3 14.8 13.2  HCT 37.4 37.8 34.0* 40.7 36.1  MCV 87.8 86.9 86.3 87.3 86.0  PLT 239 259 256 279 245   CBG: Recent Labs  Lab 10/27/18 1644 10/27/18 2200 10/28/18 0039 10/28/18 0441  GLUCAP 131* 114* 107* 95   Urinalysis    Component Value Date/Time   COLORURINE YELLOW 10/24/2018 1630   APPEARANCEUR CLEAR 10/24/2018 1630   LABSPEC 1.008 10/24/2018 1630   PHURINE 6.0 10/24/2018 1630   GLUCOSEU NEGATIVE 10/24/2018 1630   HGBUR SMALL (A) 10/24/2018 1630   BILIRUBINUR NEGATIVE 10/24/2018 1630   KETONESUR 5 (A) 10/24/2018 1630   PROTEINUR NEGATIVE 10/24/2018 1630   UROBILINOGEN 1.0 09/25/2011 0635   NITRITE NEGATIVE 10/24/2018 1630   LEUKOCYTESUR TRACE (A) 10/24/2018 1630   Microbiology Recent Results (from the past 240 hour(s))  SARS Coronavirus 2 (CEPHEID - Performed in [facility] hospital lab), Hosp Order     Status: None   Collection Time: 10/24/18  7:17 PM  Result Value  Ref Range Status   SARS Coronavirus 2 NEGATIVE NEGATIVE Final    Comment: (NOTE) If result is NEGATIVE SARS-CoV-2 target nucleic acids are NOT DETECTED. The SARS-CoV-2 RNA is generally detectable in upper and lower  respiratory specimens during the acute phase of infection. The lowest  concentration of SARS-CoV-2 viral copies this assay can detect is 250  copies / mL. A negative result does not preclude SARS-CoV-2 infection  and should not be used as the sole basis for treatment or other  patient management decisions.  A negative result may occur with  improper specimen  collection / handling, submission of specimen other  than nasopharyngeal swab, presence of viral mutation(s) within the  areas targeted by this assay, and inadequate number of viral copies  (<250 copies / mL). A negative result must be combined with clinical  observations, patient history, and epidemiological information. If result is POSITIVE SARS-CoV-2 target nucleic acids are DETECTED. The SARS-CoV-2 RNA is generally detectable in upper and lower  respiratory specimens dur ing the acute phase of infection.  Positive  results are indicative of active infection with SARS-CoV-2.  Clinical  correlation with patient history and other diagnostic information is  necessary to determine patient infection status.  Positive results do  not rule out bacterial infection or co-infection with other viruses. If result is PRESUMPTIVE POSTIVE SARS-CoV-2 nucleic acids MAY BE PRESENT.   A presumptive positive result was obtained on the submitted specimen  and confirmed on repeat testing.  While 2019 novel coronavirus  (SARS-CoV-2) nucleic acids may be present in the submitted sample  additional confirmatory testing may be necessary for epidemiological  and / or clinical management purposes  to differentiate between  SARS-CoV-2 and other Sarbecovirus currently known to infect humans.  If clinically indicated additional testing with an  alternate test  methodology [phone]) is advised. The SARS-CoV-2 RNA is generally  detectable in upper and lower respiratory sp ecimens during the acute  phase of infection. The expected result is Negative. Fact Sheet for Patients:  BoilerBrush.com.cy Fact Sheet for Healthcare Providers: https://pope.com/ This test is not yet approved or cleared by the [place] FDA and has been authorized for detection and/or diagnosis of SARS-CoV-2 by FDA under an Emergency Use Authorization (EUA).  This EUA will remain in effect (meaning this test can be used) for the duration of the COVID-19 declaration under Section 564(b)(1) of the Act, 21 U.S.C. section 360bbb-3(b)(1), unless the authorization is terminated or revoked sooner. Performed at [facility], [address], [place], [place] [address]     SIGNED:   Jacquelin Hawking, MD [facility] 11/01/2018, 9:48 AM

## 2018-11-01 NOTE — Discharge Instructions (Signed)
Tammy Navarro,  You were in the hospital because of bleeding around and in your brain. This has stabilized. You also have evidence of a stroke that happened a few weeks ago. You will be discharge with new medications. Please take as prescribed. 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 You will go to a skilled nursing facility for rehab before going home.

## 2018-11-01 NOTE — Care Management Important Message (Signed)
Important Message  Patient Details  Name: Tammy Navarro MRN: [id number] Date of Birth: [birth year]   Medicare Important Message Given:  Yes    Florina Glas Stefan Church 11/01/2018, 3:09 PM

## 2018-11-01 NOTE — TOC Transition Note (Signed)
Transition of Care [facility]) - CM/SW Discharge Note   Patient Details  Name: Tammy Navarro MRN: [id number] Date of Birth: 1941-06-03  Transition of Care [facility]) CM/SW Contact:  Baldemar Lenis, LCSW Phone Number: 11/01/2018, 1:51 PM   Clinical Narrative:   Nurse to call report to [phone].    Final next level of care: Skilled Nursing Facility Barriers to Discharge: No Barriers Identified   Patient Goals and CMS Choice Patient states their goals for this hospitalization and ongoing recovery are:: patient unable to participate in goal setting at this time CMS Medicare.gov Compare Post Acute Care list provided to:: Patient Represenative (must comment) Choice offered to / list presented to : Adult Children  Discharge Placement              Patient chooses bed at: [facility] Patient to be transferred to facility by: [facility] Name of family member notified: Fabiola Backer Patient and family notified of of transfer: 11/01/18  Discharge Plan and Services     Post Acute Care Choice: Skilled Nursing Facility                               Social Determinants of Health (SDOH) Interventions     Readmission Risk Interventions No flowsheet data found.

## 2018-11-02 ENCOUNTER — Encounter: Payer: Self-pay | Admitting: Internal Medicine

## 2018-11-02 ENCOUNTER — Non-Acute Institutional Stay (SKILLED_NURSING_FACILITY): Payer: Medicare Other | Admitting: Internal Medicine

## 2018-11-02 DIAGNOSIS — I6200 Nontraumatic subdural hemorrhage, unspecified: Secondary | ICD-10-CM

## 2018-11-02 DIAGNOSIS — K219 Gastro-esophageal reflux disease without esophagitis: Secondary | ICD-10-CM

## 2018-11-02 DIAGNOSIS — I63019 Cerebral infarction due to thrombosis of unspecified vertebral artery: Secondary | ICD-10-CM

## 2018-11-02 DIAGNOSIS — I609 Nontraumatic subarachnoid hemorrhage, unspecified: Secondary | ICD-10-CM

## 2018-11-02 DIAGNOSIS — I4891 Unspecified atrial fibrillation: Secondary | ICD-10-CM | POA: Diagnosis not present

## 2018-11-02 DIAGNOSIS — G9341 Metabolic encephalopathy: Secondary | ICD-10-CM

## 2018-11-02 DIAGNOSIS — E785 Hyperlipidemia, unspecified: Secondary | ICD-10-CM

## 2018-11-02 DIAGNOSIS — E871 Hypo-osmolality and hyponatremia: Secondary | ICD-10-CM

## 2018-11-02 NOTE — Progress Notes (Signed)
: Provider:  Margit Hanks, MD Location:  [facility] Room Number: 515-P Place of Service:  SNF ([phone])  PCP: Tisovec, Adelfa Koh, MD Patient Care Team: Tisovec, Adelfa Koh, MD as PCP - General (Internal Medicine)  Extended Emergency Contact Information Primary Emergency Contact: Diamond Nickel Mobile Phone: [phone] Relation: Son Secondary Emergency Contact: Lamonte Richer Mobile Phone: [phone] Relation: Son     Allergies: Patient has no known allergies.  Chief Complaint  Patient presents with   New Admit To SNF    New admission to [facility] SNF    HPI: Patient is 78 y.o. female with hypertension, normally takes care of herself without much assistance, who was found today at the food in the [employer] parking lot after sustaining a fall that was not witnessed.  A bystander called 911 and patient was brought to the ED where she was found to have blood all over the left side of her face and scalp.  Patient is awake but has no recollection of what happened.  Patient was found to have a small subarachnoid hemorrhage, and new onset atrial fib, as well as findings on her CT suggestive of possible CVA.  Patient was admitted to [facility] from 5/10-18.  Patient was also found to have a subdural hemorrhage.  Neurosurgery was consulted and the subarachnoid hemorrhage is resolving and the frontal lobe subdural hemorrhage is stable on repeat CT scan, hemoglobin is stable and neurology recommended to start ASA on discharge.  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  Patient did have new onset atrial fib with a chads vas score of 4 but because of fall risk and dementia neurology recommended aspirin and no anticoagulation.  Patient did have acute metabolic encephalopathy with some agitation which improved with Seroquel.  All other issues being stable patient is admitted to skilled  nursing facility for OT/PT.  While at skilled nursing facility patient will be followed for hyperlipidemia treated with Zocor, hypertension treated with atenolol and lisinopril, and GERD treated with Prilosec.  Past Medical History:  Diagnosis Date   CVA (cerebral vascular accident) (HCC)    Hypertension    Hypokalemia    Hyponatremia    Subarachnoid hemorrhage (HCC)     History reviewed. No pertinent surgical history.  Allergies as of 11/02/2018   No Known Allergies     Medication List       Accurate as of Nov 02, 2018  8:32 AM. If you have any questions, ask your nurse or doctor.        amLODipine 5 MG tablet Commonly known as:  NORVASC Take 5 mg by mouth daily.   atenolol 100 MG tablet Commonly known as:  TENORMIN Take 100 mg by mouth daily.   lisinopril 20 MG tablet Commonly known as:  ZESTRIL Take 2 tablets (40 mg total) by mouth daily.   loperamide 2 MG tablet Commonly known as:  Imodium A-D Take 1 tablet (2 mg total) by mouth 4 (four) times daily as needed for diarrhea or loose stools.   multivitamin Liqd Take 30 mLs by mouth daily.   omeprazole 20 MG capsule Commonly known as:  PRILOSEC Take 20 mg by mouth daily.   ondansetron 8 MG disintegrating tablet Commonly known as:  ZOFRAN-ODT Take 1 tablet (8 mg total) by mouth every 8 (eight) hours as needed for nausea.   QUEtiapine 25 MG tablet Commonly  known as:  SEROQUEL Take 1 tablet (25 mg total) by mouth at bedtime.   simvastatin 20 MG tablet Commonly known as:  ZOCOR Take 20 mg by mouth every evening.   sodium chloride 1 g tablet Take 1 tablet (1 g total) by mouth 2 (two) times daily with a meal.       No orders of the defined types were placed in this encounter.   Immunization History  Administered Date(s) Administered   Influenza, High Dose Seasonal PF 03/23/2018   Tdap 10/24/2018    Social History   Tobacco Use   Smoking status: Never Smoker   Smokeless tobacco: Never  Used  Substance Use Topics   Alcohol use: No    Family history is   Family History  Problem Relation Age of Onset   Hypertension Mother    Hypertension Father       Review of Systems  DATA OBTAINED: from patient, nurse GENERAL:  no fevers, fatigue, appetite changes SKIN: No itching, or rash EYES: No eye pain, redness, discharge EARS: No earache, tinnitus, change in hearing NOSE: No congestion, drainage or bleeding  MOUTH/THROAT: No mouth or tooth pain, No sore throat RESPIRATORY: No cough, wheezing, SOB CARDIAC: No chest pain, palpitations, lower extremity edema  GI: No abdominal pain, No N/V/D or constipation, No heartburn or reflux  GU: No dysuria, frequency or urgency, or incontinence  MUSCULOSKELETAL: No unrelieved bone/joint pain NEUROLOGIC: No headache, dizziness or focal weakness PSYCHIATRIC: No c/o anxiety or sadness   Vitals:   11/02/18 0819  BP: 132/81  Pulse: 62  Resp: 20  Temp: 97.8 F (36.6 C)  SpO2: 94%    SpO2 Readings from Last 1 Encounters:  11/02/18 94%   Body mass index is 17.63 kg/m.     Physical Exam  GENERAL APPEARANCE: Alert, conversant,  No acute distress.  SKIN: No diaphoresis rash; sutures to eyebrow HEAD: Normocephalic, atraumatic  EYES: Conjunctiva/lids clear. Pupils round, reactive. EOMs intact.  EARS: External exam WNL, canals clear. Hearing grossly normal.  NOSE: No deformity or discharge.  MOUTH/THROAT: Lips w/o lesions  RESPIRATORY: Breathing is even, unlabored. Lung sounds are clear   CARDIOVASCULAR: Heart RRR no murmurs, rubs or gallops. No peripheral edema.   GASTROINTESTINAL: Abdomen is soft, non-tender, not distended w/ normal bowel sounds. GENITOURINARY: Bladder non tender, not distended  MUSCULOSKELETAL: No abnormal joints or musculature NEUROLOGIC:  Cranial nerves 2-12 grossly intact. Moves all extremities  PSYCHIATRIC: Mood and affect quiet, flat, no behavioral issues  Patient Active Problem List    Diagnosis Date Noted   SAH (subarachnoid hemorrhage) (HCC) 10/24/2018   Fall at home, initial encounter 10/24/2018   HTN (hypertension) 10/24/2018   New onset a-fib (HCC) 10/24/2018   CVA (cerebral vascular accident) (HCC) 10/24/2018   Hyponatremia 10/24/2018   Hypokalemia 10/24/2018      Labs reviewed: Basic Metabolic Panel:    Component Value Date/Time   NA 132 (L) 10/31/2018 1047   K 4.0 10/31/2018 1047   CL 96 (L) 10/31/2018 1047   CO2 24 10/31/2018 1047   GLUCOSE 122 (H) 10/31/2018 1047   BUN 11 10/31/2018 1047   CREATININE 0.51 10/31/2018 1047   CALCIUM 9.2 10/31/2018 1047   PROT 6.0 (L) 10/25/2018 0331   ALBUMIN 3.1 (L) 10/25/2018 0331   AST 26 10/25/2018 0331   ALT 14 10/25/2018 0331   ALKPHOS 57 10/25/2018 0331   BILITOT 1.3 (H) 10/25/2018 0331   GFRNONAA >60 10/31/2018 1047   GFRAA >60 10/31/2018 1047  Recent Labs    10/29/18 0348 10/30/18 0454 10/31/18 1047  NA 130* 129* 132*  K 3.4* 2.9* 4.0  CL 93* 94* 96*  CO2 27 25 24   GLUCOSE 116* 99 122*  BUN 5* 10 11  CREATININE 0.55 0.67 0.51  CALCIUM 8.8* 9.0 9.2  MG 1.9 1.6* 1.5*   Liver Function Tests: Recent Labs    10/24/18 1545 10/25/18 0331  AST 29 26  ALT 16 14  ALKPHOS 70 57  BILITOT 1.1 1.3*  PROT 6.8 6.0*  ALBUMIN 3.7 3.1*   No results for input(s): LIPASE, AMYLASE in the last 8760 hours. No results for input(s): AMMONIA in the last 8760 hours. CBC: Recent Labs    10/24/18 1545  10/28/18 0343 10/29/18 0348 10/30/18 0454  WBC 6.7   < > 5.8 5.8 6.1  NEUTROABS 5.0  --   --   --   --   HGB 14.2   < > 12.3 14.8 13.2  HCT 39.3   < > 34.0* 40.7 36.1  MCV 86.4   < > 86.3 87.3 86.0  PLT 264   < > 256 279 245   < > = values in this interval not displayed.   Lipid Recent Labs    10/26/18 0440  CHOL 135  HDL 55  LDLCALC 72  TRIG 39    Cardiac Enzymes: Recent Labs    10/24/18 1545  TROPONINI <0.03   BNP: No results for input(s): BNP in the last 8760 hours. No  results found for: [facility] Lab Results  Component Value Date   HGBA1C 5.0 10/25/2018   Lab Results  Component Value Date   TSH 2.222 10/26/2018   Lab Results  Component Value Date   VITAMINB12 514 10/26/2018   No results found for: FOLATE No results found for: IRON, TIBC, FERRITIN  Imaging and Procedures obtained prior to SNF admission: Ct Head Wo Contrast  Addendum Date: 10/24/2018   ADDENDUM REPORT: 10/24/2018 16:33 ADDENDUM: Study discussed by telephone with PA-C JAIME WARD on 10/24/2018 at 16:32 . Electronically Signed   By: Odessa Fleming M.D.   On: 10/24/2018 16:33   Result Date: 10/24/2018 CLINICAL DATA:  78 year old female with unwitnessed fall. Found in parking lot. Confused. Blunt trauma. EXAM: CT HEAD WITHOUT CONTRAST CT MAXILLOFACIAL WITHOUT CONTRAST CT CERVICAL SPINE WITHOUT CONTRAST TECHNIQUE: Multidetector CT imaging of the head, cervical spine, and maxillofacial structures were performed using the standard protocol without intravenous contrast. Multiplanar CT image reconstructions of the cervical spine and maxillofacial structures were also generated. COMPARISON:  None. FINDINGS: CT HEAD FINDINGS Brain: Small volume subarachnoid hemorrhage over the superolateral hemispheres left greater than right (series 5, image 23). No basilar cistern or intraventricular blood. No subdural or other extra-axial blood identified. No intracranial mass effect. No ventriculomegaly. Patchy and confluent bilateral cerebral white matter hypodensity, but abnormal gray matter hypodensity in the right inferior temporal gyrus (series 5, image 12 and coronal image 39. No significant mass effect. The mesial right temporal lobe seems to remain normal. No intra-axial blood identified. Dystrophic basal ganglia calcifications. Vascular: Calcified atherosclerosis at the skull base. No suspicious intracranial vascular hyperdensity. Skull: No calvarium fracture identified. Other: Superficial left periorbital hematoma  described below. Other scalp soft tissues are within normal limits. CT MAXILLOFACIAL FINDINGS Osseous: Largely absent dentition. Mandible intact. No maxilla, zygoma, or nasal bone fracture identified. Central skull base appears intact. Orbits: Intact orbital walls. Left superior periorbital superficial scalp hematoma measuring up to 10 millimeters in thickness. Underlying left  frontal bone intact. Globes and intraorbital soft tissues appear normal. Sinuses: Well pneumatized. Tympanic cavities and mastoids are clear. Negative nasal cavity aside from leftward septal deviation and trace retained secretions on the right. Soft tissues: Calcified carotid atherosclerosis in the neck. Partially retropharyngeal course of the right carotid. Superimposed postinflammatory calcifications of the palatine tonsils. Negative parapharyngeal spaces, retropharyngeal space, sublingual space, submandibular, parotid, and masticator spaces. No other superficial soft tissue injury identified. No upper cervical lymphadenopathy. CT CERVICAL SPINE FINDINGS Alignment: Degenerative appearing mild retrolisthesis of C5 on C6 with ankylosis at C4-C5 and also C2-C3. Straightening of lordosis otherwise. Posterior element alignment remains normal. Skull base and vertebrae: Visualized skull base is intact. No atlanto-occipital dissociation. Osteopenia. No acute osseous abnormality identified. Soft tissues and spinal canal: No prevertebral fluid or swelling. No visible canal hematoma. Calcified carotid atherosclerosis in the neck as stated above. Disc levels: Advanced C1-C2 degeneration including partially calcified ligamentous hypertrophy. Ankylosis of C2-C3 posterior elements on the left and C4-C5 vertebral bodies and right posterior elements. Advanced disc and endplate degeneration at C5-C6 with superimposed mild retrolisthesis. Mild to moderate spinal stenosis at that level. Upper chest: Grossly intact visible upper thoracic levels. Negative visible  lung apices. IMPRESSION: 1. Nonspecific abnormal hypodensity in the right inferior temporal gyrus. Differential considerations include infarct, infection, encephalomalacia, and less likely tumor related edema. Recommend follow-up MRI brain without and with contrast to further characterize. 2. Superimposed Small volume posttraumatic subarachnoid hemorrhage over the convexities. No intracranial mass effect or ventriculomegaly. 3. No other acute traumatic injury to the brain identified. No fracture of the skull, face, or cervical spine identified. 4. Left periorbital superficial scalp hematoma. 5. 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 Electronically Signed: By: Odessa Fleming M.D. On: 10/24/2018 16:28   Ct Cervical Spine Wo Contrast  Addendum Date: 10/24/2018   ADDENDUM REPORT: 10/24/2018 16:33 ADDENDUM: Study discussed by telephone with PA-C JAIME WARD on 10/24/2018 at 16:32 . Electronically Signed   By: Odessa Fleming M.D.   On: 10/24/2018 16:33   Result Date: 10/24/2018 CLINICAL DATA:  78 year old female with unwitnessed fall. Found in parking lot. Confused. Blunt trauma. EXAM: CT HEAD WITHOUT CONTRAST CT MAXILLOFACIAL WITHOUT CONTRAST CT CERVICAL SPINE WITHOUT CONTRAST TECHNIQUE: Multidetector CT imaging of the head, cervical spine, and maxillofacial structures were performed using the standard protocol without intravenous contrast. Multiplanar CT image reconstructions of the cervical spine and maxillofacial structures were also generated. COMPARISON:  None. FINDINGS: CT HEAD FINDINGS Brain: Small volume subarachnoid hemorrhage over the superolateral hemispheres left greater than right (series 5, image 23). No basilar cistern or intraventricular blood. No subdural or other extra-axial blood identified. No intracranial mass effect. No ventriculomegaly. Patchy and confluent bilateral cerebral white matter hypodensity, but abnormal gray matter hypodensity in the  right inferior temporal gyrus (series 5, image 12 and coronal image 39. No significant mass effect. The mesial right temporal lobe seems to remain normal. No intra-axial blood identified. Dystrophic basal ganglia calcifications. Vascular: Calcified atherosclerosis at the skull base. No suspicious intracranial vascular hyperdensity. Skull: No calvarium fracture identified. Other: Superficial left periorbital hematoma described below. Other scalp soft tissues are within normal limits. CT MAXILLOFACIAL FINDINGS Osseous: Largely absent dentition. Mandible intact. No maxilla, zygoma, or nasal bone fracture identified. Central skull base appears intact. Orbits: Intact orbital walls. Left superior periorbital superficial scalp hematoma measuring up to 10 millimeters in thickness. Underlying left frontal bone intact. Globes and intraorbital soft tissues appear normal. Sinuses: Well pneumatized.  Tympanic cavities and mastoids are clear. Negative nasal cavity aside from leftward septal deviation and trace retained secretions on the right. Soft tissues: Calcified carotid atherosclerosis in the neck. Partially retropharyngeal course of the right carotid. Superimposed postinflammatory calcifications of the palatine tonsils. Negative parapharyngeal spaces, retropharyngeal space, sublingual space, submandibular, parotid, and masticator spaces. No other superficial soft tissue injury identified. No upper cervical lymphadenopathy. CT CERVICAL SPINE FINDINGS Alignment: Degenerative appearing mild retrolisthesis of C5 on C6 with ankylosis at C4-C5 and also C2-C3. Straightening of lordosis otherwise. Posterior element alignment remains normal. Skull base and vertebrae: Visualized skull base is intact. No atlanto-occipital dissociation. Osteopenia. No acute osseous abnormality identified. Soft tissues and spinal canal: No prevertebral fluid or swelling. No visible canal hematoma. Calcified carotid atherosclerosis in the neck as stated  above. Disc levels: Advanced C1-C2 degeneration including partially calcified ligamentous hypertrophy. Ankylosis of C2-C3 posterior elements on the left and C4-C5 vertebral bodies and right posterior elements. Advanced disc and endplate degeneration at C5-C6 with superimposed mild retrolisthesis. Mild to moderate spinal stenosis at that level. Upper chest: Grossly intact visible upper thoracic levels. Negative visible lung apices. IMPRESSION: 1. Nonspecific abnormal hypodensity in the right inferior temporal gyrus. Differential considerations include infarct, infection, encephalomalacia, and less likely tumor related edema. Recommend follow-up MRI brain without and with contrast to further characterize. 2. Superimposed Small volume posttraumatic subarachnoid hemorrhage over the convexities. No intracranial mass effect or ventriculomegaly. 3. No other acute traumatic injury to the brain identified. No fracture of the skull, face, or cervical spine identified. 4. Left periorbital superficial scalp hematoma. 5. 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 Electronically Signed: By: Odessa FlemingH  Hall M.D. On: 10/24/2018 16:28   Mr Brain W And Wo Contrast  Result Date: 10/25/2018 CLINICAL DATA:  Fall with confusion.  Abnormal head CT. EXAM: MRI HEAD WITHOUT AND WITH CONTRAST TECHNIQUE: Multiplanar, multiecho pulse sequences of the brain and surrounding structures were obtained without and with intravenous contrast. CONTRAST:  5 mL Gadavist COMPARISON:  Head CT 10/24/2018 FINDINGS: BRAIN: Trace subarachnoid hemorrhage over both anterior convexities. There is advanced diffuse volume loss with confluent hyperintense T2-weighted signal within the periventricular and deep white matter. Large area of subcortical hyperintense T2-weighted signal within the right temporal lobe with ex vacuo dilatation of the right lateral ventricle atrium and occipital horn, likely indicating  encephalomalacia at the site of a remote infarct. Postcontrast images are severely motion degraded. There is mild leptomeningeal contrast enhancement at the right temporal lobe. VASCULAR: The major intracranial arterial and venous sinus flow voids are normal. SKULL AND UPPER CERVICAL SPINE: Calvarial bone marrow signal is normal. There is no skull base mass. Visualized upper cervical spine and soft tissues are normal. SINUSES/ORBITS: No fluid levels or advanced mucosal thickening. No mastoid or middle ear effusion. The orbits are normal. IMPRESSION: 1. No acute ischemia. 2. Unchanged trace subarachnoid hemorrhage over both convexities in a pattern consistent with trauma. 3. Large area of subcortical hyperintense T2-weighted signal within the right temporal lobe with associated leptomeningeal contrast enhancement. 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 4. Advanced atrophy and severe chronic ischemic white matter changes. 5. Postcontrast imaging severely motion degraded. Electronically Signed   By: Deatra RobinsonKevin  Herman M.D.   On: 10/25/2018 00:03   Ct Maxillofacial Wo Contrast  Addendum Date: 10/24/2018   ADDENDUM REPORT: 10/24/2018 16:33 ADDENDUM: Study discussed by telephone with PA-C JAIME WARD on 10/24/2018 at 16:32 . Electronically Signed   By: HRexene Edison  Margo Aye M.D.   On: 10/24/2018 16:33   Result Date: 10/24/2018 CLINICAL DATA:  78 year old female with unwitnessed fall. Found in parking lot. Confused. Blunt trauma. EXAM: CT HEAD WITHOUT CONTRAST CT MAXILLOFACIAL WITHOUT CONTRAST CT CERVICAL SPINE WITHOUT CONTRAST TECHNIQUE: Multidetector CT imaging of the head, cervical spine, and maxillofacial structures were performed using the standard protocol without intravenous contrast. Multiplanar CT image reconstructions of the cervical spine and maxillofacial structures were also generated. COMPARISON:  None. FINDINGS: CT HEAD FINDINGS Brain: Small volume subarachnoid hemorrhage over the superolateral  hemispheres left greater than right (series 5, image 23). No basilar cistern or intraventricular blood. No subdural or other extra-axial blood identified. No intracranial mass effect. No ventriculomegaly. Patchy and confluent bilateral cerebral white matter hypodensity, but abnormal gray matter hypodensity in the right inferior temporal gyrus (series 5, image 12 and coronal image 39. No significant mass effect. The mesial right temporal lobe seems to remain normal. No intra-axial blood identified. Dystrophic basal ganglia calcifications. Vascular: Calcified atherosclerosis at the skull base. No suspicious intracranial vascular hyperdensity. Skull: No calvarium fracture identified. Other: Superficial left periorbital hematoma described below. Other scalp soft tissues are within normal limits. CT MAXILLOFACIAL FINDINGS Osseous: Largely absent dentition. Mandible intact. No maxilla, zygoma, or nasal bone fracture identified. Central skull base appears intact. Orbits: Intact orbital walls. Left superior periorbital superficial scalp hematoma measuring up to 10 millimeters in thickness. Underlying left frontal bone intact. Globes and intraorbital soft tissues appear normal. Sinuses: Well pneumatized. Tympanic cavities and mastoids are clear. Negative nasal cavity aside from leftward septal deviation and trace retained secretions on the right. Soft tissues: Calcified carotid atherosclerosis in the neck. Partially retropharyngeal course of the right carotid. Superimposed postinflammatory calcifications of the palatine tonsils. Negative parapharyngeal spaces, retropharyngeal space, sublingual space, submandibular, parotid, and masticator spaces. No other superficial soft tissue injury identified. No upper cervical lymphadenopathy. CT CERVICAL SPINE FINDINGS Alignment: Degenerative appearing mild retrolisthesis of C5 on C6 with ankylosis at C4-C5 and also C2-C3. Straightening of lordosis otherwise. Posterior element  alignment remains normal. Skull base and vertebrae: Visualized skull base is intact. No atlanto-occipital dissociation. Osteopenia. No acute osseous abnormality identified. Soft tissues and spinal canal: No prevertebral fluid or swelling. No visible canal hematoma. Calcified carotid atherosclerosis in the neck as stated above. Disc levels: Advanced C1-C2 degeneration including partially calcified ligamentous hypertrophy. Ankylosis of C2-C3 posterior elements on the left and C4-C5 vertebral bodies and right posterior elements. Advanced disc and endplate degeneration at C5-C6 with superimposed mild retrolisthesis. Mild to moderate spinal stenosis at that level. Upper chest: Grossly intact visible upper thoracic levels. Negative visible lung apices. IMPRESSION: 1. Nonspecific abnormal hypodensity in the right inferior temporal gyrus. Differential considerations include infarct, infection, encephalomalacia, and less likely tumor related edema. Recommend follow-up MRI brain without and with contrast to further characterize. 2. Superimposed Small volume posttraumatic subarachnoid hemorrhage over the convexities. No intracranial mass effect or ventriculomegaly. 3. No other acute traumatic injury to the brain identified. No fracture of the skull, face, or cervical spine identified. 4. Left periorbital superficial scalp hematoma. 5. 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 Electronically Signed: By: Odessa Fleming M.D. On: 10/24/2018 16:28     Not all labs, radiology exams or other studies done during hospitalization come through on my EPIC note; however they are reviewed by me.    Assessment and Plan  Subarachnoid hemorrhage/subdural hemorrhage/subacute CVA- subarachnoid hemorrhage started to resolve in the frontal lobe subdural hemorrhage was stable  on repeat CT scan and mental status was stable.  Neurology was consulted and have recommended aspirin.  There  was no acute stroke on MRI but evidence of a subacute versus chronic infarct on the right temporal lobe SNF- admitted for OT/PT; follow-up with neurology 4 weeks;  Atrial fibrillation-new onset- chads VASC score is 4; however with high fall risk and in setting of dementia neurology recommended at most ASA and no anticoagulation SNF- we will start ASA 81 mg aspirin daily- aspirin was not listed on 1 of patient's discharge medications and will continue Tenormin 100 mg daily  Acute metabolic encephalopathy-EEG negative for seizure activity; several acute strokes seen on image continue to be agitated with attempts to get out of bed per nursing with some improvement with Seroquel; appears to be oriented to self and sometimes place SNF-continue Seroquel 25 mg nightly  Hyperlipidemia SNF- not stated as uncontrolled; continue Zocor milligrams daily  Hypertension SNF- controlled; continue Tenormin 100 mg daily and lisinopril 40 mg daily  GERD SNF- not stated as uncontrolled; continue Prilosec 20 mg p.o. daily  Hyponatremia- thought to be secondary to dehydration and hydrochlorothiazide; a.m. cortisol was checked and was normal; urine studies suggested some salt wasting, which improved with salt tablets SNF- continue sodium chloride 1 g twice daily; will follow-up BMP    Time spent greater than 45 minutes;> 50% of time with patient was spent reviewing records, labs, tests and studies, counseling and developing plan of care  Thurston Hole D. Lyn Hollingshead, MD

## 2018-11-05 ENCOUNTER — Encounter: Payer: Self-pay | Admitting: Internal Medicine

## 2018-11-05 DIAGNOSIS — I6200 Nontraumatic subdural hemorrhage, unspecified: Secondary | ICD-10-CM | POA: Insufficient documentation

## 2018-11-05 DIAGNOSIS — E785 Hyperlipidemia, unspecified: Secondary | ICD-10-CM | POA: Insufficient documentation

## 2018-11-05 DIAGNOSIS — G9341 Metabolic encephalopathy: Secondary | ICD-10-CM | POA: Insufficient documentation

## 2018-11-05 DIAGNOSIS — K219 Gastro-esophageal reflux disease without esophagitis: Secondary | ICD-10-CM | POA: Insufficient documentation

## 2018-11-26 ENCOUNTER — Encounter: Payer: Self-pay | Admitting: Internal Medicine

## 2018-11-26 ENCOUNTER — Non-Acute Institutional Stay (SKILLED_NURSING_FACILITY): Payer: Medicare Other | Admitting: Internal Medicine

## 2018-11-26 DIAGNOSIS — I10 Essential (primary) hypertension: Secondary | ICD-10-CM

## 2018-11-26 DIAGNOSIS — I4891 Unspecified atrial fibrillation: Secondary | ICD-10-CM

## 2018-11-26 DIAGNOSIS — I63019 Cerebral infarction due to thrombosis of unspecified vertebral artery: Secondary | ICD-10-CM

## 2018-11-26 DIAGNOSIS — E876 Hypokalemia: Secondary | ICD-10-CM

## 2018-11-26 DIAGNOSIS — G9341 Metabolic encephalopathy: Secondary | ICD-10-CM

## 2018-11-26 DIAGNOSIS — I609 Nontraumatic subarachnoid hemorrhage, unspecified: Secondary | ICD-10-CM | POA: Diagnosis not present

## 2018-11-26 DIAGNOSIS — I6200 Nontraumatic subdural hemorrhage, unspecified: Secondary | ICD-10-CM

## 2018-11-26 DIAGNOSIS — E871 Hypo-osmolality and hyponatremia: Secondary | ICD-10-CM

## 2018-11-26 DIAGNOSIS — K219 Gastro-esophageal reflux disease without esophagitis: Secondary | ICD-10-CM

## 2018-11-26 DIAGNOSIS — E785 Hyperlipidemia, unspecified: Secondary | ICD-10-CM

## 2018-11-26 NOTE — Progress Notes (Signed)
[facility] Room Number: 515-P Place of Service:  SNF (31)  PCP: Tisovec, Adelfa Kohichard W, MD Patient Care Team: Tisovec, Adelfa Kohichard W, MD as PCP - General (Internal Medicine)  Extended Emergency Contact Information Primary Emergency Contact: Diamond NickelLanning, Ray Mobile Phone: [phone] Relation: Son Secondary Emergency Contact: Lamonte RicherLanning, Bobby Mobile Phone: [phone] Relation: Son  No Known Allergies  Chief Complaint  Patient presents with  . Discharge Note    Discharge Visit    HPI:  78 y.o. female with hypertension, normally takes care of herself without much assistance who is found today on the ground in the [employer] parking lot after sustaining a fall that was not witnessed.  A bystander called 911.  Patient was brought to the ED where she was found to have blood all over the left side of her face and scalp.  Patient was awake but had no recollection of what happened.  Patient was found to have a small subarachnoid hemorrhage and new onset atrial fibrillation as well as findings on her CT suggestive of a possible CVA.  [facility].  Where patient was found to have also a subdural hemorrhage.  Neurosurgery was consulted and subarachnoid hemorrhage is resolving and the frontal lobe subdural hemorrhage is stable on repeat CT scan.  Hemoglobin is stable and neurology recommended start ASA on discharge.  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  Patient did have new onset atrial flutter with a chads vas score of 4 but because of fall risk and dementia neurology recommended aspirin and no anticoagulation.  Patient did have acute metabolic encephalopathy with some agitation which improved with Seroquel.  All other issues being stable patient was admitted to skilled nursing facility for OT/PT.  Patient is stable for discharge, however she chose to  leave the facility before the usual discharge arrangements could be made for her.     Past Medical History:  Diagnosis Date  . CVA (cerebral vascular accident) (HCC)   . Hypertension   . Hypokalemia   . Hyponatremia   . Subarachnoid hemorrhage (HCC)     History reviewed. No pertinent surgical history.   reports that she has never smoked. She has never used smokeless tobacco. She reports that she does not drink alcohol or use drugs. Social History   Socioeconomic History  . Marital status: Married    Spouse name: Not on file  . Number of children: Not on file  . Years of education: Not on file  . Highest education level: Not on file  Occupational History  . Not on file  Social Needs  . Financial resource strain: Not on file  . Food insecurity    Worry: Not on file    Inability: Not on file  . Transportation needs    Medical: Not on file    Non-medical: Not on file  Tobacco Use  . Smoking status: Never Smoker  . Smokeless tobacco: Never Used  Substance and Sexual Activity  . Alcohol use: No  . Drug use: No  . Sexual activity: Never    Birth control/protection: Abstinence  Lifestyle  . Physical activity    Days per week: Not on file    Minutes per session: Not on file  . Stress: Not on file  Relationships  . Social connections    Talks on phone: Not on file  Gets together: Not on file    Attends religious service: Not on file    Active member of club or organization: Not on file    Attends meetings of clubs or organizations: Not on file    Relationship status: Not on file  . Intimate partner violence    Fear of current or ex partner: Not on file    Emotionally abused: Not on file    Physically abused: Not on file    Forced sexual activity: Not on file  Other Topics Concern  . Not on file  Social History Narrative   Reports that she has never smoked. She does not have any smokeless tobacco history on file. She reports that she does not drink alcohol or  use drugs.  Both parents had hypertension.        Pertinent  Health Maintenance Due  Topic Date Due  . PNA vac Low Risk Adult (1 of 2 - PCV13) 12/26/2018 (Originally 01/15/2004)  . INFLUENZA VACCINE  01/15/2019  . DEXA SCAN  Completed    Medications: Allergies as of 11/26/2018   No Known Allergies     Medication List       Accurate as of November 26, 2018 11:59 PM. If you have any questions, ask your nurse or doctor.        amLODipine 5 MG tablet Commonly known as: NORVASC Take 1 tablet (5 mg total) by mouth daily.   aspirin 81 MG chewable tablet Chew 81 mg by mouth daily.   atenolol 100 MG tablet Commonly known as: TENORMIN Take 1 tablet (100 mg total) by mouth daily.   lisinopril 20 MG tablet Commonly known as: ZESTRIL Take 2 tablets (40 mg total) by mouth daily.   loperamide 2 MG tablet Commonly known as: Imodium A-D Take 1 tablet (2 mg total) by mouth 4 (four) times daily as needed for diarrhea or loose stools.   multivitamin Liqd Take 30 mLs by mouth daily.   omeprazole 20 MG capsule Commonly known as: PRILOSEC Take 1 capsule (20 mg total) by mouth daily.   ondansetron 8 MG disintegrating tablet Commonly known as: ZOFRAN-ODT Take 1 tablet (8 mg total) by mouth every 8 (eight) hours as needed for nausea.   QUEtiapine 25 MG tablet Commonly known as: SEROQUEL Take 1 tablet (25 mg total) by mouth at bedtime.   simvastatin 20 MG tablet Commonly known as: ZOCOR Take 1 tablet (20 mg total) by mouth every evening.   sodium chloride 1 g tablet Take 1 tablet (1 g total) by mouth 2 (two) times daily with a meal.        Vitals:   11/26/18 1421  BP: 132/70  Pulse: 74  Resp: 18  Temp: 98 F (36.7 C)  TempSrc: Oral  Weight: 150 lb 3.2 oz (68.1 kg)  Height: 6' (1.829 m)   Body mass index is 20.37 kg/m.  Physical Exam  GENERAL APPEARANCE: Alert, conversant. No acute distress.  HEENT: Unremarkable. RESPIRATORY: Breathing is even, unlabored. Lung  sounds are clear   CARDIOVASCULAR: Heart RRR no murmurs, rubs or gallops. No peripheral edema.  GASTROINTESTINAL: Abdomen is soft, non-tender, not distended w/ normal bowel sounds.  NEUROLOGIC: Cranial nerves 2-12 grossly intact. Moves all extremities   Labs reviewed: Basic Metabolic Panel: Recent Labs    10/29/18 0348 10/30/18 0454 10/31/18 1047  NA 130* 129* 132*  K 3.4* 2.9* 4.0  CL 93* 94* 96*  CO2 27 25 24   GLUCOSE 116* 99 122*  BUN  5* 10 11  CREATININE 0.55 0.67 0.51  CALCIUM 8.8* 9.0 9.2  MG 1.9 1.6* 1.5*   No results found for: [facility] Liver Function Tests: Recent Labs    10/24/18 1545 10/25/18 0331  AST 29 26  ALT 16 14  ALKPHOS 70 57  BILITOT 1.1 1.3*  PROT 6.8 6.0*  ALBUMIN 3.7 3.1*   No results for input(s): LIPASE, AMYLASE in the last 8760 hours. No results for input(s): AMMONIA in the last 8760 hours. CBC: Recent Labs    10/24/18 1545  10/28/18 0343 10/29/18 0348 10/30/18 0454  WBC 6.7   < > 5.8 5.8 6.1  NEUTROABS 5.0  --   --   --   --   HGB 14.2   < > 12.3 14.8 13.2  HCT 39.3   < > 34.0* 40.7 36.1  MCV 86.4   < > 86.3 87.3 86.0  PLT 264   < > 256 279 245   < > = values in this interval not displayed.   Lipid Recent Labs    10/26/18 0440  CHOL 135  HDL 55  LDLCALC 72  TRIG 39   Cardiac Enzymes: Recent Labs    10/24/18 1545  TROPONINI <0.03   BNP: No results for input(s): BNP in the last 8760 hours. CBG: Recent Labs    10/27/18 2200 10/28/18 0039 10/28/18 0441  GLUCAP 114* 107* 95    Procedures and Imaging Studies During Stay: No results found.  Assessment/Plan:    Subarachnoid hemorrhage Subdural hematoma New onset atrial fibrillation CVA Acute metabolic encephalopathy Hypertension Hyponatremia Hypokalemia Hyperlipidemia GERD      Patient is being discharged with the following home health services: OT/PT/ST/nursing/social work-all set up day after patient walked out  Patient is being discharged with  the following durable medical equipment: None  Patient has been advised to f/u with their PCP in 1-2 weeks to bring them up to date on their rehab stay.  Social services at facility was responsible for arranging this appointment.  Pt was provided with a 30 day supply of prescriptions for medications and refills must be obtained from their PCP.  For controlled substances, a more limited supply may be provided adequate until PCP appointment only.    Margit HanksAnne D Abimbola Aki MD

## 2018-11-27 MED ORDER — QUETIAPINE FUMARATE 25 MG PO TABS: 25.0000 mg | ORAL_TABLET | Freq: Every day | ORAL | 0 refills | Status: AC

## 2018-11-27 MED ORDER — OMEPRAZOLE 20 MG PO CPDR: 20.0000 mg | DELAYED_RELEASE_CAPSULE | Freq: Every day | ORAL | 0 refills | Status: DC

## 2018-11-27 MED ORDER — AMLODIPINE BESYLATE 5 MG PO TABS: 5.0000 mg | ORAL_TABLET | Freq: Every day | ORAL | 0 refills | Status: AC

## 2018-11-27 MED ORDER — SIMVASTATIN 20 MG PO TABS: 20.0000 mg | ORAL_TABLET | Freq: Every evening | ORAL | 0 refills | Status: AC

## 2018-11-27 MED ORDER — LISINOPRIL 20 MG PO TABS: 40.0000 mg | ORAL_TABLET | Freq: Every day | ORAL | 0 refills | Status: AC

## 2018-11-27 MED ORDER — SODIUM CHLORIDE 1 G PO TABS: 1.0000 g | ORAL_TABLET | Freq: Two times a day (BID) | ORAL | 0 refills | Status: DC

## 2018-11-27 MED ORDER — ONDANSETRON 8 MG PO TBDP: 8.0000 mg | ORAL_TABLET | Freq: Three times a day (TID) | ORAL | 0 refills | Status: DC | PRN

## 2018-11-27 MED ORDER — ATENOLOL 100 MG PO TABS: 100.0000 mg | ORAL_TABLET | Freq: Every day | ORAL | 0 refills | Status: DC

## 2018-12-31 ENCOUNTER — Other Ambulatory Visit: Payer: Self-pay | Admitting: Internal Medicine

## 2019-01-29 ENCOUNTER — Other Ambulatory Visit: Payer: Self-pay | Admitting: Internal Medicine

## 2019-11-30 ENCOUNTER — Encounter: Payer: Self-pay | Admitting: Internal Medicine

## 2020-03-28 ENCOUNTER — Emergency Department (HOSPITAL_COMMUNITY): Payer: Medicare Other

## 2020-03-28 ENCOUNTER — Inpatient Hospital Stay (HOSPITAL_COMMUNITY)
Admission: EM | Admit: 2020-03-28 | Discharge: 2020-04-04 | DRG: 312 | Disposition: A | Discharge status: Skilled Nursing Facility | Payer: Medicare Other | Attending: Internal Medicine | Admitting: Internal Medicine

## 2020-03-28 ENCOUNTER — Other Ambulatory Visit: Payer: Self-pay

## 2020-03-28 DIAGNOSIS — F0390 Unspecified dementia without behavioral disturbance: Secondary | ICD-10-CM | POA: Diagnosis present

## 2020-03-28 DIAGNOSIS — I252 Old myocardial infarction: Secondary | ICD-10-CM

## 2020-03-28 DIAGNOSIS — I10 Essential (primary) hypertension: Secondary | ICD-10-CM | POA: Diagnosis present

## 2020-03-28 DIAGNOSIS — I2510 Atherosclerotic heart disease of native coronary artery without angina pectoris: Secondary | ICD-10-CM | POA: Diagnosis present

## 2020-03-28 DIAGNOSIS — I482 Chronic atrial fibrillation, unspecified: Secondary | ICD-10-CM

## 2020-03-28 DIAGNOSIS — J479 Bronchiectasis, uncomplicated: Secondary | ICD-10-CM | POA: Diagnosis present

## 2020-03-28 DIAGNOSIS — Z79899 Other long term (current) drug therapy: Secondary | ICD-10-CM

## 2020-03-28 DIAGNOSIS — I714 Abdominal aortic aneurysm, without rupture: Secondary | ICD-10-CM | POA: Diagnosis present

## 2020-03-28 DIAGNOSIS — K219 Gastro-esophageal reflux disease without esophagitis: Secondary | ICD-10-CM | POA: Diagnosis present

## 2020-03-28 DIAGNOSIS — E876 Hypokalemia: Secondary | ICD-10-CM | POA: Diagnosis not present

## 2020-03-28 DIAGNOSIS — J219 Acute bronchiolitis, unspecified: Secondary | ICD-10-CM | POA: Diagnosis present

## 2020-03-28 DIAGNOSIS — Z20822 Contact with and (suspected) exposure to covid-19: Secondary | ICD-10-CM | POA: Diagnosis present

## 2020-03-28 DIAGNOSIS — R55 Syncope and collapse: Secondary | ICD-10-CM | POA: Diagnosis not present

## 2020-03-28 DIAGNOSIS — I700 Atherosclerosis of aorta: Secondary | ICD-10-CM | POA: Diagnosis present

## 2020-03-28 DIAGNOSIS — N390 Urinary tract infection, site not specified: Secondary | ICD-10-CM | POA: Diagnosis present

## 2020-03-28 DIAGNOSIS — Z7982 Long term (current) use of aspirin: Secondary | ICD-10-CM

## 2020-03-28 DIAGNOSIS — Z8249 Family history of ischemic heart disease and other diseases of the circulatory system: Secondary | ICD-10-CM

## 2020-03-28 DIAGNOSIS — Z87891 Personal history of nicotine dependence: Secondary | ICD-10-CM

## 2020-03-28 DIAGNOSIS — R0902 Hypoxemia: Secondary | ICD-10-CM | POA: Diagnosis not present

## 2020-03-28 DIAGNOSIS — B888 Other specified infestations: Secondary | ICD-10-CM | POA: Diagnosis present

## 2020-03-28 DIAGNOSIS — R54 Age-related physical debility: Secondary | ICD-10-CM | POA: Diagnosis present

## 2020-03-28 DIAGNOSIS — E860 Dehydration: Secondary | ICD-10-CM | POA: Diagnosis present

## 2020-03-28 DIAGNOSIS — Z8673 Personal history of transient ischemic attack (TIA), and cerebral infarction without residual deficits: Secondary | ICD-10-CM

## 2020-03-28 DIAGNOSIS — B961 Klebsiella pneumoniae [K. pneumoniae] as the cause of diseases classified elsewhere: Secondary | ICD-10-CM | POA: Diagnosis present

## 2020-03-28 HISTORY — DX: Atherosclerotic heart disease of native coronary artery without angina pectoris: I25.10

## 2020-03-28 HISTORY — DX: Cardiac arrhythmia, unspecified: I49.9

## 2020-03-28 HISTORY — DX: Acute myocardial infarction, unspecified: I21.9

## 2020-03-28 LAB — BASIC METABOLIC PANEL
Anion gap: 12 (ref 5–15)
BUN: 24 mg/dL — ABNORMAL HIGH (ref 8–23)
CO2: 23 mmol/L (ref 22–32)
Calcium: 9.2 mg/dL (ref 8.9–10.3)
Chloride: 106 mmol/L (ref 98–111)
Creatinine, Ser: 0.84 mg/dL (ref 0.44–1.00)
GFR, Estimated: >60 mL/min (ref >60)
Glucose, Bld: 128 mg/dL — ABNORMAL HIGH (ref 70–99)
Potassium: 3.3 mmol/L — ABNORMAL LOW (ref 3.5–5.1)
Sodium: 141 mmol/L (ref 135–145)

## 2020-03-28 LAB — CBC
HCT: 37.9 % (ref 36.0–46.0)
Hemoglobin: 11.9 g/dL — ABNORMAL LOW (ref 12.0–15.0)
MCH: 29.8 pg (ref 26.0–34.0)
MCHC: 31.4 g/dL (ref 30.0–36.0)
MCV: 95 fL (ref 80.0–100.0)
Platelets: 240 10*3/uL (ref 150–400)
RBC: 3.99 MIL/uL (ref 3.87–5.11)
RDW: 13 % (ref 11.5–15.5)
WBC: 5 10*3/uL (ref 4.0–10.5)
nRBC: 0 % (ref 0.0–0.2)

## 2020-03-28 LAB — URINALYSIS, ROUTINE W REFLEX MICROSCOPIC
Bilirubin Urine: NEGATIVE
Glucose, UA: NEGATIVE mg/dL
Hgb urine dipstick: NEGATIVE
Ketones, ur: 5 mg/dL — AB
Nitrite: NEGATIVE
Protein, ur: 30 mg/dL — AB
Specific Gravity, Urine: 1.02 (ref 1.005–1.030)
pH: 7 (ref 5.0–8.0)

## 2020-03-28 LAB — CBG MONITORING, ED: Glucose-Capillary: 128 mg/dL — ABNORMAL HIGH (ref 70–99)

## 2020-03-28 NOTE — ED Triage Notes (Signed)
Pt here via EMS for eval of brief syncopal episode, fell backwards onto the curb while waiting for a taxi at [employer]. Denies pain or injury. Pt in afib with EMS, denies hx of same. Denies blood thinners.

## 2020-03-29 ENCOUNTER — Observation Stay (HOSPITAL_COMMUNITY): Payer: Medicare Other

## 2020-03-29 ENCOUNTER — Encounter (HOSPITAL_COMMUNITY): Payer: Self-pay | Admitting: Family Medicine

## 2020-03-29 ENCOUNTER — Emergency Department (HOSPITAL_COMMUNITY): Payer: Medicare Other

## 2020-03-29 DIAGNOSIS — J189 Pneumonia, unspecified organism: Secondary | ICD-10-CM

## 2020-03-29 DIAGNOSIS — I482 Chronic atrial fibrillation, unspecified: Secondary | ICD-10-CM

## 2020-03-29 DIAGNOSIS — I10 Essential (primary) hypertension: Secondary | ICD-10-CM

## 2020-03-29 DIAGNOSIS — B888 Other specified infestations: Secondary | ICD-10-CM

## 2020-03-29 DIAGNOSIS — E876 Hypokalemia: Secondary | ICD-10-CM

## 2020-03-29 DIAGNOSIS — R55 Syncope and collapse: Secondary | ICD-10-CM

## 2020-03-29 LAB — RESPIRATORY PANEL BY RT PCR (FLU A&B, COVID)
Influenza A by PCR: NEGATIVE
Influenza B by PCR: NEGATIVE
SARS Coronavirus 2 by RT PCR: NEGATIVE

## 2020-03-29 LAB — BASIC METABOLIC PANEL
Anion gap: 9 (ref 5–15)
BUN: 15 mg/dL (ref 8–23)
CO2: 26 mmol/L (ref 22–32)
Calcium: 9.1 mg/dL (ref 8.9–10.3)
Chloride: 104 mmol/L (ref 98–111)
Creatinine, Ser: 0.55 mg/dL (ref 0.44–1.00)
GFR, Estimated: >60 mL/min (ref >60)
Glucose, Bld: 139 mg/dL — ABNORMAL HIGH (ref 70–99)
Potassium: 3.5 mmol/L (ref 3.5–5.1)
Sodium: 139 mmol/L (ref 135–145)

## 2020-03-29 LAB — STREP PNEUMONIAE URINARY ANTIGEN: Strep Pneumo Urinary Antigen: NEGATIVE

## 2020-03-29 LAB — TROPONIN I (HIGH SENSITIVITY)
Troponin I (High Sensitivity): 9 ng/L (ref <18)
Troponin I (High Sensitivity): 8 ng/L (ref <18)
Troponin I (High Sensitivity): 7 ng/L (ref <18)

## 2020-03-29 LAB — MAGNESIUM: Magnesium: 1.9 mg/dL (ref 1.7–2.4)

## 2020-03-29 LAB — HIV ANTIBODY (ROUTINE TESTING W REFLEX): HIV Screen 4th Generation wRfx: NONREACTIVE

## 2020-03-29 MED ORDER — ATENOLOL 100 MG PO TABS
100.0000 mg | ORAL_TABLET | Freq: Every day | ORAL | Status: DC
  Administered 2020-03-29: 100 mg via ORAL
  Filled 2020-03-29: qty 2

## 2020-03-29 MED ORDER — LISINOPRIL 40 MG PO TABS
40.0000 mg | ORAL_TABLET | Freq: Every day | ORAL | Status: DC
  Administered 2020-03-29 – 2020-04-04 (×7): 40 mg via ORAL
  Filled 2020-03-29 (×5): qty 1
  Filled 2020-03-29: qty 2
  Filled 2020-03-29: qty 1

## 2020-03-29 MED ORDER — ONDANSETRON HCL 4 MG PO TABS: 4.0000 mg | ORAL_TABLET | Freq: Four times a day (QID) | ORAL | Status: DC | PRN

## 2020-03-29 MED ORDER — POTASSIUM CHLORIDE CRYS ER 20 MEQ PO TBCR
40.0000 meq | EXTENDED_RELEASE_TABLET | Freq: Once | ORAL | Status: AC
  Administered 2020-03-29: 40 meq via ORAL
  Filled 2020-03-29: qty 2

## 2020-03-29 MED ORDER — ONDANSETRON HCL 4 MG/2ML IJ SOLN: 4.0000 mg | Freq: Four times a day (QID) | INTRAMUSCULAR | Status: DC | PRN

## 2020-03-29 MED ORDER — ACETAMINOPHEN 325 MG PO TABS
650.0000 mg | ORAL_TABLET | Freq: Four times a day (QID) | ORAL | Status: DC | PRN
  Administered 2020-03-31: 650 mg via ORAL
  Filled 2020-03-29: qty 2

## 2020-03-29 MED ORDER — SODIUM CHLORIDE 0.9 % IV SOLN
2.0000 g | INTRAVENOUS | Status: AC
  Administered 2020-03-29 – 2020-04-01 (×4): 2 g via INTRAVENOUS
  Filled 2020-03-29 (×5): qty 20

## 2020-03-29 MED ORDER — SIMVASTATIN 20 MG PO TABS
20.0000 mg | ORAL_TABLET | Freq: Every evening | ORAL | Status: DC
  Administered 2020-03-29 – 2020-04-03 (×6): 20 mg via ORAL
  Filled 2020-03-29 (×5): qty 1

## 2020-03-29 MED ORDER — LACTATED RINGERS IV BOLUS
1000.0000 mL | Freq: Once | INTRAVENOUS | Status: AC
  Administered 2020-03-29: 1000 mL via INTRAVENOUS

## 2020-03-29 MED ORDER — AMLODIPINE BESYLATE 5 MG PO TABS
5.0000 mg | ORAL_TABLET | Freq: Every day | ORAL | Status: DC
  Administered 2020-03-29 – 2020-04-04 (×7): 5 mg via ORAL
  Filled 2020-03-29 (×7): qty 1

## 2020-03-29 MED ORDER — SODIUM CHLORIDE 0.9 % IV SOLN
INTRAVENOUS | Status: DC | PRN
  Administered 2020-03-29 – 2020-04-01 (×5): 250 mL via INTRAVENOUS

## 2020-03-29 MED ORDER — ACETAMINOPHEN 650 MG RE SUPP: 650.0000 mg | Freq: Four times a day (QID) | RECTAL | Status: DC | PRN

## 2020-03-29 MED ORDER — SODIUM CHLORIDE 0.9 % IV SOLN
500.0000 mg | INTRAVENOUS | Status: AC
  Administered 2020-03-29 – 2020-04-01 (×4): 500 mg via INTRAVENOUS
  Filled 2020-03-29 (×4): qty 500

## 2020-03-29 MED ORDER — POTASSIUM CHLORIDE CRYS ER 20 MEQ PO TBCR
20.0000 meq | EXTENDED_RELEASE_TABLET | Freq: Once | ORAL | Status: AC
  Administered 2020-03-29: 20 meq via ORAL
  Filled 2020-03-29: qty 1

## 2020-03-29 MED ORDER — IOHEXOL 350 MG/ML SOLN
75.0000 mL | Freq: Once | INTRAVENOUS | Status: AC | PRN
  Administered 2020-03-29: 75 mL via INTRAVENOUS

## 2020-03-29 MED ORDER — SODIUM CHLORIDE 0.9% FLUSH
3.0000 mL | Freq: Two times a day (BID) | INTRAVENOUS | Status: DC
  Administered 2020-03-29 – 2020-04-04 (×13): 3 mL via INTRAVENOUS

## 2020-03-29 MED ORDER — ASPIRIN 81 MG PO CHEW
81.0000 mg | CHEWABLE_TABLET | Freq: Every day | ORAL | Status: DC
  Administered 2020-03-29 – 2020-04-04 (×7): 81 mg via ORAL
  Filled 2020-03-29 (×7): qty 1

## 2020-03-29 NOTE — H&P (Signed)
History and Physical    Tammy BoardsLoretta J Pinder GNF:[id number] DOB: 04/01/1941 DOA: 03/28/2020  PCP: [name], [name], MD   Patient coming from:  Home  Chief Complaint:  Syncopal event  HPI: Tammy Navarro is a 79 y.o. female with medical history significant for hypertension, stroke, subdural hematoma, subarachnoid hemorrhage, atrial fibrillation and comes in following a syncopal episode.  Apparently, she fell backward while waiting for a taxi.  She denies any injury and actually does not remember having a syncopal episode. She was with her son at the time of the event. She states she did not have any palpitations, chest pain or warning that she was going to pass out. She states she has not had any recent head injury or trauma. She denies chest pain, heaviness, tightness, pressure.  She denies dizziness or lightheadedness.  She denies palpitations.  She is not on any anticoagulants.  She has not been vaccinated against COVID-19. He states she has not had any recent illnesses and has had no known exposures to COVID-19. At this time she states that she feels fine.  ED Course: Patient was found to have atrial fibrillation on EKG and a mildly decreased potassium level on labs. CT of her head and neck showed no acute abnormalities. It was noted that she had some bilateral opacities in her upper lungs on the neck CT which could be better visualized with a dedicated chest CT. CT angiography of her chest has been ordered in the emergency room and is pending. Patient was found to have bedbugs while in the emergency room and was placed in contact precautions.  Review of Systems:  General: Denies weakness, fever, chills, weight loss, night sweats.  Denies dizziness.  Denies change in appetite HENT: Denies head trauma, headache, denies change in hearing, tinnitus.  Denies nasal congestion or bleeding.  Denies sore throat, sores in mouth.  Denies difficulty swallowing Eyes: Denies blurry vision, pain in eye,  drainage.  Denies discoloration of eyes. Neck: Denies pain.  Denies swelling.  Denies pain with movement. Cardiovascular: Denies chest pain, palpitations.  Denies edema.  Denies orthopnea Respiratory: Denies shortness of breath, cough.  Denies wheezing.  Denies sputum production Gastrointestinal: Denies abdominal pain, swelling.  Denies nausea, vomiting, diarrhea.  Denies melena.  Denies hematemesis. Musculoskeletal: Denies limitation of movement.  Denies deformity or swelling.  Denies pain.  Denies arthralgias or myalgias. Genitourinary: Denies pelvic pain.  Denies urinary frequency or hesitancy.  Denies dysuria.  Skin: Denies rash.  Denies petechiae, purpura, ecchymosis. Neurological: Denies headache. Denies seizure activity.  Denies weakness or paresthesia.  Denies slurred speech, drooping face.  Denies visual change. Psychiatric: Denies depression, anxiety.  Denies suicidal thoughts or ideation.  Denies hallucinations.  Past Medical History:  Diagnosis Date  . Coronary artery disease   . CVA (cerebral vascular accident) (HCC)   . Dysrhythmia   . Hypertension   . Hypokalemia   . Hyponatremia   . Myocardial infarction (HCC)   . Subarachnoid hemorrhage (HCC)     History reviewed. No pertinent surgical history.  Social History  reports that she has quit smoking. She has never used smokeless tobacco. She reports that she does not drink alcohol and does not use drugs.  No Known Allergies  Family History  Problem Relation Age of Onset  . Hypertension Mother   . Hypertension Father      Prior to Admission medications   Medication Sig Start Date End Date Taking? Authorizing Provider  amLODipine (NORVASC) 5 MG tablet Take  1 tablet (5 mg total) by mouth daily. 11/27/18   Margit Hanks, MD  aspirin 81 MG chewable tablet Chew 81 mg by mouth daily.    [provider]  atenolol (TENORMIN) 100 MG tablet Take 1 tablet (100 mg total) by mouth daily. 11/27/18   Margit Hanks,  MD  lisinopril (ZESTRIL) 20 MG tablet Take 2 tablets (40 mg total) by mouth daily. 11/27/18   Margit Hanks, MD  loperamide (IMODIUM A-D) 2 MG tablet Take 1 tablet (2 mg total) by mouth 4 (four) times daily as needed for diarrhea or loose stools. 11/06/12   Carmelina Dane, MD  multivitamin (GERI-TONIC) LIQD Take 30 mLs by mouth daily.    [provider]  omeprazole (PRILOSEC) 20 MG capsule Take 1 capsule (20 mg total) by mouth daily. 11/27/18   Margit Hanks, MD  ondansetron (ZOFRAN-ODT) 8 MG disintegrating tablet Take 1 tablet (8 mg total) by mouth every 8 (eight) hours as needed for nausea. 11/27/18   Margit Hanks, MD  QUEtiapine (SEROQUEL) 25 MG tablet Take 1 tablet (25 mg total) by mouth at bedtime. 11/27/18   Margit Hanks, MD  simvastatin (ZOCOR) 20 MG tablet Take 1 tablet (20 mg total) by mouth every evening. 11/27/18   Margit Hanks, MD  sodium chloride 1 g tablet Take 1 tablet (1 g total) by mouth 2 (two) times daily with a meal. 11/27/18   Margit Hanks, MD    Physical Exam: Vitals:   03/28/20 2138 03/29/20 0034 03/29/20 0301 03/29/20 0346  BP: (!) 169/112 (!) 140/98 (!) 177/89   Pulse: 93 70 76 71  Resp: 20 18 16    Temp: 97.6 F (36.4 C)  97.6 F (36.4 C)   TempSrc:   Oral   SpO2: 99% 97% 98%     Constitutional: NAD, calm, comfortable Vitals:   03/28/20 2138 03/29/20 0034 03/29/20 0301 03/29/20 0346  BP: (!) 169/112 (!) 140/98 (!) 177/89   Pulse: 93 70 76 71  Resp: 20 18 16    Temp: 97.6 F (36.4 C)  97.6 F (36.4 C)   TempSrc:   Oral   SpO2: 99% 97% 98%    General: WDWN, Alert and oriented x3.  Eyes: EOMI, PERRL, lids and conjunctivae normal.  Sclera nonicteric HENT:  [place]/AT, external ears normal.  Nares patent without epistasis. Mucous membranes are moist. Posterior pharynx clear of any exudate or lesions. Dentures in place.  Neck: Soft, normal range of motion, supple, no masses, no thyromegaly.  Trachea midline Respiratory: clear to  auscultation bilaterally, no wheezing, no crackles. Normal respiratory effort. No accessory muscle use.  Cardiovascular:  Irregularly Irregular rhythm. Normal rate.  no murmurs / rubs / gallops. No extremity edema. 2+ pedal pulses. No carotid bruits.  Abdomen: Soft, no tenderness, nondistended, no rebound or guarding.  No masses palpated. No hepatosplenomegaly. Bowel sounds normoactive Musculoskeletal: FROM. no clubbing / cyanosis. No joint deformity upper and lower extremities. no contractures. Normal muscle tone.  Skin: Warm, dry, intact no rashes, lesions, ulcers. No induration Neurologic: CN 2-12 grossly intact.  Normal speech.  Sensation intact, patella DTR +1 bilaterally. Strength 4/5 in all extremities.   Psychiatric: Normal judgment and insight.  Normal mood.    Labs on Admission: I have personally reviewed following labs and imaging studies  CBC: Recent Labs  Lab 03/28/20 1641  WBC 5.0  HGB 11.9*  HCT 37.9  MCV 95.0  PLT 240    Basic Metabolic Panel:  Recent Labs  Lab 03/28/20 1641  NA 141  K 3.3*  CL 106  CO2 23  GLUCOSE 128*  BUN 24*  CREATININE 0.84  CALCIUM 9.2    GFR: CrCl cannot be calculated (Unknown ideal weight.).  Liver Function Tests: No results for input(s): AST, ALT, ALKPHOS, BILITOT, PROT, ALBUMIN in the last 168 hours.  Urine analysis:    Component Value Date/Time   COLORURINE AMBER (A) 03/28/2020 2118   APPEARANCEUR CLOUDY (A) 03/28/2020 2118   LABSPEC 1.020 03/28/2020 2118   PHURINE 7.0 03/28/2020 2118   GLUCOSEU NEGATIVE 03/28/2020 2118   HGBUR NEGATIVE 03/28/2020 2118   BILIRUBINUR NEGATIVE 03/28/2020 2118   KETONESUR 5 (A) 03/28/2020 2118   PROTEINUR 30 (A) 03/28/2020 2118   UROBILINOGEN 1.0 09/25/2011 0635   NITRITE NEGATIVE 03/28/2020 2118   LEUKOCYTESUR MODERATE (A) 03/28/2020 2118    Radiological Exams on Admission: DG Chest 2 View  Result Date: 03/29/2020 CLINICAL DATA:  Syncope EXAM: CHEST - 2 VIEW COMPARISON:   10/06/2008 FINDINGS: The lungs are well expanded and are symmetric. A focal pulmonary opacity is seen within the left mid lung zone possibly representing a small pulmonary nodule or nodular infiltrate. This is new since prior examination. Lungs are otherwise clear. No pneumothorax or pleural effusion. Cardiac size within normal limits. Pulmonary vascularity is normal. IMPRESSION: No radiographic evidence of acute cardiopulmonary disease. Focal pulmonary opacity within the left mid lung zone, new since prior examination. This could be better assessed with dedicated CT imaging. Alternatively, a follow-up chest radiograph could be obtained in 3-6 months to document stability. Electronically Signed   By: Helyn Numbers MD   On: 03/29/2020 03:50   CT HEAD WO CONTRAST  Result Date: 03/28/2020 CLINICAL DATA:  79 year old female with head trauma. EXAM: CT HEAD WITHOUT CONTRAST CT CERVICAL SPINE WITHOUT CONTRAST TECHNIQUE: Multidetector CT imaging of the head and cervical spine was performed following the standard protocol without intravenous contrast. Multiplanar CT image reconstructions of the cervical spine were also generated. COMPARISON:  CT dated 10/27/2018. FINDINGS: CT HEAD FINDINGS Brain: Moderate age-related atrophy and chronic microvascular ischemic changes. There is no acute intracranial hemorrhage. No mass effect or midline shift. No extra-axial fluid collection. Vascular: No hyperdense vessel or unexpected calcification. Skull: Normal. Negative for fracture or focal lesion. Sinuses/Orbits: No acute finding. Other: None CT CERVICAL SPINE FINDINGS Alignment: No acute subluxation. Grade 1 C5-C6 retrolisthesis. Skull base and vertebrae: No acute fracture. Osteopenia. Soft tissues and spinal canal: No prevertebral fluid or swelling. No visible canal hematoma. Disc levels: Multilevel degenerative changes with endplate irregularity and disc space narrowing most prominent at C5-C6. Upper chest: Bilateral clusters  of ground-glass nodular densities most consistent with pneumonia or aspiration. Clinical correlation is recommended. Other: Bilateral carotid bulb calcified plaques. IMPRESSION: 1. No acute intracranial pathology. Moderate age-related atrophy and chronic microvascular ischemic changes. 2. No acute/traumatic cervical spine pathology. Multilevel degenerative changes. 3. Bilateral clusters of ground-glass nodular densities most consistent with pneumonia or aspiration. Clinical correlation is recommended. Electronically Signed   By: Elgie Collard M.D.   On: 03/28/2020 18:44   CT Cervical Spine Wo Contrast  Result Date: 03/28/2020 CLINICAL DATA:  79 year old female with head trauma. EXAM: CT HEAD WITHOUT CONTRAST CT CERVICAL SPINE WITHOUT CONTRAST TECHNIQUE: Multidetector CT imaging of the head and cervical spine was performed following the standard protocol without intravenous contrast. Multiplanar CT image reconstructions of the cervical spine were also generated. COMPARISON:  CT dated 10/27/2018. FINDINGS: CT HEAD FINDINGS Brain: Moderate age-related atrophy  and chronic microvascular ischemic changes. There is no acute intracranial hemorrhage. No mass effect or midline shift. No extra-axial fluid collection. Vascular: No hyperdense vessel or unexpected calcification. Skull: Normal. Negative for fracture or focal lesion. Sinuses/Orbits: No acute finding. Other: None CT CERVICAL SPINE FINDINGS Alignment: No acute subluxation. Grade 1 C5-C6 retrolisthesis. Skull base and vertebrae: No acute fracture. Osteopenia. Soft tissues and spinal canal: No prevertebral fluid or swelling. No visible canal hematoma. Disc levels: Multilevel degenerative changes with endplate irregularity and disc space narrowing most prominent at C5-C6. Upper chest: Bilateral clusters of ground-glass nodular densities most consistent with pneumonia or aspiration. Clinical correlation is recommended. Other: Bilateral carotid bulb calcified  plaques. IMPRESSION: 1. No acute intracranial pathology. Moderate age-related atrophy and chronic microvascular ischemic changes. 2. No acute/traumatic cervical spine pathology. Multilevel degenerative changes. 3. Bilateral clusters of ground-glass nodular densities most consistent with pneumonia or aspiration. Clinical correlation is recommended. Electronically Signed   By: Elgie Collard M.D.   On: 03/28/2020 18:44    EKG: Independently reviewed. EKG shows atrial fibrillation with no acute ST elevation or depression. QTc is normal at 442  Assessment/Plan Principal Problem:   Syncope Ms. Smitherman will be observed on medical telemetry.  CT angiography was ordered in the emergency room to check for PE as etiology of syncope. 8 to 10% of PEs will present with syncopal episode. Patient has had no chest pain or palpitations before the event or since the event. We will continue to monitor on telemetry for any arrhythmia Patient also had abnormality of nodular densities on chest x-ray and will could be visualized in the upper lungs on the neck CT so dedicated CT of chest will better evaluate.  Active Problems:   HTN (hypertension) Continue on home antihypertensive medications of Norvasc, atenolol and lisinopril. Monitor blood pressure.    Hypokalemia Oral potassium given to replete. Check magnesium level and if low will replete Recheck electrolytes renal function morning labs    Atrial fibrillation, chronic (HCC) Chronic atrial fibrillation not on anticoagulation due to history of intracranial hemorrhage Echocardiogram ordered  Is noted that patient was found to have bedbugs while in the emergency room and was placed in isolation precautions for bedbugs   DVT prophylaxis: SCDs for DVT prophylaxis. Pt has history of intracranial bleed so not on anticoagulation.  Code Status:   Full code Family Communication:  Diagnosis plan discussed with patient. Patient verbalized understanding and agrees  with plan. Further recommendation follow as clinically indicated Disposition Plan:   Patient is from:  Home  Anticipated DC to:  Home  Anticipated DC date:  Anticipate less than 2 midnight stay in the hospital to treat medical condition  Anticipated DC barriers: No barriers to discharge identified  Admission status:  Observation  Severity of Illness: The appropriate patient status for this patient is OBSERVATION. Observation status is judged to be reasonable and necessary in order to provide the required intensity of service to ensure the patient's safety. The patient's presenting symptoms, physical exam findings, and initial radiographic and laboratory data in the context of their medical condition is felt to place them at decreased risk for further clinical deterioration. Furthermore, it is anticipated that the patient will be medically stable for discharge from the hospital within 2 midnights of admission. The following factors support the patient status of observation.     Claudean Severance Tywone Bembenek MD [facility]  How to contact the [facility] Attending or Consulting provider 7A - 7P or covering provider during after hours 7P -  7A, for this patient?   1. Check the care team in [facility] and look for a) attending/consulting [facility] provider listed and b) the [facility] team listed 2. Log into [email or web address] and use 's universal password to access. If you do not have the password, please contact the hospital operator. 3. Locate the [facility] provider you are looking for under [facility] and page to a number that you can be directly reached. 4. If you still have difficulty reaching the provider, please page the [facility] (Director on Call) for the Hospitalists listed on amion for assistance.  03/29/2020, 5:05 AM

## 2020-03-29 NOTE — ED Notes (Signed)
Env. Service ([employer]) called @ 1522-per Charge RN called by Marylene Land

## 2020-03-29 NOTE — Progress Notes (Signed)
Tammy Navarro is a 79 yr old woman who carries a past medical history significant for hypertension, stroke, subdural hematoma, subarachnoid hemorrhage, and atrial fibrillation. She presented to [facility] on 03/28/2020 following a syncopal episode. The patient states that she does not know what happened. She apparently was waiting for a taxi when she fell backward and passed out as per the report given by her son. On admission she denied chest pain, heaviness, tightness, or chest pressure. She stated that she did not know she was going to pass out. This morning she states that she doesn't remember anything. She apparently had no recent illnesses or COVID exposures.  In the ED she was found to have atrial fibrillation and mild hypokalemia. CT of her head and neck demonstrated showed no acute abnormalities. CTA of the chest demonstrated no pulmonary embolus, but did show ground glass opacities in the bilateral upper lobes that were concerning for a mild atypical pneumonia.  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 There is also aortic atherosclerosis. UA is positive for UTI. She is found to be infested with bedbugs.  [facility] were consulted to admit the patient for further evaluation and care. She was admitted by my colleague, Dr. Rachael Darby earlier this morning. The patient is being roomed in the ED awaiting a bed upstairs. She is receiving IV Rocephin and Azithromycin. Her potassium has been supplemented and is now replete. She is being monitored on telemetry and echocardiogram is pending.  The patient is awake and alert. She is in no acute distress and states that she does not remember anything about how she got to the hospital. Heart and lung exams are within normal limits. Abdomen is soft, non-tender, non-distended. Extremities are negative for cyanosis, clubbing, or edema. She is neurologically intact.

## 2020-03-29 NOTE — ED Provider Notes (Signed)
[facility] EMERGENCY DEPARTMENT Provider Note   CSN: [id number] Arrival date & time: 03/28/20  1634   History Chief Complaint  Patient presents with  . Loss of Consciousness    Tammy Navarro is a 79 y.o. female.  The history is provided by the patient.  Loss of Consciousness She has history of hypertension, stroke, subdural hematoma, subarachnoid hemorrhage, atrial fibrillation and comes in following a syncopal episode.  Apparently, she fell backward while waiting for a taxi.  She denies any injury and actually does not remember having a syncopal episode.  She denies chest pain, heaviness, tightness, pressure.  She denies dizziness or lightheadedness.  She denies palpitations.  She is not on any anticoagulants.  She has not been vaccinated against COVID-19.  Past Medical History:  Diagnosis Date  . CVA (cerebral vascular accident) (HCC)   . Hypertension   . Hypokalemia   . Hyponatremia   . Subarachnoid hemorrhage [facility])     Patient Active Problem List   Diagnosis Date Noted  . Subdural hemorrhage (HCC) 11/05/2018  . Acute metabolic encephalopathy 11/05/2018  . Hyperlipidemia 11/05/2018  . GERD (gastroesophageal reflux disease) 11/05/2018  . SAH (subarachnoid hemorrhage) (HCC) 10/24/2018  . Fall at home, initial encounter 10/24/2018  . HTN (hypertension) 10/24/2018  . New onset a-fib (HCC) 10/24/2018  . CVA (cerebral vascular accident) (HCC) 10/24/2018  . Hyponatremia 10/24/2018  . Hypokalemia 10/24/2018    No past surgical history on file.   OB History   No obstetric history on file.     Family History  Problem Relation Age of Onset  . Hypertension Mother   . Hypertension Father     Social History   Tobacco Use  . Smoking status: Never Smoker  . Smokeless tobacco: Never Used  Substance Use Topics  . Alcohol use: No  . Drug use: No    Home Medications Prior to Admission medications   Medication Sig Start Date End Date Taking?  Authorizing Provider  amLODipine (NORVASC) 5 MG tablet Take 1 tablet (5 mg total) by mouth daily. 11/27/18   Margit Hanks, MD  aspirin 81 MG chewable tablet Chew 81 mg by mouth daily.    [provider]  atenolol (TENORMIN) 100 MG tablet Take 1 tablet (100 mg total) by mouth daily. 11/27/18   Margit Hanks, MD  lisinopril (ZESTRIL) 20 MG tablet Take 2 tablets (40 mg total) by mouth daily. 11/27/18   Margit Hanks, MD  loperamide (IMODIUM A-D) 2 MG tablet Take 1 tablet (2 mg total) by mouth 4 (four) times daily as needed for diarrhea or loose stools. 11/06/12   Carmelina Dane, MD  multivitamin (GERI-TONIC) LIQD Take 30 mLs by mouth daily.    [provider]  omeprazole (PRILOSEC) 20 MG capsule Take 1 capsule (20 mg total) by mouth daily. 11/27/18   Margit Hanks, MD  ondansetron (ZOFRAN-ODT) 8 MG disintegrating tablet Take 1 tablet (8 mg total) by mouth every 8 (eight) hours as needed for nausea. 11/27/18   Margit Hanks, MD  QUEtiapine (SEROQUEL) 25 MG tablet Take 1 tablet (25 mg total) by mouth at bedtime. 11/27/18   Margit Hanks, MD  simvastatin (ZOCOR) 20 MG tablet Take 1 tablet (20 mg total) by mouth every evening. 11/27/18   Margit Hanks, MD  sodium chloride 1 g tablet Take 1 tablet (1 g total) by mouth 2 (two) times daily with a meal. 11/27/18   Margit Hanks, MD  Allergies    Patient has no known allergies.  Review of Systems   Review of Systems  Cardiovascular: Positive for syncope.  All other systems reviewed and are negative.   Physical Exam Updated Vital Signs BP (!) 177/89 (BP Location: Left Arm)   Pulse 76   Temp 97.6 F (36.4 C) (Oral)   Resp 16   SpO2 98%   Physical Exam Vitals and nursing note reviewed.   79 year old female, resting comfortably and in no acute distress. Vital signs are significant for elevated blood pressure. Oxygen saturation is 98%, which is normal. Head is normocephalic and atraumatic. PERRLA,  EOMI. Oropharynx is clear. Neck is nontender and supple without adenopathy or JVD.  There are no carotid bruits. Back is nontender and there is no CVA tenderness. Lungs are clear without rales, wheezes, or rhonchi. Chest is nontender. Heart has an irregular rhythm without murmur. Abdomen is soft, flat, nontender without masses or hepatosplenomegaly and peristalsis is normoactive. Extremities have trace edema, full range of motion is present. Skin is warm and dry without rash. Neurologic: Awake and alert, oriented to person and place but not time, speech is normal, cranial nerves are intact, there are no motor or sensory deficits.  ED Results / Procedures / Treatments   Labs (all labs ordered are listed, but only abnormal results are displayed) Labs Reviewed  BASIC METABOLIC PANEL - Abnormal; Notable for the following components:      Result Value   Potassium 3.3 (*)    Glucose, Bld 128 (*)    BUN 24 (*)    All other components within normal limits  CBC - Abnormal; Notable for the following components:   Hemoglobin 11.9 (*)    All other components within normal limits  URINALYSIS, ROUTINE W REFLEX MICROSCOPIC - Abnormal; Notable for the following components:   Color, Urine AMBER (*)    APPearance CLOUDY (*)    Ketones, ur 5 (*)    Protein, ur 30 (*)    Leukocytes,Ua MODERATE (*)    Bacteria, UA MANY (*)    Non Squamous Epithelial 0-5 (*)    All other components within normal limits  CBG MONITORING, ED - Abnormal; Notable for the following components:   Glucose-Capillary 128 (*)    All other components within normal limits  CBG MONITORING, ED    EKG EKG Interpretation  Date/Time:  Wednesday March 28 2020 16:40:47 EDT Ventricular Rate:  91 PR Interval:    QRS Duration: 84 QT Interval:  360 QTC Calculation: 442 R Axis:   -7 Text Interpretation: Atrial fibrillation Abnormal ECG When compared with ECG of 10/24/2018, No significant change was found Confirmed by Dione Booze  ([id number]) on 03/29/2020 2:08:17 AM   Radiology DG Chest 2 View  Result Date: 03/29/2020 CLINICAL DATA:  Syncope EXAM: CHEST - 2 VIEW COMPARISON:  10/06/2008 FINDINGS: The lungs are well expanded and are symmetric. A focal pulmonary opacity is seen within the left mid lung zone possibly representing a small pulmonary nodule or nodular infiltrate. This is new since prior examination. Lungs are otherwise clear. No pneumothorax or pleural effusion. Cardiac size within normal limits. Pulmonary vascularity is normal. IMPRESSION: No radiographic evidence of acute cardiopulmonary disease. Focal pulmonary opacity within the left mid lung zone, new since prior examination. This could be better assessed with dedicated CT imaging. Alternatively, a follow-up chest radiograph could be obtained in 3-6 months to document stability. Electronically Signed   By: Helyn Numbers MD   On: 03/29/2020  03:50   CT HEAD WO CONTRAST  Result Date: 03/28/2020 CLINICAL DATA:  79 year old female with head trauma. EXAM: CT HEAD WITHOUT CONTRAST CT CERVICAL SPINE WITHOUT CONTRAST TECHNIQUE: Multidetector CT imaging of the head and cervical spine was performed following the standard protocol without intravenous contrast. Multiplanar CT image reconstructions of the cervical spine were also generated. COMPARISON:  CT dated 10/27/2018. FINDINGS: CT HEAD FINDINGS Brain: Moderate age-related atrophy and chronic microvascular ischemic changes. There is no acute intracranial hemorrhage. No mass effect or midline shift. No extra-axial fluid collection. Vascular: No hyperdense vessel or unexpected calcification. Skull: Normal. Negative for fracture or focal lesion. Sinuses/Orbits: No acute finding. Other: None CT CERVICAL SPINE FINDINGS Alignment: No acute subluxation. Grade 1 C5-C6 retrolisthesis. Skull base and vertebrae: No acute fracture. Osteopenia. Soft tissues and spinal canal: No prevertebral fluid or swelling. No visible canal hematoma.  Disc levels: Multilevel degenerative changes with endplate irregularity and disc space narrowing most prominent at C5-C6. Upper chest: Bilateral clusters of ground-glass nodular densities most consistent with pneumonia or aspiration. Clinical correlation is recommended. Other: Bilateral carotid bulb calcified plaques. IMPRESSION: 1. No acute intracranial pathology. Moderate age-related atrophy and chronic microvascular ischemic changes. 2. No acute/traumatic cervical spine pathology. Multilevel degenerative changes. 3. Bilateral clusters of ground-glass nodular densities most consistent with pneumonia or aspiration. Clinical correlation is recommended. Electronically Signed   By: Elgie CollardArash  Radparvar M.D.   On: 03/28/2020 18:44   CT Cervical Spine Wo Contrast  Result Date: 03/28/2020 CLINICAL DATA:  79 year old female with head trauma. EXAM: CT HEAD WITHOUT CONTRAST CT CERVICAL SPINE WITHOUT CONTRAST TECHNIQUE: Multidetector CT imaging of the head and cervical spine was performed following the standard protocol without intravenous contrast. Multiplanar CT image reconstructions of the cervical spine were also generated. COMPARISON:  CT dated 10/27/2018. FINDINGS: CT HEAD FINDINGS Brain: Moderate age-related atrophy and chronic microvascular ischemic changes. There is no acute intracranial hemorrhage. No mass effect or midline shift. No extra-axial fluid collection. Vascular: No hyperdense vessel or unexpected calcification. Skull: Normal. Negative for fracture or focal lesion. Sinuses/Orbits: No acute finding. Other: None CT CERVICAL SPINE FINDINGS Alignment: No acute subluxation. Grade 1 C5-C6 retrolisthesis. Skull base and vertebrae: No acute fracture. Osteopenia. Soft tissues and spinal canal: No prevertebral fluid or swelling. No visible canal hematoma. Disc levels: Multilevel degenerative changes with endplate irregularity and disc space narrowing most prominent at C5-C6. Upper chest: Bilateral clusters of  ground-glass nodular densities most consistent with pneumonia or aspiration. Clinical correlation is recommended. Other: Bilateral carotid bulb calcified plaques. IMPRESSION: 1. No acute intracranial pathology. Moderate age-related atrophy and chronic microvascular ischemic changes. 2. No acute/traumatic cervical spine pathology. Multilevel degenerative changes. 3. Bilateral clusters of ground-glass nodular densities most consistent with pneumonia or aspiration. Clinical correlation is recommended. Electronically Signed   By: Elgie CollardArash  Radparvar M.D.   On: 03/28/2020 18:44    Procedures Procedures  Medications Ordered in ED Medications  lactated ringers bolus 1,000 mL (has no administration in time range)  potassium chloride SA (KLOR-CON) CR tablet 40 mEq (40 mEq Oral Given 03/29/20 0357)    ED Course  I have reviewed the triage vital signs and the nursing notes.  Pertinent labs & imaging results that were available during my care of the patient were reviewed by me and considered in my medical decision making (see chart for details).  MDM Rules/Calculators/A&P Syncope.  Atrial fibrillation.  Old records were reviewed, and she was 1st noted to be in atrial fibrillation during hospitalization for subdural hematoma and subarachnoid  hemorrhage and decision was made not to anticoagulate because of dementia and fall risk.  CT head and cervical spine showed no acute process although there are some groundglass densities in the superior aspect of the lungs consistent with aspiration or pneumonia.  Clinically, she does not have pneumonia as she has no fever, no cough and no hypoxia.  Labs show mild hypokalemia.  She is given a dose of oral potassium.  Urinalysis is suggestive of UTI with 21-50 WBCs and many bacteria.  However, patient has no UTI symptoms.  Will send for culture but hold off on antibiotics for now.  Will check chest x-ray given findings on cervical spine CT.  She will need to be admitted for  further evaluation of her syncope.  Case is discussed with Dr. Rachael Darby of [facility], who agrees to admit the patient.  Chest x-ray does show a new nodule in the left midlung, possibly a nodular infiltrate.  She will be sent for CT angiogram to further evaluate this and to rule out pulmonary embolism.  Final Clinical Impression(s) / ED Diagnoses Final diagnoses:  Syncope, unspecified syncope type  Chronic atrial fibrillation (HCC)  Hypokalemia    Rx / DC Orders ED Discharge Orders    None       Dione Booze, MD 03/29/20 [phone]

## 2020-03-30 ENCOUNTER — Observation Stay (HOSPITAL_BASED_OUTPATIENT_CLINIC_OR_DEPARTMENT_OTHER): Payer: Medicare Other

## 2020-03-30 DIAGNOSIS — K219 Gastro-esophageal reflux disease without esophagitis: Secondary | ICD-10-CM | POA: Diagnosis present

## 2020-03-30 DIAGNOSIS — Z87891 Personal history of nicotine dependence: Secondary | ICD-10-CM | POA: Diagnosis not present

## 2020-03-30 DIAGNOSIS — Z79899 Other long term (current) drug therapy: Secondary | ICD-10-CM | POA: Diagnosis not present

## 2020-03-30 DIAGNOSIS — I361 Nonrheumatic tricuspid (valve) insufficiency: Secondary | ICD-10-CM | POA: Diagnosis not present

## 2020-03-30 DIAGNOSIS — R0902 Hypoxemia: Secondary | ICD-10-CM | POA: Diagnosis not present

## 2020-03-30 DIAGNOSIS — B888 Other specified infestations: Secondary | ICD-10-CM | POA: Diagnosis present

## 2020-03-30 DIAGNOSIS — J479 Bronchiectasis, uncomplicated: Secondary | ICD-10-CM | POA: Diagnosis not present

## 2020-03-30 DIAGNOSIS — I482 Chronic atrial fibrillation, unspecified: Secondary | ICD-10-CM | POA: Diagnosis not present

## 2020-03-30 DIAGNOSIS — R54 Age-related physical debility: Secondary | ICD-10-CM | POA: Diagnosis not present

## 2020-03-30 DIAGNOSIS — R5381 Other malaise: Secondary | ICD-10-CM

## 2020-03-30 DIAGNOSIS — J219 Acute bronchiolitis, unspecified: Secondary | ICD-10-CM | POA: Diagnosis not present

## 2020-03-30 DIAGNOSIS — N390 Urinary tract infection, site not specified: Secondary | ICD-10-CM | POA: Diagnosis not present

## 2020-03-30 DIAGNOSIS — I700 Atherosclerosis of aorta: Secondary | ICD-10-CM | POA: Diagnosis not present

## 2020-03-30 DIAGNOSIS — B961 Klebsiella pneumoniae [K. pneumoniae] as the cause of diseases classified elsewhere: Secondary | ICD-10-CM | POA: Diagnosis present

## 2020-03-30 DIAGNOSIS — E860 Dehydration: Secondary | ICD-10-CM | POA: Diagnosis not present

## 2020-03-30 DIAGNOSIS — E876 Hypokalemia: Secondary | ICD-10-CM | POA: Diagnosis not present

## 2020-03-30 DIAGNOSIS — Z8673 Personal history of transient ischemic attack (TIA), and cerebral infarction without residual deficits: Secondary | ICD-10-CM | POA: Diagnosis not present

## 2020-03-30 DIAGNOSIS — Z20822 Contact with and (suspected) exposure to covid-19: Secondary | ICD-10-CM | POA: Diagnosis not present

## 2020-03-30 DIAGNOSIS — Z7982 Long term (current) use of aspirin: Secondary | ICD-10-CM | POA: Diagnosis not present

## 2020-03-30 DIAGNOSIS — I714 Abdominal aortic aneurysm, without rupture: Secondary | ICD-10-CM | POA: Diagnosis present

## 2020-03-30 DIAGNOSIS — I2510 Atherosclerotic heart disease of native coronary artery without angina pectoris: Secondary | ICD-10-CM | POA: Diagnosis present

## 2020-03-30 DIAGNOSIS — I252 Old myocardial infarction: Secondary | ICD-10-CM | POA: Diagnosis not present

## 2020-03-30 DIAGNOSIS — R55 Syncope and collapse: Secondary | ICD-10-CM | POA: Diagnosis not present

## 2020-03-30 DIAGNOSIS — Z8249 Family history of ischemic heart disease and other diseases of the circulatory system: Secondary | ICD-10-CM | POA: Diagnosis not present

## 2020-03-30 DIAGNOSIS — I10 Essential (primary) hypertension: Secondary | ICD-10-CM | POA: Diagnosis not present

## 2020-03-30 DIAGNOSIS — F0390 Unspecified dementia without behavioral disturbance: Secondary | ICD-10-CM | POA: Diagnosis present

## 2020-03-30 LAB — ECHOCARDIOGRAM COMPLETE
AR max vel: 1.81 cm2
AV Area VTI: 2.05 cm2
AV Area mean vel: 1.88 cm2
AV Mean grad: 3.8 mmHg
AV Peak grad: 7 mmHg
Ao pk vel: 1.32 m/s
S' Lateral: 2.6 cm
Weight: 2136 oz

## 2020-03-30 LAB — BASIC METABOLIC PANEL
Anion gap: 6 (ref 5–15)
BUN: 16 mg/dL (ref 8–23)
CO2: 26 mmol/L (ref 22–32)
Calcium: 8.9 mg/dL (ref 8.9–10.3)
Chloride: 105 mmol/L (ref 98–111)
Creatinine, Ser: 0.63 mg/dL (ref 0.44–1.00)
GFR, Estimated: >60 mL/min (ref >60)
Glucose, Bld: 105 mg/dL — ABNORMAL HIGH (ref 70–99)
Potassium: 3.8 mmol/L (ref 3.5–5.1)
Sodium: 137 mmol/L (ref 135–145)

## 2020-03-30 LAB — TSH: TSH: 4.474 u[IU]/mL (ref 0.350–4.500)

## 2020-03-30 LAB — URINE CULTURE: Culture: >=100000 — AB

## 2020-03-30 LAB — GLUCOSE, CAPILLARY: Glucose-Capillary: 94 mg/dL (ref 70–99)

## 2020-03-30 LAB — CBC
HCT: 35.1 % — ABNORMAL LOW (ref 36.0–46.0)
Hemoglobin: 11.3 g/dL — ABNORMAL LOW (ref 12.0–15.0)
MCH: 29.7 pg (ref 26.0–34.0)
MCHC: 32.2 g/dL (ref 30.0–36.0)
MCV: 92.4 fL (ref 80.0–100.0)
Platelets: 206 10*3/uL (ref 150–400)
RBC: 3.8 MIL/uL — ABNORMAL LOW (ref 3.87–5.11)
RDW: 12.9 % (ref 11.5–15.5)
WBC: 5.2 10*3/uL (ref 4.0–10.5)
nRBC: 0 % (ref 0.0–0.2)

## 2020-03-30 MED ORDER — HALOPERIDOL LACTATE 5 MG/ML IJ SOLN
2.0000 mg | Freq: Four times a day (QID) | INTRAMUSCULAR | Status: DC | PRN
  Administered 2020-03-31: 2 mg via INTRAVENOUS
  Filled 2020-03-30: qty 1

## 2020-03-30 NOTE — Evaluation (Signed)
Physical Therapy Evaluation Patient Details Name: Tammy Navarro MRN: [id number] DOB: 11/16/1940 Today's Date: 03/30/2020   History of Present Illness  Tammy Navarro is a 79 yr old woman who carries a past medical history significant for hypertension, stroke, subdural hematoma, subarachnoid hemorrhage, and atrial fibrillation. She presented to [facility] on 03/28/2020 following a syncopal episode. In ED, it was discovered that she had bedbugs. Pt is on precautions.  Clinical Impression  Pt admitted with above diagnosis. Pt was able to ambulate in room with min guard assist with RW and min assist without RW.  Pt states she is unsteady unless she holds onto something. Will benefit from SNF for therapy on d/c.  Pt currently with functional limitations due to the deficits listed below (see PT Problem List). Pt will benefit from skilled PT to increase their independence and safety with mobility to allow discharge to the venue listed below.      Follow Up Recommendations SNF;Supervision/Assistance - 24 hour    Equipment Recommendations  None recommended by PT    Recommendations for Other Services       Precautions / Restrictions Precautions: contact precautions for bedbugs Precautions: Fall Restrictions Weight Bearing Restrictions: No      Mobility  Bed Mobility Overal bed mobility: Needs Assistance Bed Mobility: Supine to Sit     Supine to sit: Min assist     General bed mobility comments: a little assist to come to eOB  Transfers Overall transfer level: Needs assistance Equipment used: Rolling walker (2 wheeled) Transfers: Sit to/from Stand Sit to Stand: Min guard         General transfer comment: cues for hand placement and to steady on standing.   Ambulation/Gait Ambulation/Gait assistance: Min guard;Min assist Gait Distance (Feet): 65 Feet Assistive device: Rolling walker (2 wheeled) Gait Pattern/deviations: Step-through pattern;Decreased stride length;Trunk flexed;Drifts  right/left   Gait velocity interpretation: <1.31 ft/sec, indicative of household ambulator General Gait Details: Pt ambulated in room and overall did well with RW.  When ambulating without RW, neded incr steadying assist.  Pt reports she feels that she needs to hold to something all the time.   Stairs            Wheelchair Mobility    Modified Rankin (Stroke Patients Only)       Balance Overall balance assessment: Needs assistance Sitting-balance support: No upper extremity supported;Feet supported Sitting balance-Leahy Scale: Fair     Standing balance support: Bilateral upper extremity supported;During functional activity Standing balance-Leahy Scale: Poor Standing balance comment: relies on UE support                             Pertinent Vitals/Pain Pain Assessment: No/denies pain    Home Living Family/patient expects to be discharged to:: Private residence Living Arrangements: Alone Available Help at Discharge: Family;Available 24 hours/day (son lives with pt and is disabled, house in backyard) Type of Home: House Home Access: Ramped entrance     Home Layout: One level Home Equipment: Environmental consultant - 2 wheels;Bedside commode;Shower seat;Wheelchair - manual      Prior Function Level of Independence: Independent with assistive device(s)         Comments: used RW PTA per pt at all times.  Intermittent confusion      Hand Dominance   Dominant Hand: Right    Extremity/Trunk Assessment   Upper Extremity Assessment Upper Extremity Assessment: Defer to OT evaluation    Lower Extremity Assessment Lower  Extremity Assessment: Generalized weakness    Cervical / Trunk Assessment Cervical / Trunk Assessment: Kyphotic  Communication   Communication: No difficulties  Cognition Arousal/Alertness: Awake/alert Behavior During Therapy: WFL for tasks assessed/performed Overall Cognitive Status: Within Functional Limits for tasks assessed                                         General Comments      Exercises General Exercises - Lower Extremity Ankle Circles/Pumps: AROM;Both;10 reps;Supine Long Arc Quad: AROM;Both;10 reps;Seated   Assessment/Plan    PT Assessment Patient needs continued PT services  PT Problem List Decreased balance;Decreased activity tolerance;Decreased mobility;Decreased knowledge of use of DME;Decreased safety awareness;Decreased knowledge of precautions       PT Treatment Interventions DME instruction;Gait training;Functional mobility training;Therapeutic activities;Therapeutic exercise;Balance training;Patient/family education    PT Goals (Current goals can be found in the Care Plan section)  Acute Rehab PT Goals Patient Stated Goal: to get better PT Goal Formulation: With patient Time For Goal Achievement: 04/13/20 Potential to Achieve Goals: Good    Frequency Min 3X/week   Barriers to discharge        Co-evaluation               AM-PAC PT "6 Clicks" Mobility  Outcome Measure Help needed turning from your back to your side while in a flat bed without using bedrails?: A Little Help needed moving from lying on your back to sitting on the side of a flat bed without using bedrails?: A Little Help needed moving to and from a bed to a chair (including a wheelchair)?: A Little Help needed standing up from a chair using your arms (e.g., wheelchair or bedside chair)?: A Little Help needed to walk in hospital room?: A Little Help needed climbing 3-5 steps with a railing? : A Lot 6 Click Score: 17    End of Session Equipment Utilized During Treatment: Gait belt Activity Tolerance: Patient limited by fatigue Patient left: in chair;with call bell/phone within reach;with chair alarm set Nurse Communication: Mobility status PT Visit Diagnosis: Unsteadiness on feet (R26.81);Muscle weakness (generalized) (M62.81)    Time: 1145-1200 PT Time Calculation (min) (ACUTE ONLY): 15  min   Charges:   PT Evaluation $PT Eval Moderate Complexity: 1 Mod          Analisse Randle W,PT Acute Rehabilitation Services Pager:  [phone]  Office:  [phone]    Berline Lopes 03/30/2020, 3:24 PM

## 2020-03-30 NOTE — Progress Notes (Signed)
Patient HR went to 30s, non-sustained. CCMD called to say pt had a 2sec pause at that time. RN checked on patient and will continue to monitor. MD notified.

## 2020-03-30 NOTE — Plan of Care (Signed)
  Problem: Education: Goal: Knowledge of General Education information will improve Description Including pain rating scale, medication(s)/side effects and non-pharmacologic comfort measures Outcome: Progressing   Problem: Health Behavior/Discharge Planning: Goal: Ability to manage health-related needs will improve Outcome: Progressing   

## 2020-03-30 NOTE — Progress Notes (Signed)
  Echocardiogram 2D Echocardiogram has been performed.  Tammy Navarro 03/30/2020, 1:36 PM

## 2020-03-30 NOTE — Progress Notes (Signed)
PROGRESS NOTE  Tammy Navarro ALP:[id number] DOB: 28-May-1941 DOA: 03/28/2020 PCP: Gaspar Garbe, MD  Brief History   Ms Tammy Navarro is a 79 yr old woman who carries a past medical history significant for hypertension, stroke, subdural hematoma, subarachnoid hemorrhage, and atrial fibrillation. She presented to [facility] on 03/28/2020 following a syncopal episode. The patient states that she does not know what happened. She apparently was waiting for a taxi when she fell backward and passed out as per the report given by her son. On admission she denied chest pain, heaviness, tightness, or chest pressure. She stated that she did not know she was going to pass out. This morning she states that she doesn't remember anything. She apparently had no recent illnesses or COVID exposures.  In the ED she was found to have atrial fibrillation and mild hypokalemia. CT of her head and neck demonstrated showed no acute abnormalities. CTA of the chest demonstrated no pulmonary embolus, but did show ground glass opacities in the bilateral upper lobes that were concerning for a mild atypical pneumonia.  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 There is also aortic atherosclerosis. UA is positive for UTI. She is found to be infested with bedbugs.  [facility] were consulted to admit the patient for further evaluation and care. She was admitted by my colleague, Dr. Rachael Darby earlier this morning. The patient is being roomed in the ED awaiting a bed upstairs. She is receiving IV Rocephin and Azithromycin. Her potassium has been supplemented and is now replete. She is being monitored on telemetry and echocardiogram is pending.  She has been evaluated by PT/OT. Their recommendation is for SNF placement. TOC has been consulted.  Consultants  . None  Procedures  . None  Antibiotics   Anti-infectives (From admission, onward)   Start      Dose/Rate Route Frequency Ordered Stop   03/29/20 1400  cefTRIAXone (ROCEPHIN) 2 g in sodium chloride 0.9 % 100 mL IVPB        2 g 200 mL/hr over 30 Minutes Intravenous Every 24 hours 03/29/20 1332 04/02/20 2059   03/29/20 1400  azithromycin (ZITHROMAX) 500 mg in sodium chloride 0.9 % 250 mL IVPB        500 mg 250 mL/hr over 60 Minutes Intravenous Every 24 hours 03/29/20 1332 04/02/20 2059    .  Marland Kitchen   Subjective  The patient is awake and lying crosswise across her bed. No new complaints.  Objective   Vitals:  Vitals:   03/30/20 0643 03/30/20 0919  BP: (!) 144/84 125/65  Pulse: 99 64  Resp: 20 16  Temp: 97.9 F (36.6 C) 97.8 F (36.6 C)  SpO2: 97% 100%   Exam:  Constitutional:  . The patient is awake, alert, and oriented x 3. No acute distress. She appears elderly, weak, and frail. Respiratory:  . No increased work of breathing. . No wheezes, rales, or rhonchi . No tactile fremitus Cardiovascular:  . Regular rate and rhythm . No murmurs, ectopy, or gallups. . No lateral PMI. No thrills. Abdomen:  . Abdomen is soft, non-tender, non-distended . No hernias, masses, or organomegaly . Normoactive bowel sounds.  Musculoskeletal:  . No cyanosis, clubbing, or edema Skin:  . No rashes, lesions, ulcers . palpation of skin: no induration or nodules Neurologic:  . CN 2-12 intact . Sensation all 4 extremities intact Psychiatric:  . Mental status o Mood, affect appropriate o Orientation  to person, place, time  . judgment and insight appear intact  I have personally reviewed the following:   Today's Data  . Vitals, BMP, and CBC  Micro Data  . Urine culture from 03/28/2020 is positive for klebsiella pneumonia. Sensitive to all except ampicillin. . Blood culture x 2 has had no growth.  Cardiology Data  . Echocardiogram: Unremarkable.  Scheduled Meds: . amLODipine  5 mg Oral Daily  . aspirin  81 mg Oral Daily  . lisinopril  40 mg Oral Daily  . simvastatin  20 mg  Oral QPM  . sodium chloride flush  3 mL Intravenous Q12H   Continuous Infusions: . sodium chloride 10 mL/hr at 03/30/20 0106  . azithromycin Stopped (03/29/20 2255)  . cefTRIAXone (ROCEPHIN)  IV Stopped (03/29/20 2145)    Principal Problem:   Syncope Active Problems:   HTN (hypertension)   Hypokalemia   Atrial fibrillation, chronic (HCC)   LOS: 0 days   Syncope: DDx: 1)Cardiac: Possibly related to atrial fibrillation although there has been no rapid rate recorded on telemetry or EKG. Rate has been between 49 and 69 since admission. Echocardiogram was unremarkable. 2) Bronchiolitis/Bronchiectasis - could contribute to vasovagal syncope. 3) Hypokalemia: Could have possibly contributed. At the present time I believe that the event was due to dehydration and vasovagal syncope related to cough from bronchiolitis particularly in the setting of hypokalemia.  Debility: The patient has been evaluated by PT/OT. She requires assistance for mobility in bed, transfers, and ambulation with a rolling walker. She is unsure on her feet. PT feels that she is appropriate for SNF.  Hypertension: The patient is normotensive on norvasc and lisinopril.  Hypokalemia: 2.9 on admission. Currently 3.5. Monitor.  Atrial fibrillation: Rate controlled. Unable to determine if patient is on anticoagulation at home. She is unable to held with medication reconciliation. Her sons say that she doesn't keep her meds in bottles, so they have no idea what she is taking. Monitor on telemetry.  I have seen and examined this patient myself. I have spent 34 minutes in her evaluation and care.  DVT Prophylaxis: SCD's CODE STATUS: Full Code Family Communication: None available Disposition:  Status is: Inpatient  Remains inpatient appropriate because:Unsafe d/c plan   Dispo: The patient is from: Home              Anticipated d/c is to: Home              Anticipated d/c date is: 2 days              Patient currently is  not medically stable to d/c.  Delrae Hagey, DO [facility] Direct contact: see [email or web address]  7PM-7AM contact night coverage as above 03/30/2020, 4:09 PM  LOS: 0 days

## 2020-03-31 ENCOUNTER — Inpatient Hospital Stay (HOSPITAL_COMMUNITY): Payer: Medicare Other

## 2020-03-31 DIAGNOSIS — I10 Essential (primary) hypertension: Secondary | ICD-10-CM | POA: Diagnosis not present

## 2020-03-31 DIAGNOSIS — R55 Syncope and collapse: Secondary | ICD-10-CM | POA: Diagnosis not present

## 2020-03-31 DIAGNOSIS — I482 Chronic atrial fibrillation, unspecified: Secondary | ICD-10-CM | POA: Diagnosis not present

## 2020-03-31 DIAGNOSIS — E876 Hypokalemia: Secondary | ICD-10-CM | POA: Diagnosis not present

## 2020-03-31 LAB — GLUCOSE, CAPILLARY
Glucose-Capillary: 75 mg/dL (ref 70–99)
Glucose-Capillary: 93 mg/dL (ref 70–99)

## 2020-03-31 NOTE — Progress Notes (Signed)
Patient was found up at the sink stating that she wanted to go home. I explainefd that she is at [facility] and she is has UTI she state that she knew that but she is going home and to call her son phone call made no answer. I told her I needed a weight he became upset and wouldn't stand long enough to get, she kept trying to leave to go to her house. When I got her back in bed 2mg  Haldol was administered. LPN

## 2020-03-31 NOTE — Evaluation (Signed)
Occupational Therapy Evaluation Patient Details Name: Tammy Navarro MRN: [id number] DOB: 23-Jun-1940 Today's Date: 03/31/2020    History of Present Illness Tammy Navarro is a 79 yr old woman who carries a past medical history significant for hypertension, stroke, subdural hematoma, subarachnoid hemorrhage, and atrial fibrillation. She presented to [facility] on 03/28/2020 following a syncopal episode.    Clinical Impression   Patient admitted for the above diagnosis.  Presents with generalized weakness, decreased stand tolerance and balance, fair activity tolerance; all of which are impacting independence with ADL and functional in room mobility.  Patient states she was pretty independent at home and was driving.  SNF has been recommended post acute.  She is open to rehab, but would like to consider HH as well.  OT will continue to follow in the acute setting and assist with discharge recommendations when cleared by MD.      Follow Up Recommendations  SNF    Equipment Recommendations  3 in 1 bedside commode    Recommendations for Other Services       Precautions / Restrictions Precautions Precautions: Fall Precaution Comments: contact precautions for bedbugs Restrictions Weight Bearing Restrictions: No      Mobility Bed Mobility Overal bed mobility: Modified Independent Bed Mobility: Rolling;Supine to Sit Rolling: Modified independent (Device/Increase time)   Supine to sit: Modified independent (Device/Increase time);HOB elevated        Transfers Overall transfer level: Needs assistance   Transfers: Sit to/from Stand;Stand Pivot Transfers Sit to Stand: Min guard Stand pivot transfers: Min assist       General transfer comment: no RW used for SPT    Balance   Sitting-balance support: No upper extremity supported;Feet supported Sitting balance-Leahy Scale: Good     Standing balance support: No upper extremity supported Standing balance-Leahy Scale: Poor                              ADL either performed or assessed with clinical judgement   ADL Overall ADL's : Needs assistance/impaired Eating/Feeding: Independent;Sitting   Grooming: Wash/dry hands;Wash/dry face;Sitting;Set up   Upper Body Bathing: Set up;Sitting           Lower Body Dressing: Min guard;Sit to/from stand Lower Body Dressing Details (indicate cue type and reason): decreased stand balance, Min Guard at RW level for standing. Toilet Transfer: Minimal Sports coach Details (indicate cue type and reason): no AD Toileting- Clothing Manipulation and Hygiene: Supervision/safety;Sitting/lateral lean       Functional mobility during ADLs: Minimal assistance       Vision Patient Visual Report: No change from baseline       Perception     Praxis      Pertinent Vitals/Pain Pain Assessment: No/denies pain     Hand Dominance Right   Extremity/Trunk Assessment Upper Extremity Assessment Upper Extremity Assessment: Overall WFL for tasks assessed           Communication Communication Communication: No difficulties   Cognition Arousal/Alertness: Awake/alert Behavior During Therapy: WFL for tasks assessed/performed Overall Cognitive Status: Within Functional Limits for tasks assessed                                                      Home Living Family/patient expects to be discharged to:: Private residence Living Arrangements:  Alone Available Help at Discharge: Family;Available 24 hours/day Type of Home: House Home Access: Ramped entrance     Home Layout: One level     Bathroom Shower/Tub: Chief Strategy Officer: Standard     Home Equipment: Environmental consultant - 2 wheels;Bedside commode;Shower seat;Wheelchair - manual          Prior Functioning/Environment Level of Independence: Independent with assistive device(s)        Comments: Patient states she was Ind with cooking, cealning, and was driving  locally.        OT Problem List: Decreased strength;Decreased activity tolerance;Impaired balance (sitting and/or standing);Decreased safety awareness      OT Treatment/Interventions: Self-care/ADL training;Therapeutic exercise;Balance training;Therapeutic activities    OT Goals(Current goals can be found in the care plan section) Acute Rehab OT Goals Patient Stated Goal: I need to walk better Time For Goal Achievement: 04/14/20 Potential to Achieve Goals: Good ADL Goals Pt Will Perform Grooming: Independently;sitting Pt Will Perform Lower Body Bathing: with modified independence;sit to/from stand Pt Will Perform Lower Body Dressing: with modified independence;sit to/from stand Pt Will Transfer to Toilet: with modified independence;ambulating;regular height toilet  OT Frequency: Min 2X/week   Barriers to D/C: Decreased caregiver support          Co-evaluation              AM-PAC OT "6 Clicks" Daily Activity     Outcome Measure Help from another person eating meals?: None Help from another person taking care of personal grooming?: None Help from another person toileting, which includes using toliet, bedpan, or urinal?: A Little Help from another person bathing (including washing, rinsing, drying)?: A Little Help from another person to put on and taking off regular upper body clothing?: None Help from another person to put on and taking off regular lower body clothing?: A Lot 6 Click Score: 20   End of Session Nurse Communication: Other (comment) (cleared treatment)  Activity Tolerance: Patient tolerated treatment well Patient left: in chair;with call bell/phone within reach;with chair alarm set  OT Visit Diagnosis: Unsteadiness on feet (R26.81);Muscle weakness (generalized) (M62.81)                Time: 6237-6283 OT Time Calculation (min): 24 min Charges:  OT General Charges $OT Visit: 1 Visit OT Evaluation $OT Eval Moderate Complexity: 1  Mod  03/31/2020  Rich, OTR/L  Acute Rehabilitation Services  Office:  [phone]   Suzanna Obey 03/31/2020, 1:06 PM

## 2020-03-31 NOTE — Progress Notes (Signed)
[facility] notified that bp 161/87 and O2 stats 85% on room air and placed on 2 L O2

## 2020-03-31 NOTE — TOC Initial Note (Addendum)
Transition of Care [facility]) - Initial/Assessment Note    Patient Details  Name: Tammy Navarro MRN: [id number] Date of Birth: 1940-07-17  [facility]) CM/SW Contact:    Jimmy Picket, [place] Phone Number: 03/31/2020, 3:32 PM  Clinical Narrative:                  CSW spoke with pts son Onalee Hua by phone, [phone]. CSW introduced self and explained her role at the hospital.  Pt is oriented to person and place. Onalee Hua reports pt lives at home with her son Judie Grieve. Onalee Hua reports pt is unable to take care for herself, uses a walker to get around house with difficulty, pt only eats when someone brings her food and does not bathe regularly due to her bathroom not being useable. Onalee Hua reports the pts house is extremely dirty and is filled with rats and roaches. Onalee Hua reports he has made 3 DSS reports and has had no follow up. Onalee Hua does not believe pt should return home. Onalee Hua states his 3 brothers are not able to take care of her. Onalee Hua drives trucks and is is only in town saturdays and sundays.   CSW reviewed pt/ot reccs of SNF. Onalee Hua is in agreement. Onalee Hua inquired about next steps since pt cant return home. CSW stated that ALF, ILF or LTC could be an option but would required a financial obligation. CSW informed Onalee Hua that the SNF social worker will work with him to find a solution. Onalee Hua inquired about medicaid. CSW advised Onalee Hua to reach out to DSS to start the medicaid requirements to see if shes eligible. CSW encouraged pt to review [email or web address] to look at snf ratings. Pt has not had covid vaccines.   TOC will continue to follow.  Expected Discharge Plan: Skilled Nursing Facility Barriers to Discharge: Continued Medical Work up, English as a second language teacher   Patient Goals and CMS Choice Patient states their goals for this hospitalization and ongoing recovery are:: Pt unable to verbalize CMS Medicare.gov Compare Post Acute Care list provided to:: Patient Choice offered to / list  presented to : Adult Children  Expected Discharge Plan and Services Expected Discharge Plan: Skilled Nursing Facility       Living arrangements for the past 2 months: Skilled Nursing Facility                                      Prior Living Arrangements/Services Living arrangements for the past 2 months: Skilled Nursing Facility Lives with:: Adult Children Patient language and need for interpreter reviewed:: Yes Do you feel safe going back to the place where you live?: Yes      Need for Family Participation in Patient Care: Yes (Comment) Care giver support system in place?: Yes (comment)   Criminal Activity/Legal Involvement Pertinent to Current Situation/Hospitalization: No - Comment as needed  Activities of Daily Living      Permission Sought/Granted Permission sought to share information with : Family Supports, Oceanographer granted to share information with : Yes, Verbal Permission Granted  Share Information with NAME: Onalee Hua  Permission granted to share info w AGENCY: SNF  Permission granted to share info w Relationship: Son  Permission granted to share info w Contact Information: [phone]  Emotional Assessment Appearance:: Appears stated age Attitude/Demeanor/Rapport: Engaged Affect (typically observed): Appropriate Orientation: : Oriented to Self, Oriented to Place Alcohol / Substance Use: Not Applicable Psych Involvement: No (comment)  Admission diagnosis:  Hypokalemia [E87.6] Syncope [R55] Chronic atrial fibrillation (HCC) [I48.20] Syncope, unspecified syncope type [R55] Patient Active Problem List   Diagnosis Date Noted  . Syncope 03/29/2020  . Atrial fibrillation, chronic (HCC) 03/29/2020  . Subdural hemorrhage (HCC) 11/05/2018  . Acute metabolic encephalopathy 11/05/2018  . Hyperlipidemia 11/05/2018  . GERD (gastroesophageal reflux disease) 11/05/2018  . SAH (subarachnoid hemorrhage) (HCC) 10/24/2018  . Fall  at home, initial encounter 10/24/2018  . HTN (hypertension) 10/24/2018  . New onset a-fib (HCC) 10/24/2018  . CVA (cerebral vascular accident) (HCC) 10/24/2018  . Hyponatremia 10/24/2018  . Hypokalemia 10/24/2018   PCP:  Gaspar Garbe, MD Pharmacy:   [facility] [phone] [place], [place] - [address] AT [address] [address] [place] [address] Phone: [phone] Fax: [phone]  [facility] [place], [place] - [address] [place] [address] Phone: [phone] Fax: [phone]     Social Determinants of Health (SDOH) Interventions    Readmission Risk Interventions No flowsheet data found.  Jimmy Picket, Theresia Majors, [place] Clinical Social Worker [phone]

## 2020-03-31 NOTE — Progress Notes (Signed)
PROGRESS NOTE  SHARNIKA BINNEY FYB:[id number] DOB: 1940-08-16 DOA: 03/28/2020 PCP: Gaspar Garbe, MD  Brief History   Ms Jarnagin is a 79 yr old woman who carries a past medical history significant for hypertension, stroke, subdural hematoma, subarachnoid hemorrhage, and atrial fibrillation. She presented to [facility] on 03/28/2020 following a syncopal episode. The patient states that she does not know what happened. She apparently was waiting for a taxi when she fell backward and passed out as per the report given by her son. On admission she denied chest pain, heaviness, tightness, or chest pressure. She stated that she did not know she was going to pass out. This morning she states that she doesn't remember anything. She apparently had no recent illnesses or COVID exposures.  In the ED she was found to have atrial fibrillation and mild hypokalemia. CT of her head and neck demonstrated showed no acute abnormalities. CTA of the chest demonstrated no pulmonary embolus, but did show ground glass opacities in the bilateral upper lobes that were concerning for a mild atypical pneumonia.  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 There is also aortic atherosclerosis. UA is positive for UTI. She is found to be infested with bedbugs.  [facility] were consulted to admit the patient for further evaluation and care. She was admitted by my colleague, Dr. Rachael Darby earlier this morning. The patient is being roomed in the ED awaiting a bed upstairs. She is receiving IV Rocephin and Azithromycin. Her potassium has been supplemented and is now replete. She is being monitored on telemetry and echocardiogram is pending.  She has been evaluated by PT/OT. Their recommendation is for SNF placement. TOC has been consulted.  Consultants  . None  Procedures  . None  Antibiotics   Anti-infectives (From admission, onward)   Start      Dose/Rate Route Frequency Ordered Stop   03/29/20 1400  cefTRIAXone (ROCEPHIN) 2 g in sodium chloride 0.9 % 100 mL IVPB        2 g 200 mL/hr over 30 Minutes Intravenous Every 24 hours 03/29/20 1332 04/02/20 2059   03/29/20 1400  azithromycin (ZITHROMAX) 500 mg in sodium chloride 0.9 % 250 mL IVPB        500 mg 250 mL/hr over 60 Minutes Intravenous Every 24 hours 03/29/20 1332 04/02/20 2059     .   Subjective  The patient is awake and lresting comfortably in bed. No new complaints at this time. She has been quite agitated and confused overnight and has required haldol for safety.  Objective   Vitals:  Vitals:   03/31/20 0840 03/31/20 1338  BP: (!) 136/95 (!) 101/55  Pulse: (!) 55 67  Resp: 18 19  Temp: 97.9 F (36.6 C) 97.8 F (36.6 C)  SpO2: (!) 88% 97%   Exam:  Constitutional:  . The patient is awake, alert, and oriented x 3. No acute distress. She appears elderly, weak, and frail. Respiratory:  . No increased work of breathing. . No wheezes, rales, or rhonchi . No tactile fremitus Cardiovascular:  . Regular rate and rhythm . No murmurs, ectopy, or gallups. . No lateral PMI. No thrills. Abdomen:  . Abdomen is soft, non-tender, non-distended . No hernias, masses, or organomegaly . Normoactive bowel sounds.  Musculoskeletal:  . No cyanosis, clubbing, or edema Skin:  . No rashes, lesions, ulcers . palpation of skin: no induration or nodules Neurologic:  . CN 2-12  intact . Sensation all 4 extremities intact Psychiatric:  . Mental status o Mood, affect appropriate o Orientation to person, place, time  . judgment and insight appear intact  I have personally reviewed the following:   Today's Data  . Vitals, BMP, and CBC  Micro Data  . Urine culture from 03/28/2020 is positive for klebsiella pneumonia. Sensitive to all except ampicillin. . Blood culture x 2 has had no growth.  Cardiology Data  . Echocardiogram: Unremarkable.  Scheduled Meds: . amLODipine   5 mg Oral Daily  . aspirin  81 mg Oral Daily  . lisinopril  40 mg Oral Daily  . simvastatin  20 mg Oral QPM  . sodium chloride flush  3 mL Intravenous Q12H   Continuous Infusions: . sodium chloride Stopped (03/30/20 2341)  . azithromycin Stopped (03/30/20 2247)  . cefTRIAXone (ROCEPHIN)  IV Stopped (03/30/20 2056)    Principal Problem:   Syncope Active Problems:   HTN (hypertension)   Hypokalemia   Atrial fibrillation, chronic (HCC)   LOS: 1 day   Syncope: DDx: 1)Cardiac: Possibly related to atrial fibrillation although there has been no rapid rate recorded on telemetry or EKG. Rate has been between 49 and 69 since admission. Echocardiogram was unremarkable. 2) Bronchiolitis/Bronchiectasis - could contribute to vasovagal syncope. 3) Hypokalemia: Could have possibly contributed. At the present time I believe that the event was due to dehydration and vasovagal syncope related to cough from bronchiolitis particularly in the setting of hypokalemia.  Hypoxia: The patient had some hypoxia overnight. CXR demonstrates no acute pathology. Suspect that the patient may have had some bronchial plugging that has now cleared. She is currently saturating 97% on room air.  Delirium: The patient has been agitated and confused overnight. She received haldol as necessary for safety.   Debility: The patient has been evaluated by PT/OT. She requires assistance for mobility in bed, transfers, and ambulation with a rolling walker. She is unsure on her feet. PT feels that she is appropriate for SNF. Her son, Theodoro Grist, who has spoken to SW regarding the patient's poor living conditions agrees that the patient should not return to her home at discharge. The patient's other three sons apparently have substance abuse issues and take advantage of the patient in financial terms and are not helpful in terms of improving her living conditions.  Hypertension: The patient is normotensive on norvasc and  lisinopril.  Hypokalemia: 2.9 on admission. Currently 3.8. Monitor.  Atrial fibrillation: Rate controlled. Unable to determine if patient is on anticoagulation at home. She is unable to held with medication reconciliation. Her sons say that she doesn't keep her meds in bottles, so they have no idea what she is taking. Monitor on telemetry.  I have seen and examined this patient myself. I have spent 32 minutes in her evaluation and care.  DVT Prophylaxis: SCD's CODE STATUS: Full Code Family Communication: I have discussed the patient in detail with her son Theodoro Grist. [phone] Disposition:  Status is: Inpatient  Remains inpatient appropriate because:Unsafe d/c plan   Dispo: The patient is from: Home              Anticipated d/c is to: SNF              Anticipated d/c date is: 2 days              Patient currently is not medically stable to d/c.  Turner Kunzman, DO [facility] Direct contact: see [email or web address]  7PM-7AM contact night coverage  as above 03/31/2020, 4:18 PM  LOS: 0 days

## 2020-03-31 NOTE — Progress Notes (Signed)
Patient O2 stats 81% on room air encouraged to deep breath stats increased to 85% on room air placed on 2L nasal cannula, will continue to monitor. Ilean Skill LPN

## 2020-03-31 NOTE — Progress Notes (Signed)
Pt sons called and expressed concerns about mothers living conditions including rats and roaches.Son that leaves with patient is handicapped and unable to take care of her. Abbie Sons 9one the four sons) physically came to room today and stated that he is town once a week from driving trucks.and would like to be listed and notified when bed offer are accepted. [phone].

## 2020-03-31 NOTE — [place] FL2 (Signed)
[place] MEDICAID FL2 LEVEL OF CARE SCREENING TOOL     IDENTIFICATION  Patient Name: Tammy Navarro Birthdate: 01/06/1941 Sex: female Admission Date (Current Location): 03/28/2020  [facility] and [place] Number:  Producer, television/film/video and Address:  [facility] [place], [address] [place], [place] [address]      Provider Number: [id number]  Attending Physician Name and Address:  Fran Lowes, DO  Relative Name and Phone Number:       Current Level of Care: Hospital Recommended Level of Care: Skilled Nursing Facility Prior Approval Number:    Date Approved/Denied:   PASRR Number: [id number]  Discharge Plan: SNF    Current Diagnoses: Patient Active Problem List   Diagnosis Date Noted  . Syncope 03/29/2020  . Atrial fibrillation, chronic (HCC) 03/29/2020  . Subdural hemorrhage (HCC) 11/05/2018  . Acute metabolic encephalopathy 11/05/2018  . Hyperlipidemia 11/05/2018  . GERD (gastroesophageal reflux disease) 11/05/2018  . SAH (subarachnoid hemorrhage) (HCC) 10/24/2018  . Fall at home, initial encounter 10/24/2018  . HTN (hypertension) 10/24/2018  . New onset a-fib (HCC) 10/24/2018  . CVA (cerebral vascular accident) (HCC) 10/24/2018  . Hyponatremia 10/24/2018  . Hypokalemia 10/24/2018    Orientation RESPIRATION BLADDER Height & Weight     Self, Time, Situation, Place  Normal Incontinent Weight: 133 lb 8 oz (60.6 kg) Height:     BEHAVIORAL SYMPTOMS/MOOD NEUROLOGICAL BOWEL NUTRITION STATUS      Incontinent Diet (See discharge summary)  AMBULATORY STATUS COMMUNICATION OF NEEDS Skin   Extensive Assist Verbally Normal                       Personal Care Assistance Level of Assistance  Bathing, Feeding, Dressing Bathing Assistance: Maximum assistance Feeding assistance: Limited assistance Dressing Assistance: Maximum assistance     Functional Limitations Info  Speech, Hearing, Sight Sight Info: Adequate Hearing Info: Adequate Speech  Info: Adequate    SPECIAL CARE FACTORS FREQUENCY  OT (By licensed OT), PT (By licensed PT)     PT Frequency: 5x a week OT Frequency: 5x a week            Contractures      Additional Factors Info  Code Status, Allergies Code Status Info: Full Allergies Info: NKA           Current Medications (03/31/2020):  This is the current hospital active medication list Current Facility-Administered Medications  Medication Dose Route Frequency Provider Last Rate Last Admin  . 0.9 %  sodium chloride infusion   Intravenous PRN Swayze, Ava, DO   Stopped at 03/30/20 2341  . acetaminophen (TYLENOL) tablet 650 mg  650 mg Oral Q6H PRN Chotiner, Claudean Severance, MD   650 mg at 03/31/20 2951   Or  . acetaminophen (TYLENOL) suppository 650 mg  650 mg Rectal Q6H PRN Chotiner, Claudean Severance, MD      . amLODipine (NORVASC) tablet 5 mg  5 mg Oral Daily Chotiner, Claudean Severance, MD   5 mg at 03/31/20 0959  . aspirin chewable tablet 81 mg  81 mg Oral Daily Chotiner, Claudean Severance, MD   81 mg at 03/31/20 0959  . azithromycin (ZITHROMAX) 500 mg in sodium chloride 0.9 % 250 mL IVPB  500 mg Intravenous Q24H Swayze, Ava, DO   Stopped at 03/30/20 2247  . cefTRIAXone (ROCEPHIN) 2 g in sodium chloride 0.9 % 100 mL IVPB  2 g Intravenous Q24H Swayze, Ava, DO   Stopped at 03/30/20 2056  .  haloperidol lactate (HALDOL) injection 2 mg  2 mg Intravenous Q6H PRN Swayze, Ava, DO   2 mg at 03/31/20 0122  . lisinopril (ZESTRIL) tablet 40 mg  40 mg Oral Daily Chotiner, Claudean Severance, MD   40 mg at 03/31/20 0959  . ondansetron (ZOFRAN) tablet 4 mg  4 mg Oral Q6H PRN Chotiner, Claudean Severance, MD       Or  . ondansetron [facility]) injection 4 mg  4 mg Intravenous Q6H PRN Chotiner, Claudean Severance, MD      . simvastatin (ZOCOR) tablet 20 mg  20 mg Oral QPM Chotiner, Claudean Severance, MD   20 mg at 03/30/20 1726  . sodium chloride flush (NS) 0.9 % injection 3 mL  3 mL Intravenous Q12H Chotiner, Claudean Severance, MD   3 mL at 03/31/20 1517     Discharge Medications: Please  see discharge summary for a list of discharge medications.  Relevant Imaging Results:  Relevant Lab Results:   Additional Information SS#: [id number]  Tammy Navarro, [place]

## 2020-04-01 DIAGNOSIS — E876 Hypokalemia: Secondary | ICD-10-CM | POA: Diagnosis not present

## 2020-04-01 DIAGNOSIS — I10 Essential (primary) hypertension: Secondary | ICD-10-CM | POA: Diagnosis not present

## 2020-04-01 DIAGNOSIS — R55 Syncope and collapse: Secondary | ICD-10-CM | POA: Diagnosis not present

## 2020-04-01 DIAGNOSIS — I482 Chronic atrial fibrillation, unspecified: Secondary | ICD-10-CM | POA: Diagnosis not present

## 2020-04-01 LAB — LEGIONELLA PNEUMOPHILA SEROGP 1 UR AG: L. pneumophila Serogp 1 Ur Ag: NEGATIVE

## 2020-04-01 LAB — GLUCOSE, CAPILLARY: Glucose-Capillary: 104 mg/dL — ABNORMAL HIGH (ref 70–99)

## 2020-04-01 NOTE — Progress Notes (Signed)
Walked patient to the Bathroom, HR 140s non-sustaining. Will continue to monitor.

## 2020-04-01 NOTE — Progress Notes (Signed)
PROGRESS NOTE  Tammy Navarro XJD:[id number] DOB: Aug 07, 1940 DOA: 03/28/2020 PCP: Gaspar Garbe, MD  Brief History   Tammy Navarro is a 79 yr old woman who carries a past medical history significant for hypertension, stroke, subdural hematoma, subarachnoid hemorrhage, and atrial fibrillation. She presented to [facility] on 03/28/2020 following a syncopal episode. The patient states that she does not know what happened. She apparently was waiting for a taxi when she fell backward and passed out as per the report given by her son. On admission she denied chest pain, heaviness, tightness, or chest pressure. She stated that she did not know she was going to pass out. This morning she states that she doesn't remember anything. She apparently had no recent illnesses or COVID exposures.  In the ED she was found to have atrial fibrillation and mild hypokalemia. CT of her head and neck demonstrated showed no acute abnormalities. CTA of the chest demonstrated no pulmonary embolus, but did show ground glass opacities in the bilateral upper lobes that were concerning for a mild atypical pneumonia.  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 There is also aortic atherosclerosis. UA is positive for UTI. She is found to be infested with bedbugs.  [facility] were consulted to admit the patient for further evaluation and care. She was admitted by my colleague, Dr. Rachael Darby earlier this morning. The patient is being roomed in the ED awaiting a bed upstairs. She is receiving IV Rocephin and Azithromycin. Her potassium has been supplemented and is now replete. She is being monitored on telemetry and echocardiogram demonstrates an EF of 65% with normal function, no regional wall motion abnormalities, and indeterminate diastolic parameters. Lt atrial size is severely dilated. Rt atrial size is mildly dilated. Normal RV function and size. Normal  pulmonaryartery systolic pressure.   She has been evaluated by PT/OT. Their recommendation is for SNF placement. TOC has been consulted.  Consultants  . None  Procedures  . None  Antibiotics   Anti-infectives (From admission, onward)   Start     Dose/Rate Route Frequency Ordered Stop   03/29/20 1400  cefTRIAXone (ROCEPHIN) 2 g in sodium chloride 0.9 % 100 mL IVPB        2 g 200 mL/hr over 30 Minutes Intravenous Every 24 hours 03/29/20 1332 04/02/20 2059   03/29/20 1400  azithromycin (ZITHROMAX) 500 mg in sodium chloride 0.9 % 250 mL IVPB        500 mg 250 mL/hr over 60 Minutes Intravenous Every 24 hours 03/29/20 1332 04/02/20 2059     .   Subjective  The patient is awake and resting comfortably in bed. No new complaints at this time.   Objective   Vitals:  Vitals:   04/01/20 0926 04/01/20 1142  BP: 118/79 112/71  Pulse: 80 79  Resp: 20 15  Temp:  98.2 F (36.8 C)  SpO2: 100% 100%   Exam:  Constitutional:  . The patient is awake, alert, and oriented x 3. No acute distress. She appears elderly, weak, and frail. Respiratory:  . No increased work of breathing. . No wheezes, rales, or rhonchi . No tactile fremitus Cardiovascular:  . Regular rate and rhythm . No murmurs, ectopy, or gallups. . No lateral PMI. No thrills. Abdomen:  . Abdomen is soft, non-tender, non-distended . No hernias, masses, or organomegaly . Normoactive bowel sounds.  Musculoskeletal:  . No cyanosis, clubbing, or edema Skin:  .  No rashes, lesions, ulcers . palpation of skin: no induration or nodules Neurologic:  . CN 2-12 intact . Sensation all 4 extremities intact Psychiatric:  . Mental status o Mood, affect appropriate o Orientation to person, place, time  . judgment and insight appear intact  I have personally reviewed the following:   Today's Data  . Dispensing optician  . Urine culture from 03/28/2020 is positive for klebsiella pneumonia. Sensitive to all except  ampicillin. . Blood culture x 2 has had no growth.  Cardiology Data  . Echocardiogram: Unremarkable.  Scheduled Meds: . amLODipine  5 mg Oral Daily  . aspirin  81 mg Oral Daily  . lisinopril  40 mg Oral Daily  . simvastatin  20 mg Oral QPM  . sodium chloride flush  3 mL Intravenous Q12H   Continuous Infusions: . sodium chloride 250 mL (03/31/20 2302)  . azithromycin 500 mg (03/31/20 2303)  . cefTRIAXone (ROCEPHIN)  IV 2 g (03/31/20 2208)    Principal Problem:   Syncope Active Problems:   HTN (hypertension)   Hypokalemia   Atrial fibrillation, chronic (HCC)   LOS: 2 days   Syncope: DDx: 1)Cardiac: Possibly related to atrial fibrillation although there has been no rapid rate recorded on telemetry or EKG. Rate has been between 49 and 69 since admission. Echocardiogram was unremarkable. 2) Bronchiolitis/Bronchiectasis - could contribute to vasovagal syncope. 3) Hypokalemia: Could have possibly contributed. At the present time I believe that the event was due to dehydration and vasovagal syncope related to cough from bronchiolitis particularly in the setting of hypokalemia.  Hypoxia: The patient had some hypoxia overnight. CXR demonstrates no acute pathology. Suspect that the patient may have had some bronchial plugging that has now cleared. She is currently saturating 97% on room air.  Delirium: The patient has been agitated and confused overnight. She received haldol as necessary for safety.   Debility: The patient has been evaluated by PT/OT. She requires assistance for mobility in bed, transfers, and ambulation with a rolling walker. She is unsure on her feet. PT feels that she is appropriate for SNF. Her son, Tammy Navarro, who has spoken to SW regarding the patient's poor living conditions agrees that the patient should not return to her home at discharge. The patient's other three sons apparently have substance abuse issues and take advantage of the patient in financial terms and are not  helpful in terms of improving her living conditions.  Hypertension: The patient is normotensive on norvasc and lisinopril.  Hypokalemia: 2.9 on admission. Currently 3.8. Monitor.  Atrial fibrillation: Rate controlled. Unable to determine if patient is on anticoagulation at home. She is unable to held with medication reconciliation. Her sons say that she doesn't keep her meds in bottles, so they have no idea what she is taking. Monitor on telemetry.  I have seen and examined this patient myself. I have spent 30 minutes in her evaluation and care.  DVT Prophylaxis: SCD's CODE STATUS: Full Code Family Communication: I have discussed the patient in detail with her son Tammy Navarro. [phone] Disposition:  Status is: Inpatient  Remains inpatient appropriate because:Unsafe d/c plan   Dispo: The patient is from: Home              Anticipated d/c is to: SNF              Anticipated d/c date is: 2 days              Patient currently is not medically stable  to d/c.  Morris Markham, DO [facility] Direct contact: see [email or web address]  7PM-7AM contact night coverage as above 04/01/2020, 2:28 PM  LOS: 0 days

## 2020-04-02 DIAGNOSIS — R55 Syncope and collapse: Secondary | ICD-10-CM | POA: Diagnosis not present

## 2020-04-02 DIAGNOSIS — I482 Chronic atrial fibrillation, unspecified: Secondary | ICD-10-CM | POA: Diagnosis not present

## 2020-04-02 DIAGNOSIS — E876 Hypokalemia: Secondary | ICD-10-CM | POA: Diagnosis not present

## 2020-04-02 DIAGNOSIS — I10 Essential (primary) hypertension: Secondary | ICD-10-CM | POA: Diagnosis not present

## 2020-04-02 LAB — GLUCOSE, CAPILLARY: Glucose-Capillary: 94 mg/dL (ref 70–99)

## 2020-04-02 NOTE — Progress Notes (Signed)
PROGRESS NOTE  Tammy Navarro YHC:[id number] DOB: April 11, 1941 DOA: 03/28/2020 PCP: Gaspar Garbe, MD  Brief History   Ms Scheiber is a 79 yr old woman who carries a past medical history significant for hypertension, stroke, subdural hematoma, subarachnoid hemorrhage, and atrial fibrillation. She presented to [facility] on 03/28/2020 following a syncopal episode. The patient states that she does not know what happened. She apparently was waiting for a taxi when she fell backward and passed out as per the report given by her son. On admission she denied chest pain, heaviness, tightness, or chest pressure. She stated that she did not know she was going to pass out. This morning she states that she doesn't remember anything. She apparently had no recent illnesses or COVID exposures.  In the ED she was found to have atrial fibrillation and mild hypokalemia. CT of her head and neck demonstrated showed no acute abnormalities. CTA of the chest demonstrated no pulmonary embolus, but did show ground glass opacities in the bilateral upper lobes that were concerning for a mild atypical pneumonia.  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 There is also aortic atherosclerosis. UA is positive for UTI. She is found to be infested with bedbugs.  [facility] were consulted to admit the patient for further evaluation and care. She was admitted by my colleague, Dr. Rachael Darby earlier this morning. The patient is being roomed in the ED awaiting a bed upstairs. She is receiving IV Rocephin and Azithromycin. Her potassium has been supplemented and is now replete. She is being monitored on telemetry and echocardiogram demonstrates an EF of 65% with normal function, no regional wall motion abnormalities, and indeterminate diastolic parameters. Lt atrial size is severely dilated. Rt atrial size is mildly dilated. Normal RV function and size. Normal  pulmonaryartery systolic pressure.   She has been evaluated by PT/OT. Their recommendation is for SNF placement. TOC has been consulted.  Consultants   None  Procedures   None  Antibiotics   Anti-infectives (From admission, onward)   Start     Dose/Rate Route Frequency Ordered Stop   03/29/20 1400  cefTRIAXone (ROCEPHIN) 2 g in sodium chloride 0.9 % 100 mL IVPB        2 g 200 mL/hr over 30 Minutes Intravenous Every 24 hours 03/29/20 1332 04/02/20 2059   03/29/20 1400  azithromycin (ZITHROMAX) 500 mg in sodium chloride 0.9 % 250 mL IVPB        500 mg 250 mL/hr over 60 Minutes Intravenous Every 24 hours 03/29/20 1332 04/02/20 2059        Subjective  The patient is sleeping and resting comfortably in bed.  She is not awakened.  Objective   Vitals:  Vitals:   04/02/20 0804 04/02/20 1152  BP: 114/84 (!) 142/78  Pulse: 80   Resp: 20 20  Temp:  98.7 F (37.1 C)  SpO2: 100% 99%   Exam:  Constitutional:   The patient is sleeping and is not awakened. No acute distress. She appears elderly, weak, and frail. Respiratory:   No increased work of breathing.  No wheezes, rales, or rhonchi  No tactile fremitus Cardiovascular:   Regular rate and rhythm  No murmurs, ectopy, or gallups.  No lateral PMI. No thrills. Abdomen:   Abdomen is soft, non-tender, non-distended  No hernias, masses, or organomegaly  Normoactive bowel sounds.  Musculoskeletal:   No cyanosis, clubbing, or edema Skin:   No rashes,  lesions, ulcers  palpation of skin: no induration or nodules Neurologic:   Unable to evaluate due to the patient's inability to cooperate with exam. Psychiatric:   Unable to evaluate due to the patient's inability to cooperate with exam.  I have personally reviewed the following:   Today's Data   Vitals  Micro Data   Urine culture from 03/28/2020 is positive for klebsiella pneumonia. Sensitive to all except ampicillin.  Blood culture x 2 has had no  growth.  Cardiology Data   Echocardiogram: Unremarkable.  Scheduled Meds:  amLODipine  5 mg Oral Daily   aspirin  81 mg Oral Daily   lisinopril  40 mg Oral Daily   simvastatin  20 mg Oral QPM   sodium chloride flush  3 mL Intravenous Q12H   Continuous Infusions:  sodium chloride 10 mL/hr at 04/02/20 0315   azithromycin Stopped (04/02/20 0000)   cefTRIAXone (ROCEPHIN)  IV Stopped (04/01/20 2248)    Principal Problem:   Syncope Active Problems:   HTN (hypertension)   Hypokalemia   Atrial fibrillation, chronic (HCC)   LOS: 3 days   Syncope: DDx: 1)Cardiac: Possibly related to atrial fibrillation although there has been no rapid rate recorded on telemetry or EKG. Rate has been between 49 and 69 since admission. Echocardiogram was unremarkable. 2) Bronchiolitis/Bronchiectasis - could contribute to vasovagal syncope. 3) Hypokalemia: Could have possibly contributed. At the present time I believe that the event was due to dehydration and vasovagal syncope related to cough from bronchiolitis particularly in the setting of hypokalemia.  Hypoxia: The patient had some hypoxia overnight. CXR demonstrates no acute pathology. Suspect that the patient may have had some bronchial plugging that has now cleared. She is currently saturating 99% on room air.  Delirium/dementia: The patient has been agitated and confused. She receives haldol as necessary for safety. I do not feel that this patient is capable of appreciating her medical situation or making decisions regarding her medical situation. Her son, Theodoro Grist, is in agreement. Plan is for the patient to go to SNF at discharge.  Debility: The patient has been evaluated by PT/OT. She requires assistance for mobility in bed, transfers, and ambulation with a rolling walker. She is unsure on her feet. PT feels that she is appropriate for SNF. Her son, Theodoro Grist, who has spoken to SW regarding the patient's poor living conditions agrees that the patient  should not return to her home at discharge. The patient's other three sons apparently have substance abuse issues and take advantage of the patient in financial terms and are not helpful in terms of improving her living conditions. She is not capable of appreciating her circumstances or acting in her own best interests.  Hypertension: The patient is normotensive on norvasc and lisinopril.  Hypokalemia: 2.9 on admission. Supplemented.  Monitor.  Atrial fibrillation: Rate controlled. Unable to determine if patient is on anticoagulation at home. She is unable to held with medication reconciliation. Her sons say that she doesn't keep her meds in bottles, so they have no idea what she is taking. Monitor on telemetry.  I have seen and examined this patient myself. I have spent 32 minutes in her evaluation and care.  DVT Prophylaxis: SCD's CODE STATUS: Full Code Family Communication: I have discussed the patient in detail with her son Theodoro Grist. [phone] Disposition:  Status is: Inpatient  Remains inpatient appropriate because:Unsafe d/c plan   Dispo: The patient is from: Home  Anticipated d/c is to: SNF              Anticipated d/c date is: 2 days              Patient currently is not medically stable to d/c.  Egbert Seidel, DO [facility] Direct contact: see [email or web address]  7PM-7AM contact night coverage as above 04/02/2020, 12:47 PM  LOS: 0 days

## 2020-04-02 NOTE — Plan of Care (Signed)
  Problem: Clinical Measurements: Goal: Ability to maintain clinical measurements within normal limits will improve Outcome: Adequate for Discharge   Problem: Clinical Measurements: Goal: Will remain free from infection Outcome: Adequate for Discharge   

## 2020-04-02 NOTE — Progress Notes (Signed)
CSW spoke with son of pt, Tammy Navarro,  presented offers, stated he would like [facility]. CSW will reach out to SNF to see if [facility] can accept and continue to follow.

## 2020-04-02 NOTE — Progress Notes (Signed)
Physical Therapy Treatment Patient Details Name: Tammy Navarro MRN: [id number] DOB: 07-Dec-1940 Today's Date: 04/02/2020    History of Present Illness Tammy Navarro is a 79 yr old woman who carries a past medical history significant for hypertension, stroke, subdural hematoma, subarachnoid hemorrhage, and atrial fibrillation. She presented to [facility] on 03/28/2020 following a syncopal episode.     PT Comments    Pt participated in session; pt able to perform supine therex with minimal cueing; pt with increased assist/cueing needed with use of RW due to poor safety awareness; pt continues to demonstrate deficits in balance, strength, coordination, gait, safety and endurance, pt will benefit from skilled PT to address deficits and maximize independence with functional mobility prior to discharge     Follow Up Recommendations  SNF;Supervision/Assistance - 24 hour     Equipment Recommendations  None recommended by PT    Recommendations for Other Services       Precautions / Restrictions Precautions Precautions: Fall Precaution Comments: contact precautions for bedbugs Restrictions Weight Bearing Restrictions: No    Mobility  Bed Mobility Overal bed mobility: Modified Independent Bed Mobility: Supine to Sit;Sit to Supine     Supine to sit: Modified independent (Device/Increase time);HOB elevated Sit to supine: Modified independent (Device/Increase time);HOB elevated      Transfers Overall transfer level: Needs assistance Equipment used: Rolling walker (2 wheeled) Transfers: Sit to/from [employer] Sit to Stand: Min guard Stand pivot transfers: Min guard          Ambulation/Gait Ambulation/Gait assistance: Min guard Gait Distance (Feet): 40 Feet Assistive device: Rolling walker (2 wheeled) Gait Pattern/deviations: Step-through pattern;Decreased stride length;Trunk flexed     General Gait Details: pt requiring increased cueing to improve  alignment/positioning in RW during turns; pt wtih decreased velocity and increased forward flexion, decreased proximity to [employer] Mobility    Modified Rankin (Stroke Patients Only)       Balance                                            Cognition Arousal/Alertness: Awake/alert Behavior During Therapy: WFL for tasks assessed/performed Overall Cognitive Status: Within Functional Limits for tasks assessed                                        Exercises General Exercises - Lower Extremity Long Arc Quad: AROM;Both;10 reps;Seated Heel Slides: AROM;Both;20 reps;Supine Hip ABduction/ADduction: AROM;Both;20 reps;Supine Straight Leg Raises: AROM;Both;20 reps;Supine    General Comments General comments (skin integrity, edema, etc.): VSS      Pertinent Vitals/Pain Pain Assessment: No/denies pain    Home Living                      Prior Function            PT Goals (current goals can now be found in the care plan section) Acute Rehab PT Goals Patient Stated Goal: I want to get up PT Goal Formulation: With patient Time For Goal Achievement: 04/13/20 Potential to Achieve Goals: Good Progress towards PT goals: Progressing toward goals    Frequency    Min 2X/week      PT Plan Frequency needs to be updated  Co-evaluation              AM-PAC PT "6 Clicks" Mobility   Outcome Measure  Help needed turning from your back to your side while in a flat bed without using bedrails?: A Little Help needed moving from lying on your back to sitting on the side of a flat bed without using bedrails?: A Little Help needed moving to and from a bed to a chair (including a wheelchair)?: A Little Help needed standing up from a chair using your arms (e.g., wheelchair or bedside chair)?: A Little Help needed to walk in hospital room?: A Little Help needed climbing 3-5 steps with a railing? : A Lot 6  Click Score: 17    End of Session Equipment Utilized During Treatment: Gait belt Activity Tolerance: Patient tolerated treatment well Patient left: in bed;with call bell/phone within reach;with bed alarm set Nurse Communication: Mobility status PT Visit Diagnosis: Unsteadiness on feet (R26.81);Muscle weakness (generalized) (M62.81)     Time: 6160-7371 PT Time Calculation (min) (ACUTE ONLY): 18 min  Charges:  $Therapeutic Exercise: 8-22 mins                     Ginette Otto, DPT Acute Rehabilitation Services [phone]   Lucretia Field 04/02/2020, 2:16 PM

## 2020-04-03 DIAGNOSIS — R55 Syncope and collapse: Secondary | ICD-10-CM | POA: Diagnosis not present

## 2020-04-03 DIAGNOSIS — E876 Hypokalemia: Secondary | ICD-10-CM | POA: Diagnosis not present

## 2020-04-03 DIAGNOSIS — I482 Chronic atrial fibrillation, unspecified: Secondary | ICD-10-CM | POA: Diagnosis not present

## 2020-04-03 DIAGNOSIS — I10 Essential (primary) hypertension: Secondary | ICD-10-CM | POA: Diagnosis not present

## 2020-04-03 LAB — CULTURE, BLOOD (ROUTINE X 2)
Culture: NO GROWTH
Culture: NO GROWTH
Special Requests: ADEQUATE
Special Requests: ADEQUATE

## 2020-04-03 LAB — SARS CORONAVIRUS 2 BY RT PCR (HOSPITAL ORDER, PERFORMED IN ~~LOC~~ HOSPITAL LAB): SARS Coronavirus 2: NEGATIVE

## 2020-04-03 LAB — GLUCOSE, CAPILLARY: Glucose-Capillary: 124 mg/dL — ABNORMAL HIGH (ref 70–99)

## 2020-04-03 NOTE — Discharge Summary (Signed)
Physician Discharge Summary  Tammy BoardsLoretta J Navarro AVW:[id number] DOB: 01/10/1941 DOA: 03/28/2020  PCP: [name], Tammy W, MD  Admit date: 03/28/2020 Discharge date: 04/03/2020  Recommendations for Outpatient Follow-up:  1. Discharge to SNF for rehab 2. Follow up with PCP in 7-10 days after discharge from facility  Discharge Diagnoses: Principal diagnosis is #1 1. Syncope due to vasavagal syncope 2. Bronchilotis, bronchiectasis 3. Hypoxia due to bronchitis 4. Delirium in the setting of dementia  5. Debility 6. Hypertension 7. Hypokalemia: Resolved. 8. Atrial fibrillation with controlled rate 9. Lack of capacity for medical decision making 10. Unable to care for self. 11. Bed Bug infestation  Discharge Condition: Fair  Disposition: SNF  Diet recommendation: Regula  Filed Weights   04/01/20 0249 04/02/20 0057 04/03/20 0551  Weight: 61 kg 61.7 kg 61.2 kg    History of present illness: Tammy Navarro is a 79 y.o. female with medical history significant for hypertension, stroke, subdural hematoma, subarachnoid hemorrhage, atrial fibrillation and comes in following a syncopal episode. Apparently, she fell backward while waiting for a taxi. She denies any injury and actually does not remember having a syncopal episode. She was with her son at the time of the event. She states she did not have any palpitations, chest pain or warning that she was going to pass out. She states she has not had any recent head injury or trauma.She denies chest pain, heaviness, tightness, pressure. She denies dizziness or lightheadedness. She denies palpitations. She is not on any anticoagulants. She has not been vaccinated against COVID-19. He states she has not had any recent illnesses and has had no known exposures to COVID-19. At this time she states that she feels fine.  ED Course: Patient was found to have atrial fibrillation on EKG and a mildly decreased potassium level on labs. CT of her head  and neck showed no acute abnormalities. It was noted that she had some bilateral opacities in her upper lungs on the neck CT which could be better visualized with a dedicated chest CT. CT angiography of her chest has been ordered in the emergency room and is pending. Patient was found to have bedbugs while in the emergency room and was placed in contact precautions.   Hospital Course: Tammy Navarro is a 79 yr old woman who carries a past medical history significant forhypertension, stroke, subdural hematoma, subarachnoid hemorrhage,andatrial fibrillation. She [facility]/2020 following a syncopal episode. The patient states that she does not know what happened. She apparently was waiting for a taxi when she fell backward and passed out as per the report given by her son. On admission she denied chest pain, heaviness, tightness, or chest pressure. She stated that she did not know she was going to pass out. This morning she states that she doesn't remember anything. She apparently had no recent illnesses or COVID exposures.  In the ED she was found to have atrial fibrillation and mild hypokalemia. CT of her head and neck demonstrated showed no acute abnormalities. CTA of the chest demonstrated no pulmonary embolus, but did show ground glass opacities in the bilateral upper lobes that were concerning for a mild atypical pneumonia. 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 There is also aortic atherosclerosis. UA is positive for UTI. She is found to be infested with bedbugs.  [facility] were consulted to admit the patient for further evaluation and care. She was admitted by my  colleague, Dr. Rachael Darby earlier this morning. The patient is being roomed in the ED awaiting a bed upstairs. She is receiving IV Rocephin and Azithromycin. Her potassium has been supplemented and is now replete. She is being monitored on  telemetry and echocardiogram demonstrates an EF of 65% with normal function, no regional wall motion abnormalities, and indeterminate diastolic parameters. Lt atrial size is severely dilated. Rt atrial size is mildly dilated. Normal RV function and size. Normal pulmonaryartery systolic pressure.   She has been evaluated by PT/OT. Their recommendation is for SNF placement. TOC has been consulted, and placement has been found. The patient is currently agreeing to go to SNF.   Today's assessment: S: The patient is resting comfortably. No new complaints. O: Vitals:  Vitals:   04/03/20 0551 04/03/20 1129  BP: 131/89 134/74  Pulse: 80 86  Resp: 18 15  Temp: 98.8 F (37.1 C) 98.9 F (37.2 C)  SpO2: 95% 98%   Exam:  Constitutional:   The patient is awake and alert. No acute distress. She appears elderly, weak, and frail. She is confused. She does not know where she is, the date, why she is in the hospital, or why she needs to take her medications. Respiratory:   No increased work of breathing.  No wheezes, rales, or rhonchi  No tactile fremitus Cardiovascular:   Regular rate and rhythm  No murmurs, ectopy, or gallups.  No lateral PMI. No thrills. Abdomen:   Abdomen is soft, non-tender, non-distended  No hernias, masses, or organomegaly  Normoactive bowel sounds.  Musculoskeletal:   No cyanosis, clubbing, or edema Skin:   No rashes, lesions, ulcers  palpation of skin: no induration or nodules Neurologic:   Unable to evaluate due to the patient's inability to cooperate with exam. Psychiatric:   Unable to evaluate due to the patient's inability to cooperate with exam. Pt is confused and nor oriented.  Discharge Instructions  Discharge Instructions    Activity as tolerated - No restrictions   Complete by: As directed    Call MD for:  difficulty breathing, headache or visual disturbances   Complete by: As directed    Call MD for:  temperature >100.4   Complete  by: As directed    Diet general   Complete by: As directed    Discharge instructions   Complete by: As directed    Discharge to SNF for rehab Follow up with PCP in 7-10 days after discharge from facility   Increase activity slowly   Complete by: As directed      Allergies as of 04/03/2020   No Known Allergies     Medication List    STOP taking these medications   atenolol 100 MG tablet Commonly known as: TENORMIN   loperamide 2 MG tablet Commonly known as: Imodium A-D   multivitamin Liqd   omeprazole 20 MG capsule Commonly known as: PRILOSEC   ondansetron 8 MG disintegrating tablet Commonly known as: ZOFRAN-ODT   sodium chloride 1 g tablet     TAKE these medications   amLODipine 5 MG tablet Commonly known as: NORVASC Take 1 tablet (5 mg total) by mouth daily.   aspirin 81 MG chewable tablet Chew 81 mg by mouth daily.   lisinopril 20 MG tablet Commonly known as: ZESTRIL Take 2 tablets (40 mg total) by mouth daily.   QUEtiapine 25 MG tablet Commonly known as: SEROQUEL Take 1 tablet (25 mg total) by mouth at bedtime.   simvastatin 20 MG tablet Commonly known as:  ZOCOR Take 1 tablet (20 mg total) by mouth every evening.      No Known Allergies  The results of significant diagnostics from this hospitalization (including imaging, microbiology, ancillary and laboratory) are listed below for reference.    Significant Diagnostic Studies: DG Chest 1 View  Result Date: 03/31/2020 CLINICAL DATA:  Hypoxia. EXAM: CHEST  1 VIEW COMPARISON:  CTA chest head chest radiographs, 03/29/2020 FINDINGS: Heart is top-normal in size.  No mediastinal or hilar masses. Lungs are clear. The subtle infiltrate noted on the prior chest radiograph there is not evident on the current study. No pleural effusion or pneumothorax. Skeletal structures are demineralized but grossly intact. IMPRESSION: No acute cardiopulmonary disease. Electronically Signed   By: Amie Portland M.D.   On:  03/31/2020 12:02   DG Chest 2 View  Result Date: 03/29/2020 CLINICAL DATA:  Syncope EXAM: CHEST - 2 VIEW COMPARISON:  10/06/2008 FINDINGS: The lungs are well expanded and are symmetric. A focal pulmonary opacity is seen within the left mid lung zone possibly representing a small pulmonary nodule or nodular infiltrate. This is new since prior examination. Lungs are otherwise clear. No pneumothorax or pleural effusion. Cardiac size within normal limits. Pulmonary vascularity is normal. IMPRESSION: No radiographic evidence of acute cardiopulmonary disease. Focal pulmonary opacity within the left mid lung zone, new since prior examination. This could be better assessed with dedicated CT imaging. Alternatively, a follow-up chest radiograph could be obtained in 3-6 months to document stability. Electronically Signed   By: Helyn Numbers MD   On: 03/29/2020 03:50   CT HEAD WO CONTRAST  Result Date: 03/28/2020 CLINICAL DATA:  79 year old female with head trauma. EXAM: CT HEAD WITHOUT CONTRAST CT CERVICAL SPINE WITHOUT CONTRAST TECHNIQUE: Multidetector CT imaging of the head and cervical spine was performed following the standard protocol without intravenous contrast. Multiplanar CT image reconstructions of the cervical spine were also generated. COMPARISON:  CT dated 10/27/2018. FINDINGS: CT HEAD FINDINGS Brain: Moderate age-related atrophy and chronic microvascular ischemic changes. There is no acute intracranial hemorrhage. No mass effect or midline shift. No extra-axial fluid collection. Vascular: No hyperdense vessel or unexpected calcification. Skull: Normal. Negative for fracture or focal lesion. Sinuses/Orbits: No acute finding. Other: None CT CERVICAL SPINE FINDINGS Alignment: No acute subluxation. Grade 1 C5-C6 retrolisthesis. Skull base and vertebrae: No acute fracture. Osteopenia. Soft tissues and spinal canal: No prevertebral fluid or swelling. No visible canal hematoma. Disc levels: Multilevel  degenerative changes with endplate irregularity and disc space narrowing most prominent at C5-C6. Upper chest: Bilateral clusters of ground-glass nodular densities most consistent with pneumonia or aspiration. Clinical correlation is recommended. Other: Bilateral carotid bulb calcified plaques. IMPRESSION: 1. No acute intracranial pathology. Moderate age-related atrophy and chronic microvascular ischemic changes. 2. No acute/traumatic cervical spine pathology. Multilevel degenerative changes. 3. Bilateral clusters of ground-glass nodular densities most consistent with pneumonia or aspiration. Clinical correlation is recommended. Electronically Signed   By: Elgie Collard M.D.   On: 03/28/2020 18:44   CT Angio Chest PE W and/or Wo Contrast  Result Date: 03/29/2020 CLINICAL DATA:  Syncope, evaluate for PE. Abnormal chest radiograph. EXAM: CT ANGIOGRAPHY CHEST WITH CONTRAST TECHNIQUE: Multidetector CT imaging of the chest was performed using the standard protocol during bolus administration of intravenous contrast. Multiplanar CT image reconstructions and MIPs were obtained to evaluate the vascular anatomy. CONTRAST:  22mL OMNIPAQUE IOHEXOL 350 MG/ML SOLN COMPARISON:  Chest radiographs dated 03/29/2020 FINDINGS: Cardiovascular: Satisfactory opacification the bilateral pulmonary arteries to the segmental level. No evidence  of pulmonary embolism. Study is not tailored for evaluation of the thoracic aorta. No evidence of thoracic aortic aneurysm. Atherosclerotic calcifications of the aortic arch. Cardiomegaly.  No pericardial effusion. Three vessel coronary atherosclerosis. Mediastinum/Nodes: No suspicious mediastinal lymphadenopathy. Visualized thyroid is unremarkable. Lungs/Pleura: Faint ground-glass opacities/mosaic attenuation, most prominent in the right upper lobe (series 7/image 27) and left upper lobe (series 7/image 21), nonspecific but raising concern for very mild atypical infection/pneumonia. Mild  dependent atelectasis in the bilateral lower lobes. No suspicious pulmonary nodules. No pleural effusion or pneumothorax. Upper Abdomen: 3.7 cm suprarenal abdominal aortic aneurysm (series 5/image 81). Cholelithiasis. 4.8 cm left upper pole renal cyst (series 5/image 86). Suspected duodenal diverticulum (series 5/image 99), incompletely visualized. Musculoskeletal: Degenerative changes of the lower thoracic/upper lumbar spine. Review of the MIP images confirms the above findings. IMPRESSION: No evidence of pulmonary embolism. Faint ground-glass opacities, most prominent in the bilateral upper lobes, nonspecific but raising concern for very mild atypical infection/pneumonia. 3.7 cm suprarenal abdominal aortic aneurysm. Recommend follow-up ultrasound every 2 years. This recommendation follows ACR consensus guidelines: White Paper of the ACR Incidental Findings Committee II on Vascular Findings. J Am Coll Radiol 2013; 10:789-794. Aortic Atherosclerosis (ICD10-I70.0). Electronically Signed   By: Charline Bills M.D.   On: 03/29/2020 06:51   CT Cervical Spine Wo Contrast  Result Date: 03/28/2020 CLINICAL DATA:  79 year old female with head trauma. EXAM: CT HEAD WITHOUT CONTRAST CT CERVICAL SPINE WITHOUT CONTRAST TECHNIQUE: Multidetector CT imaging of the head and cervical spine was performed following the standard protocol without intravenous contrast. Multiplanar CT image reconstructions of the cervical spine were also generated. COMPARISON:  CT dated 10/27/2018. FINDINGS: CT HEAD FINDINGS Brain: Moderate age-related atrophy and chronic microvascular ischemic changes. There is no acute intracranial hemorrhage. No mass effect or midline shift. No extra-axial fluid collection. Vascular: No hyperdense vessel or unexpected calcification. Skull: Normal. Negative for fracture or focal lesion. Sinuses/Orbits: No acute finding. Other: None CT CERVICAL SPINE FINDINGS Alignment: No acute subluxation. Grade 1 C5-C6  retrolisthesis. Skull base and vertebrae: No acute fracture. Osteopenia. Soft tissues and spinal canal: No prevertebral fluid or swelling. No visible canal hematoma. Disc levels: Multilevel degenerative changes with endplate irregularity and disc space narrowing most prominent at C5-C6. Upper chest: Bilateral clusters of ground-glass nodular densities most consistent with pneumonia or aspiration. Clinical correlation is recommended. Other: Bilateral carotid bulb calcified plaques. IMPRESSION: 1. No acute intracranial pathology. Moderate age-related atrophy and chronic microvascular ischemic changes. 2. No acute/traumatic cervical spine pathology. Multilevel degenerative changes. 3. Bilateral clusters of ground-glass nodular densities most consistent with pneumonia or aspiration. Clinical correlation is recommended. Electronically Signed   By: Elgie Collard M.D.   On: 03/28/2020 18:44   ECHOCARDIOGRAM COMPLETE  Result Date: 03/30/2020    ECHOCARDIOGRAM REPORT   Patient Name:   JALIA ZUNIGA [facility] Date of Exam: 03/30/2020 Medical Rec #:  [id number]         Height:       72.0 in Accession #:    [id number]        Weight:       133.5 lb Date of Birth:  March 02, 1941          BSA:          1.794 m Patient Age:    79 years          BP:           144/48 mmHg Patient Gender: F  HR:           61 bpm. Exam Location:  Inpatient Procedure: 2D Echo, Cardiac Doppler and Color Doppler Indications:    Atrial fibrillation  History:        Patient has prior history of Echocardiogram examinations, most                 recent 10/25/2018. Arrythmias:Atrial Fibrillation,                 Signs/Symptoms:Syncope; Risk Factors:Hypertension, Dyslipidemia                 and Former Smoker. GERD. H/O CVA.  Sonographer:    Ross Ludwig RDCS (AE) Referring Phys: [id number] Claudean Severance CHOTINER IMPRESSIONS  1. Left ventricular ejection fraction, by estimation, is 65%. The left ventricle has normal function. The left ventricle has no  regional wall motion abnormalities. Left ventricular diastolic parameters are indeterminate.  2. Right ventricular systolic function is normal. The right ventricular size is normal. There is normal pulmonary artery systolic pressure. The estimated right ventricular systolic pressure is 28.4 mmHg.  3. Left atrial size was severely dilated.  4. Right atrial size was mildly dilated.  5. The mitral valve is abnormal. Trivial mitral valve regurgitation. No evidence of mitral stenosis.  6. The aortic valve is abnormal. There is moderate calcification of the aortic valve. Aortic valve regurgitation is not visualized. Aortic valve sclerosis/calcification is present, without any evidence of aortic stenosis.  7. The inferior vena cava is dilated in size with >50% respiratory variability, suggesting right atrial pressure of 8 mmHg. FINDINGS  Left Ventricle: Left ventricular ejection fraction, by estimation, is 65%. The left ventricle has normal function. The left ventricle has no regional wall motion abnormalities. The left ventricular internal cavity size was normal in size. There is no left ventricular hypertrophy. Left ventricular diastolic parameters are indeterminate. Right Ventricle: The right ventricular size is normal. No increase in right ventricular wall thickness. Right ventricular systolic function is normal. There is normal pulmonary artery systolic pressure. The tricuspid regurgitant velocity is 2.26 m/s, and  with an assumed right atrial pressure of 8 mmHg, the estimated right ventricular systolic pressure is 28.4 mmHg. Left Atrium: Left atrial size was severely dilated. Right Atrium: Right atrial size was mildly dilated. Pericardium: There is no evidence of pericardial effusion. Mitral Valve: The mitral valve is abnormal. Mild mitral annular calcification. Trivial mitral valve regurgitation. No evidence of mitral valve stenosis. Tricuspid Valve: The tricuspid valve is normal in structure. Tricuspid valve  regurgitation is mild . No evidence of tricuspid stenosis. Aortic Valve: The aortic valve is abnormal. There is moderate calcification of the aortic valve. Aortic valve regurgitation is not visualized. Mild to moderate aortic valve sclerosis/calcification is present, without any evidence of aortic stenosis. Aortic valve mean gradient measures 3.8 mmHg. Aortic valve peak gradient measures 7.0 mmHg. Aortic valve area, by VTI measures 2.05 cm. Pulmonic Valve: The pulmonic valve was normal in structure. Pulmonic valve regurgitation is not visualized. No evidence of pulmonic stenosis. Aorta: The aortic root is normal in size and structure. Venous: The inferior vena cava is dilated in size with greater than 50% respiratory variability, suggesting right atrial pressure of 8 mmHg. IAS/Shunts: No atrial level shunt detected by color flow Doppler.  LEFT VENTRICLE PLAX 2D LVIDd:         3.90 cm LVIDs:         2.60 cm LV PW:         1.50  cm LV IVS:        1.60 cm LVOT diam:     2.00 cm LV SV:         52 LV SV Index:   29 LVOT Area:     3.14 cm  RIGHT VENTRICLE            IVC RV Basal diam:  3.40 cm    IVC diam: 2.20 cm RV Mid diam:    2.80 cm RV S prime:     9.79 cm/s TAPSE (M-mode): 2.4 cm LEFT ATRIUM              Index       RIGHT ATRIUM           Index LA diam:        4.20 cm  2.34 cm/m  RA Area:     17.00 cm LA Vol (A2C):   112.0 ml 62.42 ml/m RA Volume:   37.20 ml  20.73 ml/m LA Vol (A4C):   69.2 ml  38.57 ml/m LA Biplane Vol: 87.0 ml  48.49 ml/m  AORTIC VALVE AV Area (Vmax):    1.81 cm AV Area (Vmean):   1.88 cm AV Area (VTI):     2.05 cm AV Vmax:           132.20 cm/s AV Vmean:          89.180 cm/s AV VTI:            0.254 m AV Peak Grad:      7.0 mmHg AV Mean Grad:      3.8 mmHg LVOT Vmax:         76.12 cm/s LVOT Vmean:        53.340 cm/s LVOT VTI:          0.166 m LVOT/AV VTI ratio: 0.65  AORTA Ao Root diam: 3.30 cm Ao Asc diam:  3.50 cm TRICUSPID VALVE TR Peak grad:   20.4 mmHg TR Vmax:        226.00 cm/s   SHUNTS Systemic VTI:  0.17 m Systemic Diam: 2.00 cm Weston Brass MD Electronically signed by Weston Brass MD Signature Date/Time: 03/30/2020/4:02:09 PM    Final     Microbiology: Recent Results (from the past 240 hour(s))  Urine culture     Status: Abnormal   Collection Time: 03/28/20  9:18 PM   Specimen: Urine, Random  Result Value Ref Range Status   Specimen Description URINE, RANDOM  Final   Special Requests   Final    NONE Performed at [facility], [address], [place], [place] [address]    Culture >=100,000 COLONIES/mL KLEBSIELLA PNEUMONIAE (A)  Final   Report Status 03/30/2020 FINAL  Final   Organism ID, Bacteria KLEBSIELLA PNEUMONIAE (A)  Final      Susceptibility   Klebsiella pneumoniae - MIC*    AMPICILLIN RESISTANT Resistant     CEFAZOLIN <=4 SENSITIVE Sensitive     CEFTRIAXONE <=0.25 SENSITIVE Sensitive     CIPROFLOXACIN <=0.25 SENSITIVE Sensitive     GENTAMICIN <=1 SENSITIVE Sensitive     IMIPENEM <=0.25 SENSITIVE Sensitive     NITROFURANTOIN <=16 SENSITIVE Sensitive     TRIMETH/SULFA <=20 SENSITIVE Sensitive     AMPICILLIN/SULBACTAM <=2 SENSITIVE Sensitive     PIP/TAZO <=4 SENSITIVE Sensitive     * >=100,000 COLONIES/mL KLEBSIELLA PNEUMONIAE  Respiratory Panel by RT PCR (Flu A&B, Covid) - Nasopharyngeal Swab     Status: None   Collection  Time: 03/29/20  4:48 AM   Specimen: Nasopharyngeal Swab  Result Value Ref Range Status   SARS Coronavirus 2 by RT PCR NEGATIVE NEGATIVE Final    Comment: (NOTE) SARS-CoV-2 target nucleic acids are NOT DETECTED.  The SARS-CoV-2 RNA is generally detectable in upper respiratoy specimens during the acute phase of infection. The lowest concentration of SARS-CoV-2 viral copies this assay can detect is 131 copies/mL. A negative result does not preclude SARS-Cov-2 infection and should not be used as the sole basis for treatment or other patient management decisions. A negative result may occur with  improper specimen  collection/handling, submission of specimen other than nasopharyngeal swab, presence of viral mutation(s) within the areas targeted by this assay, and inadequate number of viral copies (<131 copies/mL). A negative result must be combined with clinical observations, patient history, and epidemiological information. The expected result is Negative.  Fact Sheet for Patients:  https://www.moore.com/  Fact Sheet for Healthcare Providers:  https://www.young.biz/  This test is no t yet approved or cleared by the [place] FDA and  has been authorized for detection and/or diagnosis of SARS-CoV-2 by FDA under an Emergency Use Authorization (EUA). This EUA will remain  in effect (meaning this test can be used) for the duration of the COVID-19 declaration under Section 564(b)(1) of the Act, 21 U.S.C. section 360bbb-3(b)(1), unless the authorization is terminated or revoked sooner.     Influenza A by PCR NEGATIVE NEGATIVE Final   Influenza B by PCR NEGATIVE NEGATIVE Final    Comment: (NOTE) The Xpert Xpress SARS-CoV-2/FLU/RSV assay is intended as an aid in  the diagnosis of influenza from Nasopharyngeal swab specimens and  should not be used as a sole basis for treatment. Nasal washings and  aspirates are unacceptable for Xpert Xpress SARS-CoV-2/FLU/RSV  testing.  Fact Sheet for Patients: https://www.moore.com/  Fact Sheet for Healthcare Providers: https://www.young.biz/  This test is not yet approved or cleared by the [place] FDA and  has been authorized for detection and/or diagnosis of SARS-CoV-2 by  FDA under an Emergency Use Authorization (EUA). This EUA will remain  in effect (meaning this test can be used) for the duration of the  Covid-19 declaration under Section 564(b)(1) of the Act, 21  U.S.C. section 360bbb-3(b)(1), unless the authorization is  terminated or revoked. Performed at [facility], [address], [place], [place] [address]   Culture, blood (routine x 2) Call MD if unable to obtain prior to antibiotics being given     Status: None   Collection Time: 03/29/20  5:32 PM   Specimen: BLOOD RIGHT ARM  Result Value Ref Range Status   Specimen Description BLOOD RIGHT ARM  Final   Special Requests   Final    BOTTLES DRAWN AEROBIC AND ANAEROBIC Blood Culture adequate volume   Culture   Final    NO GROWTH 5 DAYS Performed at [facility], [address] [place], [place] [address]    Report Status 04/03/2020 FINAL  Final  Culture, blood (routine x 2) Call MD if unable to obtain prior to antibiotics being given     Status: None   Collection Time: 03/29/20  5:32 PM   Specimen: BLOOD LEFT ARM  Result Value Ref Range Status   Specimen Description BLOOD LEFT ARM  Final   Special Requests   Final    BOTTLES DRAWN AEROBIC AND ANAEROBIC Blood Culture adequate volume   Culture   Final    NO GROWTH 5 DAYS Performed  at [facility], [address], [place], [place] [address]    Report Status 04/03/2020 FINAL  Final     Labs: Basic Metabolic Panel: Recent Labs  Lab 03/28/20 1641 03/29/20 0926 03/30/20 0348  NA 141 139 137  K 3.3* 3.5 3.8  CL 106 104 105  CO2 23 26 26   GLUCOSE 128* 139* 105*  BUN 24* 15 16  CREATININE 0.84 0.55 0.63  CALCIUM 9.2 9.1 8.9  MG  --  1.9  --    Liver Function Tests: No results for input(s): AST, ALT, ALKPHOS, BILITOT, PROT, ALBUMIN in the last 168 hours. No results for input(s): LIPASE, AMYLASE in the last 168 hours. No results for input(s): AMMONIA in the last 168 hours. CBC: Recent Labs  Lab 03/28/20 1641 03/30/20 0348  WBC 5.0 5.2  HGB 11.9* 11.3*  HCT 37.9 35.1*  MCV 95.0 92.4  PLT 240 206   Cardiac Enzymes: No results for input(s): CKTOTAL, CKMB, CKMBINDEX, TROPONINI in the last 168 hours. BNP: BNP (last 3 results) No results for input(s): BNP in the last 8760 hours.  ProBNP (last 3  results) No results for input(s): PROBNP in the last 8760 hours.  CBG: Recent Labs  Lab 03/31/20 0711 03/31/20 1651 04/01/20 0739 04/02/20 0437 04/03/20 0554  GLUCAP 75 93 104* 94 124*    Principal Problem:   Syncope Active Problems:   HTN (hypertension)   Hypokalemia   Atrial fibrillation, chronic (HCC)   Time coordinating discharge: 38 minutes  Signed:        Daylan Boggess, DO [facility]  04/03/2020, 3:30 PM

## 2020-04-03 NOTE — Progress Notes (Signed)
PROGRESS NOTE  Tammy Navarro EXH:[id number] DOB: April 25, 1941 DOA: 03/28/2020 PCP: Gaspar Garbe, MD  Brief History   Ms Essner is a 79 yr old woman who carries a past medical history significant for hypertension, stroke, subdural hematoma, subarachnoid hemorrhage, and atrial fibrillation. She presented to [facility] on 03/28/2020 following a syncopal episode. The patient states that she does not know what happened. She apparently was waiting for a taxi when she fell backward and passed out as per the report given by her son. On admission she denied chest pain, heaviness, tightness, or chest pressure. She stated that she did not know she was going to pass out. This morning she states that she doesn't remember anything. She apparently had no recent illnesses or COVID exposures.  In the ED she was found to have atrial fibrillation and mild hypokalemia. CT of her head and neck demonstrated showed no acute abnormalities. CTA of the chest demonstrated no pulmonary embolus, but did show ground glass opacities in the bilateral upper lobes that were concerning for a mild atypical pneumonia.  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 There is also aortic atherosclerosis. UA is positive for UTI. She is found to be infested with bedbugs.  [facility] were consulted to admit the patient for further evaluation and care. She was admitted by my colleague, Dr. Rachael Darby earlier this morning. The patient is being roomed in the ED awaiting a bed upstairs. She is receiving IV Rocephin and Azithromycin. Her potassium has been supplemented and is now replete. She is being monitored on telemetry and echocardiogram demonstrates an EF of 65% with normal function, no regional wall motion abnormalities, and indeterminate diastolic parameters. Lt atrial size is severely dilated. Rt atrial size is mildly dilated. Normal RV function and size. Normal  pulmonaryartery systolic pressure.   She has been evaluated by PT/OT. Their recommendation is for SNF placement. TOC has been consulted, and placement has been found. However, the patient is refusing to go. As previously stated by this Clinical research associate, this patient lacks the capacity to make her own decisions due to her inability to appreciate her medical needs, her physical condition, and what she needs. Psychiatry has been consulted to assist with determining the patient's ability to make her own decisions.  Consultants  . Psychiatry  Procedures  . None  Antibiotics   Anti-infectives (From admission, onward)   Start     Dose/Rate Route Frequency Ordered Stop   03/29/20 1400  cefTRIAXone (ROCEPHIN) 2 g in sodium chloride 0.9 % 100 mL IVPB        2 g 200 mL/hr over 30 Minutes Intravenous Every 24 hours 03/29/20 1332 04/02/20 2059   03/29/20 1400  azithromycin (ZITHROMAX) 500 mg in sodium chloride 0.9 % 250 mL IVPB        500 mg 250 mL/hr over 60 Minutes Intravenous Every 24 hours 03/29/20 1332 04/02/20 2059     .   Subjective  The patient is resting comfortably. No new complaints.  Objective   Vitals:  Vitals:   04/03/20 0551 04/03/20 1129  BP: 131/89 134/74  Pulse: 80 86  Resp: 18 15  Temp: 98.8 F (37.1 C) 98.9 F (37.2 C)  SpO2: 95% 98%   Exam:  Constitutional:  . The patient is awake and alert. No acute distress. She appears elderly, weak, and frail. She is confused. She does not know where she is, the date, why she is  in the hospital, or why she needs to take her medications. Respiratory:  . No increased work of breathing. . No wheezes, rales, or rhonchi . No tactile fremitus Cardiovascular:  . Regular rate and rhythm . No murmurs, ectopy, or gallups. . No lateral PMI. No thrills. Abdomen:  . Abdomen is soft, non-tender, non-distended . No hernias, masses, or organomegaly . Normoactive bowel sounds.  Musculoskeletal:  . No cyanosis, clubbing, or edema Skin:   . No rashes, lesions, ulcers . palpation of skin: no induration or nodules Neurologic:  . Unable to evaluate due to the patient's inability to cooperate with exam. Psychiatric:  . Unable to evaluate due to the patient's inability to cooperate with exam. Pt is confused and nor oriented.  I have personally reviewed the following:   Today's Data  . Dispensing optician  . Urine culture from 03/28/2020 is positive for klebsiella pneumonia. Sensitive to all except ampicillin. . Blood culture x 2 has had no growth.  Cardiology Data  . Echocardiogram: Unremarkable.  Scheduled Meds: . amLODipine  5 mg Oral Daily  . aspirin  81 mg Oral Daily  . lisinopril  40 mg Oral Daily  . simvastatin  20 mg Oral QPM  . sodium chloride flush  3 mL Intravenous Q12H   Continuous Infusions: . sodium chloride 10 mL/hr at 04/02/20 0315    Principal Problem:   Syncope Active Problems:   HTN (hypertension)   Hypokalemia   Atrial fibrillation, chronic (HCC)   LOS: 4 days   Syncope: DDx: 1)Cardiac: Possibly related to atrial fibrillation although there has been no rapid rate recorded on telemetry or EKG. Rate has been between 49 and 69 since admission. Echocardiogram was unremarkable. 2) Bronchiolitis/Bronchiectasis - could contribute to vasovagal syncope. 3) Hypokalemia: Could have possibly contributed. At the present time I believe that the event was due to dehydration and vasovagal syncope related to cough from bronchiolitis particularly in the setting of hypokalemia.  Hypoxia: The patient had some hypoxia overnight. CXR demonstrates no acute pathology. Suspect that the patient may have had some bronchial plugging that has now cleared. She is currently saturating 99% on room air.  Delirium/dementia: The patient has been agitated and confused. She receives haldol as necessary for safety. I do not feel that this patient is capable of appreciating her medical situation or making decisions regarding her  medical situation. Her son, Theodoro Grist, is in agreement. Plan is for the patient to go to SNF at discharge. Psychiatry has been consulted assist with determining patient's ability to make decisions. The patient's son has been advised to seek guardianship for her.  Debility: The patient has been evaluated by PT/OT. She requires assistance for mobility in bed, transfers, and ambulation with a rolling walker. She is unsure on her feet. PT feels that she is appropriate for SNF. Her son, Theodoro Grist, who has spoken to SW regarding the patient's poor living conditions agrees that the patient should not return to her home at discharge. The patient's other three sons apparently have substance abuse issues and take advantage of the patient in financial terms and are not helpful in terms of improving her living conditions. She is not capable of appreciating her circumstances or acting in her own best interests.  Hypertension: The patient is normotensive on norvasc and lisinopril.  Hypokalemia: 2.9 on admission. Resolved. Will monitor.  Atrial fibrillation: Rate controlled. Unable to determine if patient is on anticoagulation at home. She is unable to held with medication reconciliation. Her sons say  that she doesn't keep her meds in bottles, so they have no idea what she is taking. Monitor on telemetry.  I have seen and examined this patient myself. I have spent 34 minutes in her evaluation and care.  DVT Prophylaxis: SCD's CODE STATUS: Full Code Family Communication:None available. [phone] Disposition:  Status is: Inpatient  Remains inpatient appropriate because:Unsafe d/c plan   Dispo: The patient is from: Home              Anticipated d/c is to: SNF              Anticipated d/c date is: 2 days              Patient currently is not medically stable to d/c. Psychiatry consulted to determine decisionality.  Deysy Schabel, DO [facility] Direct contact: see [email or web address]  7PM-7AM contact night  coverage as above 04/03/2020, 2:42 PM  LOS: 0 days

## 2020-04-03 NOTE — Progress Notes (Signed)
CSW is now agreeable to SNF placement. Waiting to hear back from [facility].

## 2020-04-03 NOTE — Progress Notes (Signed)
CSW spoke with pt about placement at SNF for rehab purposes, pt adamant about going home. CSW tried to ask pt if home was safe, pt stated it was. CSW asked pt about bed bugs, pt stated she was in the process of getting an exterminator. CSW tried to explain that it didn't sound like her home was safe and our goal was make sure she had a safe dc plan. Pt stated she will not go to a SNF. CSW spoke with Thelma Barge (relative of pt) via phone [phone]. Thelma Barge stated that pt's living conditions were poor and home was not safe. 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 Thelma Barge stated that Onalee Hua has called APS multiple times and each time the pt runs them off. Thelma Barge stated she just spoke with the pt and she was upset about the news of going to a SNF. Thelma Barge stated that pt stated she would walk out of the hospital before going to SNF. CSW asked Thelma Barge if pt could actually walk, Thelma Barge stated pt could walk a little but would more than likely fall before getting the hospital. CSW called and made an APS report and notified MD.

## 2020-04-03 NOTE — Progress Notes (Signed)
Physical Therapy Treatment Patient Details Name: Tammy Navarro MRN: [id number] DOB: Oct 30, 1940 Today's Date: 04/03/2020    History of Present Illness Tammy Navarro is a 79 yr old woman who carries a past medical history significant for hypertension, stroke, subdural hematoma, subarachnoid hemorrhage, and atrial fibrillation. She presented to [facility] on 03/28/2020 following a syncopal episode.     PT Comments    Pt with multiple questions throughout day regarding d/c plans; pt receptive and agreeable to subacute rehab following education regarding need for additional PT; pt receptive to supine therex; pt found soiled and educated on need to get into chair for linens to be changed; pt cleaned with new gown; pt requiring min A due to poor balance, strength and coordination with transfer; pt will benefit from skilled PT to address deficits in balance, strength, coordination, gait, endurance and safety to maximize independence with functional mobility prior ot discharge.    Follow Up Recommendations  SNF     Equipment Recommendations  None recommended by PT    Recommendations for Other Services       Precautions / Restrictions Precautions Precautions: Fall Precaution Comments: contact precautions for bedbugs Restrictions Weight Bearing Restrictions: No    Mobility  Bed Mobility Overal bed mobility: Modified Independent Bed Mobility: Supine to Sit Modified independent (Device/Increase time)         General bed mobility comments: required increased time and use of bedrails  Transfers Overall transfer level: Needs assistance Equipment used: Rolling walker (2 wheeled) Transfers: Sit to/from [employer] Transfers   Stand pivot transfers: Min guard          Ambulation/Gait                 Stairs             Wheelchair Mobility    Modified Rankin (Stroke Patients Only)       Balance                                             Cognition                                              Exercises General Exercises - Lower Extremity Heel Slides: AROM;Both;20 reps;Supine Hip ABduction/ADduction: AROM;Both;20 reps;Supine Straight Leg Raises: AROM;Both;20 reps;Supine    General Comments General comments (skin integrity, edema, etc.): increased time spent educating pt on recommendation to go to SNF for additional therapy to improve strength, balance and independence prior to discharging home; pt with questions regarding therapy but agreeable and verbalized understanding of recommendation      Pertinent Vitals/Pain      Home Living                      Prior Function            PT Goals (current goals can now be found in the care plan section) Acute Rehab PT Goals Patient Stated Goal: I would like to see the sun PT Goal Formulation: With patient Time For Goal Achievement: 04/13/20 Potential to Achieve Goals: Good Progress towards PT goals: Progressing toward goals    Frequency    Min 2X/week      PT Plan Current plan remains appropriate  Co-evaluation              AM-PAC PT "6 Clicks" Mobility   Outcome Measure  Help needed turning from your back to your side while in a flat bed without using bedrails?: None Help needed moving from lying on your back to sitting on the side of a flat bed without using bedrails?: A Little Help needed moving to and from a bed to a chair (including a wheelchair)?: A Little Help needed standing up from a chair using your arms (e.g., wheelchair or bedside chair)?: A Little Help needed to walk in hospital room?: A Little Help needed climbing 3-5 steps with a railing? : A Lot 6 Click Score: 18    End of Session Equipment Utilized During Treatment: Gait belt Activity Tolerance: Patient tolerated treatment well Patient left: in chair;with call bell/phone within reach;with chair alarm set Nurse Communication: Mobility status PT Visit  Diagnosis: Unsteadiness on feet (R26.81);Muscle weakness (generalized) (M62.81)     Time: 4037-0964 PT Time Calculation (min) (ACUTE ONLY): 17 min  Charges:  $Therapeutic Exercise: 8-22 mins                     Tammy Navarro, DPT Acute Rehabilitation Services [phone]   Tammy Navarro 04/03/2020, 2:33 PM

## 2020-04-04 DIAGNOSIS — R55 Syncope and collapse: Principal | ICD-10-CM

## 2020-04-04 DIAGNOSIS — I482 Chronic atrial fibrillation, unspecified: Secondary | ICD-10-CM

## 2020-04-04 LAB — GLUCOSE, CAPILLARY: Glucose-Capillary: 92 mg/dL (ref 70–99)

## 2020-04-04 NOTE — Progress Notes (Signed)
Occupational Therapy Treatment Patient Details Name: Tammy Navarro MRN: [id number] DOB: 05/11/1941 Today's Date: 04/04/2020    History of present illness Ms Chargois is a 79 yr old woman who carries a past medical history significant for hypertension, stroke, subdural hematoma, subarachnoid hemorrhage, and atrial fibrillation. She presented to [facility] on 03/28/2020 following a syncopal episode.    OT comments  Patient scheduled for discharge this date.  Patient seen for ADL, functional mobility and toilet skills.  Generalized weakness, decreased balance and activity tolerance continue to impact ADL and mobility status, see below for status.  She feels better, and is looking forward to continued rehab at [facility].  OT will continue to follow in the acute setting if she remains.  SNF discharge remains appropriate.    Follow Up Recommendations  SNF;Supervision/Assistance - 24 hour    Equipment Recommendations  3 in 1 bedside commode    Recommendations for Other Services      Precautions / Restrictions Precautions Precautions: Fall Precaution Comments: contact precautions for bedbugs       Mobility Bed Mobility Overal bed mobility: Modified Independent Bed Mobility: Supine to Sit     Supine to sit: Modified independent (Device/Increase time);HOB elevated        Transfers Overall transfer level: Needs assistance Equipment used: Rolling walker (2 wheeled) Transfers: Sit to/from Stand Sit to Stand: Min assist                                                         ADL either performed or assessed with clinical judgement   ADL Overall ADL's : Needs assistance/impaired     Grooming: Wash/dry hands;Wash/dry face;Sitting;Set up   Upper Body Bathing: Set up;Sitting   Lower Body Bathing: Min guard;Sit to/from stand   Upper Body Dressing : Minimal assistance;Sitting   Lower Body Dressing: Min guard;Sit to/from stand   Toilet Transfer: Minimal  assistance;Ambulation;Regular Social worker and Hygiene: Min guard;Sit to/from stand       Functional mobility during ADLs: Health and safety inspector     Praxis      Cognition Arousal/Alertness: Awake/alert Behavior During Therapy: WFL for tasks assessed/performed Overall Cognitive Status: Within Functional Limits for tasks assessed                                                      General Comments      Pertinent Vitals/ Pain       Pain Assessment: No/denies pain                                                          Frequency  Min 2X/week        Progress Toward Goals  OT Goals(current goals can now be found in the care plan section)  Progress towards OT goals: Progressing toward goals  Acute Rehab OT Goals Patient Stated Goal: I would like  to walk better Time For Goal Achievement: 04/14/20 Potential to Achieve Goals: Good  Plan Discharge plan remains appropriate    Co-evaluation                 AM-PAC OT "6 Clicks" Daily Activity     Outcome Measure   Help from another person eating meals?: None Help from another person taking care of personal grooming?: None Help from another person toileting, which includes using toliet, bedpan, or urinal?: A Little Help from another person bathing (including washing, rinsing, drying)?: A Little Help from another person to put on and taking off regular upper body clothing?: A Little Help from another person to put on and taking off regular lower body clothing?: A Little 6 Click Score: 20    End of Session Equipment Utilized During Treatment: Rolling walker  OT Visit Diagnosis: Unsteadiness on feet (R26.81);Muscle weakness (generalized) (M62.81)   Activity Tolerance Patient tolerated treatment well   Patient Left with call bell/phone within reach;in bed;with bed alarm set   Nurse Communication           Time: 2330-0762 OT Time Calculation (min): 25 min  Charges: OT General Charges $OT Visit: 1 Visit OT Treatments $Self Care/Home Management : 23-37 mins  04/04/2020  Rich, OTR/L  Acute Rehabilitation Services  Office:  [phone]    Tammy Navarro 04/04/2020, 11:28 AM

## 2020-04-04 NOTE — Discharge Summary (Signed)
Physician Discharge Summary  Tammy Navarro ZDG:[id number] DOB: 13-Feb-1941 DOA: 03/28/2020  PCP: Gaspar Garbe, MD  Admit date: 03/28/2020 Discharge date: 04/04/2020  Recommendations for Outpatient Follow-up:  1. Discharge to SNF for rehab 2. Follow up with PCP in 7-10 days after discharge from facility  Discharge Diagnoses: Principal diagnosis is #1 1. Syncope due to vasavagal syncope 2. Bronchilotis, bronchiectasis 3. Hypoxia due to bronchitis 4. Delirium in the setting of dementia  5. Debility 6. Hypertension 7. Hypokalemia: Resolved. 8. Atrial fibrillation with controlled rate 9. Lack of capacity for medical decision making 10. Unable to care for self. 11. Bed Bug infestation  Discharge Condition: Fair  Disposition: SNF  Diet recommendation: Regular  Filed Weights   04/02/20 0057 04/03/20 0551 04/04/20 0410  Weight: 61.7 kg 61.2 kg 62.1 kg    History of present illness: Tammy Navarro is a 79 y.o. female with medical history significant for hypertension, stroke, subdural hematoma, subarachnoid hemorrhage, atrial fibrillation and comes in following a syncopal episode. Apparently, she fell backward while waiting for a taxi. She denies any injury and actually does not remember having a syncopal episode. She was with her son at the time of the event. She states she did not have any palpitations, chest pain or warning that she was going to pass out. She states she has not had any recent head injury or trauma.She denies chest pain, heaviness, tightness, pressure. She denies dizziness or lightheadedness. She denies palpitations. She is not on any anticoagulants. She has not been vaccinated against COVID-19. He states she has not had any recent illnesses and has had no known exposures to COVID-19. At this time she states that she feels fine.  ED Course: Patient was found to have atrial fibrillation on EKG and a mildly decreased potassium level on labs. CT of her  head and neck showed no acute abnormalities. It was noted that she had some bilateral opacities in her upper lungs on the neck CT which could be better visualized with a dedicated chest CT. CT angiography of her chest has been ordered in the emergency room and is pending. Patient was found to have bedbugs while in the emergency room and was placed in contact precautions.   Hospital Course: Tammy Navarro is a 79 yr old woman who carries a past medical history significant forhypertension, stroke, subdural hematoma, subarachnoid hemorrhage,andatrial fibrillation. She presented to [facility] on 03/28/2020 following a syncopal episode. The patient states that she does not know what happened. She apparently was waiting for a taxi when she fell backward and passed out as per the report given by her son. On admission she denied chest pain, heaviness, tightness, or chest pressure. She stated that she did not know she was going to pass out. This morning she states that she doesn't remember anything. She apparently had no recent illnesses or COVID exposures.  In the ED she was found to have atrial fibrillation and mild hypokalemia. CT of her head and neck demonstrated showed no acute abnormalities. CTA of the chest demonstrated no pulmonary embolus, but did show ground glass opacities in the bilateral upper lobes that were concerning for a mild atypical pneumonia. 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 There is also aortic atherosclerosis. UA is positive for UTI. She is found to be infested with bedbugs.  [facility] were consulted to admit the patient for further evaluation and care. She was admitted by my  colleague, Dr. Rachael Darby earlier this morning. The patient is being roomed in the ED awaiting a bed upstairs. She is receiving IV Rocephin and Azithromycin. Her potassium has been supplemented and is now replete. She is being monitored  on telemetry and echocardiogram demonstrates an EF of 65% with normal function, no regional wall motion abnormalities, and indeterminate diastolic parameters. Lt atrial size is severely dilated. Rt atrial size is mildly dilated. Normal RV function and size. Normal pulmonaryartery systolic pressure.   She has been evaluated by PT/OT. Their recommendation is for SNF placement. TOC has been consulted, and placement has been found. The patient is currently agreeing to go to SNF.   Today's assessment: S: The patient is resting comfortably. No new complaints. O: Vitals:  Vitals:   04/03/20 2101 04/04/20 0410  BP: 137/87 (!) 148/90  Pulse: 75 82  Resp: 17 18  Temp: 99.6 F (37.6 C) 97.7 F (36.5 C)  SpO2: 97% 97%   Exam:  Constitutional:   The patient is awake and alert. No acute distress. She appears elderly, weak, and frail. She is confused. She does not know where she is, the date, why she is in the hospital, or why she needs to take her medications. Respiratory:   No increased work of breathing.  No wheezes, rales, or rhonchi  No tactile fremitus Cardiovascular:   Regular rate and rhythm  No murmurs, ectopy, or gallups.  No lateral PMI. No thrills. Abdomen:   Abdomen is soft, non-tender, non-distended  No hernias, masses, or organomegaly  Normoactive bowel sounds.  Musculoskeletal:   No cyanosis, clubbing, or edema Skin:   No rashes, lesions, ulcers  palpation of skin: no induration or nodules Neurologic:   Unable to evaluate due to the patient's inability to cooperate with exam. Psychiatric:   Unable to evaluate due to the patient's inability to cooperate with exam. Pt is confused and not oriented.  Discharge Instructions  Discharge Instructions    Activity as tolerated - No restrictions   Complete by: As directed    Call MD for:  difficulty breathing, headache or visual disturbances   Complete by: As directed    Call MD for:  temperature >100.4    Complete by: As directed    Diet general   Complete by: As directed    Discharge instructions   Complete by: As directed    Discharge to SNF for rehab Follow up with PCP in 7-10 days after discharge from facility   Increase activity slowly   Complete by: As directed      Allergies as of 04/04/2020   No Known Allergies     Medication List    STOP taking these medications   atenolol 100 MG tablet Commonly known as: TENORMIN   loperamide 2 MG tablet Commonly known as: Imodium A-D   multivitamin Liqd   omeprazole 20 MG capsule Commonly known as: PRILOSEC   ondansetron 8 MG disintegrating tablet Commonly known as: ZOFRAN-ODT   sodium chloride 1 g tablet     TAKE these medications   amLODipine 5 MG tablet Commonly known as: NORVASC Take 1 tablet (5 mg total) by mouth daily.   aspirin 81 MG chewable tablet Chew 81 mg by mouth daily.   lisinopril 20 MG tablet Commonly known as: ZESTRIL Take 2 tablets (40 mg total) by mouth daily.   QUEtiapine 25 MG tablet Commonly known as: SEROQUEL Take 1 tablet (25 mg total) by mouth at bedtime.   simvastatin 20 MG tablet Commonly known  as: ZOCOR Take 1 tablet (20 mg total) by mouth every evening.      No Known Allergies  The results of significant diagnostics from this hospitalization (including imaging, microbiology, ancillary and laboratory) are listed below for reference.    Significant Diagnostic Studies: DG Chest 1 View  Result Date: 03/31/2020 CLINICAL DATA:  Hypoxia. EXAM: CHEST  1 VIEW COMPARISON:  CTA chest head chest radiographs, 03/29/2020 FINDINGS: Heart is top-normal in size.  No mediastinal or hilar masses. Lungs are clear. The subtle infiltrate noted on the prior chest radiograph there is not evident on the current study. No pleural effusion or pneumothorax. Skeletal structures are demineralized but grossly intact. IMPRESSION: No acute cardiopulmonary disease. Electronically Signed   By: Amie Portland M.D.    On: 03/31/2020 12:02   DG Chest 2 View  Result Date: 03/29/2020 CLINICAL DATA:  Syncope EXAM: CHEST - 2 VIEW COMPARISON:  10/06/2008 FINDINGS: The lungs are well expanded and are symmetric. A focal pulmonary opacity is seen within the left mid lung zone possibly representing a small pulmonary nodule or nodular infiltrate. This is new since prior examination. Lungs are otherwise clear. No pneumothorax or pleural effusion. Cardiac size within normal limits. Pulmonary vascularity is normal. IMPRESSION: No radiographic evidence of acute cardiopulmonary disease. Focal pulmonary opacity within the left mid lung zone, new since prior examination. This could be better assessed with dedicated CT imaging. Alternatively, a follow-up chest radiograph could be obtained in 3-6 months to document stability. Electronically Signed   By: Helyn Numbers MD   On: 03/29/2020 03:50   CT HEAD WO CONTRAST  Result Date: 03/28/2020 CLINICAL DATA:  79 year old female with head trauma. EXAM: CT HEAD WITHOUT CONTRAST CT CERVICAL SPINE WITHOUT CONTRAST TECHNIQUE: Multidetector CT imaging of the head and cervical spine was performed following the standard protocol without intravenous contrast. Multiplanar CT image reconstructions of the cervical spine were also generated. COMPARISON:  CT dated 10/27/2018. FINDINGS: CT HEAD FINDINGS Brain: Moderate age-related atrophy and chronic microvascular ischemic changes. There is no acute intracranial hemorrhage. No mass effect or midline shift. No extra-axial fluid collection. Vascular: No hyperdense vessel or unexpected calcification. Skull: Normal. Negative for fracture or focal lesion. Sinuses/Orbits: No acute finding. Other: None CT CERVICAL SPINE FINDINGS Alignment: No acute subluxation. Grade 1 C5-C6 retrolisthesis. Skull base and vertebrae: No acute fracture. Osteopenia. Soft tissues and spinal canal: No prevertebral fluid or swelling. No visible canal hematoma. Disc levels: Multilevel  degenerative changes with endplate irregularity and disc space narrowing most prominent at C5-C6. Upper chest: Bilateral clusters of ground-glass nodular densities most consistent with pneumonia or aspiration. Clinical correlation is recommended. Other: Bilateral carotid bulb calcified plaques. IMPRESSION: 1. No acute intracranial pathology. Moderate age-related atrophy and chronic microvascular ischemic changes. 2. No acute/traumatic cervical spine pathology. Multilevel degenerative changes. 3. Bilateral clusters of ground-glass nodular densities most consistent with pneumonia or aspiration. Clinical correlation is recommended. Electronically Signed   By: Elgie Collard M.D.   On: 03/28/2020 18:44   CT Angio Chest PE W and/or Wo Contrast  Result Date: 03/29/2020 CLINICAL DATA:  Syncope, evaluate for PE. Abnormal chest radiograph. EXAM: CT ANGIOGRAPHY CHEST WITH CONTRAST TECHNIQUE: Multidetector CT imaging of the chest was performed using the standard protocol during bolus administration of intravenous contrast. Multiplanar CT image reconstructions and MIPs were obtained to evaluate the vascular anatomy. CONTRAST:  75mL OMNIPAQUE IOHEXOL 350 MG/ML SOLN COMPARISON:  Chest radiographs dated 03/29/2020 FINDINGS: Cardiovascular: Satisfactory opacification the bilateral pulmonary arteries to the segmental level. No  evidence of pulmonary embolism. Study is not tailored for evaluation of the thoracic aorta. No evidence of thoracic aortic aneurysm. Atherosclerotic calcifications of the aortic arch. Cardiomegaly.  No pericardial effusion. Three vessel coronary atherosclerosis. Mediastinum/Nodes: No suspicious mediastinal lymphadenopathy. Visualized thyroid is unremarkable. Lungs/Pleura: Faint ground-glass opacities/mosaic attenuation, most prominent in the right upper lobe (series 7/image 27) and left upper lobe (series 7/image 21), nonspecific but raising concern for very mild atypical infection/pneumonia. Mild  dependent atelectasis in the bilateral lower lobes. No suspicious pulmonary nodules. No pleural effusion or pneumothorax. Upper Abdomen: 3.7 cm suprarenal abdominal aortic aneurysm (series 5/image 81). Cholelithiasis. 4.8 cm left upper pole renal cyst (series 5/image 86). Suspected duodenal diverticulum (series 5/image 99), incompletely visualized. Musculoskeletal: Degenerative changes of the lower thoracic/upper lumbar spine. Review of the MIP images confirms the above findings. IMPRESSION: No evidence of pulmonary embolism. Faint ground-glass opacities, most prominent in the bilateral upper lobes, nonspecific but raising concern for very mild atypical infection/pneumonia. 3.7 cm suprarenal abdominal aortic aneurysm. Recommend follow-up ultrasound every 2 years. This recommendation follows ACR consensus guidelines: White Paper of the ACR Incidental Findings Committee II on Vascular Findings. J Am Coll Radiol 2013; 10:789-794. Aortic Atherosclerosis (ICD10-I70.0). Electronically Signed   By: Charline Bills M.D.   On: 03/29/2020 06:51   CT Cervical Spine Wo Contrast  Result Date: 03/28/2020 CLINICAL DATA:  79 year old female with head trauma. EXAM: CT HEAD WITHOUT CONTRAST CT CERVICAL SPINE WITHOUT CONTRAST TECHNIQUE: Multidetector CT imaging of the head and cervical spine was performed following the standard protocol without intravenous contrast. Multiplanar CT image reconstructions of the cervical spine were also generated. COMPARISON:  CT dated 10/27/2018. FINDINGS: CT HEAD FINDINGS Brain: Moderate age-related atrophy and chronic microvascular ischemic changes. There is no acute intracranial hemorrhage. No mass effect or midline shift. No extra-axial fluid collection. Vascular: No hyperdense vessel or unexpected calcification. Skull: Normal. Negative for fracture or focal lesion. Sinuses/Orbits: No acute finding. Other: None CT CERVICAL SPINE FINDINGS Alignment: No acute subluxation. Grade 1 C5-C6  retrolisthesis. Skull base and vertebrae: No acute fracture. Osteopenia. Soft tissues and spinal canal: No prevertebral fluid or swelling. No visible canal hematoma. Disc levels: Multilevel degenerative changes with endplate irregularity and disc space narrowing most prominent at C5-C6. Upper chest: Bilateral clusters of ground-glass nodular densities most consistent with pneumonia or aspiration. Clinical correlation is recommended. Other: Bilateral carotid bulb calcified plaques. IMPRESSION: 1. No acute intracranial pathology. Moderate age-related atrophy and chronic microvascular ischemic changes. 2. No acute/traumatic cervical spine pathology. Multilevel degenerative changes. 3. Bilateral clusters of ground-glass nodular densities most consistent with pneumonia or aspiration. Clinical correlation is recommended. Electronically Signed   By: Elgie Collard M.D.   On: 03/28/2020 18:44   ECHOCARDIOGRAM COMPLETE  Result Date: 03/30/2020    ECHOCARDIOGRAM REPORT   Patient Name:   WILLELLA HARDING [facility] Date of Exam: 03/30/2020 Medical Rec #:  [id number]         Height:       72.0 in Accession #:    [id number]        Weight:       133.5 lb Date of Birth:  Sep 17, 1940          BSA:          1.794 m Patient Age:    79 years          BP:           144/48 mmHg Patient Gender: F  HR:           61 bpm. Exam Location:  Inpatient Procedure: 2D Echo, Cardiac Doppler and Color Doppler Indications:    Atrial fibrillation  History:        Patient has prior history of Echocardiogram examinations, most                 recent 10/25/2018. Arrythmias:Atrial Fibrillation,                 Signs/Symptoms:Syncope; Risk Factors:Hypertension, Dyslipidemia                 and Former Smoker. GERD. H/O CVA.  Sonographer:    Ross Ludwig RDCS (AE) Referring Phys: [id number] Claudean Severance CHOTINER IMPRESSIONS  1. Left ventricular ejection fraction, by estimation, is 65%. The left ventricle has normal function. The left ventricle has no  regional wall motion abnormalities. Left ventricular diastolic parameters are indeterminate.  2. Right ventricular systolic function is normal. The right ventricular size is normal. There is normal pulmonary artery systolic pressure. The estimated right ventricular systolic pressure is 28.4 mmHg.  3. Left atrial size was severely dilated.  4. Right atrial size was mildly dilated.  5. The mitral valve is abnormal. Trivial mitral valve regurgitation. No evidence of mitral stenosis.  6. The aortic valve is abnormal. There is moderate calcification of the aortic valve. Aortic valve regurgitation is not visualized. Aortic valve sclerosis/calcification is present, without any evidence of aortic stenosis.  7. The inferior vena cava is dilated in size with >50% respiratory variability, suggesting right atrial pressure of 8 mmHg. FINDINGS  Left Ventricle: Left ventricular ejection fraction, by estimation, is 65%. The left ventricle has normal function. The left ventricle has no regional wall motion abnormalities. The left ventricular internal cavity size was normal in size. There is no left ventricular hypertrophy. Left ventricular diastolic parameters are indeterminate. Right Ventricle: The right ventricular size is normal. No increase in right ventricular wall thickness. Right ventricular systolic function is normal. There is normal pulmonary artery systolic pressure. The tricuspid regurgitant velocity is 2.26 m/s, and  with an assumed right atrial pressure of 8 mmHg, the estimated right ventricular systolic pressure is 28.4 mmHg. Left Atrium: Left atrial size was severely dilated. Right Atrium: Right atrial size was mildly dilated. Pericardium: There is no evidence of pericardial effusion. Mitral Valve: The mitral valve is abnormal. Mild mitral annular calcification. Trivial mitral valve regurgitation. No evidence of mitral valve stenosis. Tricuspid Valve: The tricuspid valve is normal in structure. Tricuspid valve  regurgitation is mild . No evidence of tricuspid stenosis. Aortic Valve: The aortic valve is abnormal. There is moderate calcification of the aortic valve. Aortic valve regurgitation is not visualized. Mild to moderate aortic valve sclerosis/calcification is present, without any evidence of aortic stenosis. Aortic valve mean gradient measures 3.8 mmHg. Aortic valve peak gradient measures 7.0 mmHg. Aortic valve area, by VTI measures 2.05 cm. Pulmonic Valve: The pulmonic valve was normal in structure. Pulmonic valve regurgitation is not visualized. No evidence of pulmonic stenosis. Aorta: The aortic root is normal in size and structure. Venous: The inferior vena cava is dilated in size with greater than 50% respiratory variability, suggesting right atrial pressure of 8 mmHg. IAS/Shunts: No atrial level shunt detected by color flow Doppler.  LEFT VENTRICLE PLAX 2D LVIDd:         3.90 cm LVIDs:         2.60 cm LV PW:         1.50  cm LV IVS:        1.60 cm LVOT diam:     2.00 cm LV SV:         52 LV SV Index:   29 LVOT Area:     3.14 cm  RIGHT VENTRICLE            IVC RV Basal diam:  3.40 cm    IVC diam: 2.20 cm RV Mid diam:    2.80 cm RV S prime:     9.79 cm/s TAPSE (M-mode): 2.4 cm LEFT ATRIUM              Index       RIGHT ATRIUM           Index LA diam:        4.20 cm  2.34 cm/m  RA Area:     17.00 cm LA Vol (A2C):   112.0 ml 62.42 ml/m RA Volume:   37.20 ml  20.73 ml/m LA Vol (A4C):   69.2 ml  38.57 ml/m LA Biplane Vol: 87.0 ml  48.49 ml/m  AORTIC VALVE AV Area (Vmax):    1.81 cm AV Area (Vmean):   1.88 cm AV Area (VTI):     2.05 cm AV Vmax:           132.20 cm/s AV Vmean:          89.180 cm/s AV VTI:            0.254 m AV Peak Grad:      7.0 mmHg AV Mean Grad:      3.8 mmHg LVOT Vmax:         76.12 cm/s LVOT Vmean:        53.340 cm/s LVOT VTI:          0.166 m LVOT/AV VTI ratio: 0.65  AORTA Ao Root diam: 3.30 cm Ao Asc diam:  3.50 cm TRICUSPID VALVE TR Peak grad:   20.4 mmHg TR Vmax:        226.00 cm/s   SHUNTS Systemic VTI:  0.17 m Systemic Diam: 2.00 cm Weston Brass MD Electronically signed by Weston Brass MD Signature Date/Time: 03/30/2020/4:02:09 PM    Final     Microbiology: Recent Results (from the past 240 hour(s))  Urine culture     Status: Abnormal   Collection Time: 03/28/20  9:18 PM   Specimen: Urine, Random  Result Value Ref Range Status   Specimen Description URINE, RANDOM  Final   Special Requests   Final    NONE Performed at [facility], [address], [place], [place] [address]    Culture >=100,000 COLONIES/mL KLEBSIELLA PNEUMONIAE (A)  Final   Report Status 03/30/2020 FINAL  Final   Organism ID, Bacteria KLEBSIELLA PNEUMONIAE (A)  Final      Susceptibility   Klebsiella pneumoniae - MIC*    AMPICILLIN RESISTANT Resistant     CEFAZOLIN <=4 SENSITIVE Sensitive     CEFTRIAXONE <=0.25 SENSITIVE Sensitive     CIPROFLOXACIN <=0.25 SENSITIVE Sensitive     GENTAMICIN <=1 SENSITIVE Sensitive     IMIPENEM <=0.25 SENSITIVE Sensitive     NITROFURANTOIN <=16 SENSITIVE Sensitive     TRIMETH/SULFA <=20 SENSITIVE Sensitive     AMPICILLIN/SULBACTAM <=2 SENSITIVE Sensitive     PIP/TAZO <=4 SENSITIVE Sensitive     * >=100,000 COLONIES/mL KLEBSIELLA PNEUMONIAE  Respiratory Panel by RT PCR (Flu A&B, Covid) - Nasopharyngeal Swab     Status: None   Collection  Time: 03/29/20  4:48 AM   Specimen: Nasopharyngeal Swab  Result Value Ref Range Status   SARS Coronavirus 2 by RT PCR NEGATIVE NEGATIVE Final    Comment: (NOTE) SARS-CoV-2 target nucleic acids are NOT DETECTED.  The SARS-CoV-2 RNA is generally detectable in upper respiratoy specimens during the acute phase of infection. The lowest concentration of SARS-CoV-2 viral copies this assay can detect is 131 copies/mL. A negative result does not preclude SARS-Cov-2 infection and should not be used as the sole basis for treatment or other patient management decisions. A negative result may occur with  improper specimen  collection/handling, submission of specimen other than nasopharyngeal swab, presence of viral mutation(s) within the areas targeted by this assay, and inadequate number of viral copies (<131 copies/mL). A negative result must be combined with clinical observations, patient history, and epidemiological information. The expected result is Negative.  Fact Sheet for Patients:  https://www.moore.com/  Fact Sheet for Healthcare Providers:  https://www.young.biz/  This test is no t yet approved or cleared by the [place] FDA and  has been authorized for detection and/or diagnosis of SARS-CoV-2 by FDA under an Emergency Use Authorization (EUA). This EUA will remain  in effect (meaning this test can be used) for the duration of the COVID-19 declaration under Section 564(b)(1) of the Act, 21 U.S.C. section 360bbb-3(b)(1), unless the authorization is terminated or revoked sooner.     Influenza A by PCR NEGATIVE NEGATIVE Final   Influenza B by PCR NEGATIVE NEGATIVE Final    Comment: (NOTE) The Xpert Xpress SARS-CoV-2/FLU/RSV assay is intended as an aid in  the diagnosis of influenza from Nasopharyngeal swab specimens and  should not be used as a sole basis for treatment. Nasal washings and  aspirates are unacceptable for Xpert Xpress SARS-CoV-2/FLU/RSV  testing.  Fact Sheet for Patients: https://www.moore.com/  Fact Sheet for Healthcare Providers: https://www.young.biz/  This test is not yet approved or cleared by the [place] FDA and  has been authorized for detection and/or diagnosis of SARS-CoV-2 by  FDA under an Emergency Use Authorization (EUA). This EUA will remain  in effect (meaning this test can be used) for the duration of the  Covid-19 declaration under Section 564(b)(1) of the Act, 21  U.S.C. section 360bbb-3(b)(1), unless the authorization is  terminated or revoked. Performed at [facility], [address], [place], [place] [address]   Culture, blood (routine x 2) Call MD if unable to obtain prior to antibiotics being given     Status: None   Collection Time: 03/29/20  5:32 PM   Specimen: BLOOD RIGHT ARM  Result Value Ref Range Status   Specimen Description BLOOD RIGHT ARM  Final   Special Requests   Final    BOTTLES DRAWN AEROBIC AND ANAEROBIC Blood Culture adequate volume   Culture   Final    NO GROWTH 5 DAYS Performed at [facility], [address] [place], [place] [address]    Report Status 04/03/2020 FINAL  Final  Culture, blood (routine x 2) Call MD if unable to obtain prior to antibiotics being given     Status: None   Collection Time: 03/29/20  5:32 PM   Specimen: BLOOD LEFT ARM  Result Value Ref Range Status   Specimen Description BLOOD LEFT ARM  Final   Special Requests   Final    BOTTLES DRAWN AEROBIC AND ANAEROBIC Blood Culture adequate volume   Culture   Final    NO GROWTH 5 DAYS Performed  at [facility], [address] [place], [place] [address]    Report Status 04/03/2020 FINAL  Final  SARS Coronavirus 2 by RT PCR (hospital order, performed in [facility] hospital lab) Nasopharyngeal Nasopharyngeal Swab     Status: None   Collection Time: 04/03/20  3:25 PM   Specimen: Nasopharyngeal Swab  Result Value Ref Range Status   SARS Coronavirus 2 NEGATIVE NEGATIVE Final    Comment: (NOTE) SARS-CoV-2 target nucleic acids are NOT DETECTED.  The SARS-CoV-2 RNA is generally detectable in upper and lower respiratory specimens during the acute phase of infection. The lowest concentration of SARS-CoV-2 viral copies this assay can detect is 250 copies / mL. A negative result does not preclude SARS-CoV-2 infection and should not be used as the sole basis for treatment or other patient management decisions.  A negative result may occur with improper specimen collection / handling, submission of specimen other than nasopharyngeal swab,  presence of viral mutation(s) within the areas targeted by this assay, and inadequate number of viral copies (<250 copies / mL). A negative result must be combined with clinical observations, patient history, and epidemiological information.  Fact Sheet for Patients:   BoilerBrush.com.cyhttps://www.fda.gov/media/136312/download  Fact Sheet for Healthcare Providers: https://pope.com/https://www.fda.gov/media/136313/download  This test is not yet approved or  cleared by the [place] FDA and has been authorized for detection and/or diagnosis of SARS-CoV-2 by FDA under an Emergency Use Authorization (EUA).  This EUA will remain in effect (meaning this test can be used) for the duration of the COVID-19 declaration under Section 564(b)(1) of the Act, 21 U.S.C. section 360bbb-3(b)(1), unless the authorization is terminated or revoked sooner.  Performed at [facility], [address] [place], [place] [address]      Labs: Basic Metabolic Panel: Recent Labs  Lab 03/28/20 1641 03/29/20 0926 03/30/20 0348  NA 141 139 137  K 3.3* 3.5 3.8  CL 106 104 105  CO2 23 26 26   GLUCOSE 128* 139* 105*  BUN 24* 15 16  CREATININE 0.84 0.55 0.63  CALCIUM 9.2 9.1 8.9  MG  --  1.9  --    Liver Function Tests: No results for input(s): AST, ALT, ALKPHOS, BILITOT, PROT, ALBUMIN in the last 168 hours. No results for input(s): LIPASE, AMYLASE in the last 168 hours. No results for input(s): AMMONIA in the last 168 hours. CBC: Recent Labs  Lab 03/28/20 1641 03/30/20 0348  WBC 5.0 5.2  HGB 11.9* 11.3*  HCT 37.9 35.1*  MCV 95.0 92.4  PLT 240 206   Cardiac Enzymes: No results for input(s): CKTOTAL, CKMB, CKMBINDEX, TROPONINI in the last 168 hours. BNP: BNP (last 3 results) No results for input(s): BNP in the last 8760 hours.  ProBNP (last 3 results) No results for input(s): PROBNP in the last 8760 hours.  CBG: Recent Labs  Lab 03/31/20 1651 04/01/20 0739 04/02/20 0437 04/03/20 0554 04/04/20 0633  GLUCAP  93 104* 94 124* 92    Principal Problem:   Syncope Active Problems:   HTN (hypertension)   Hypokalemia   Atrial fibrillation, chronic (HCC)   Time coordinating discharge: 38 minutes  Signed:  Dr Kathlen ModyAkula, Yasaman Kolek MD [facility]  04/04/2020, 10:02 AM

## 2020-04-04 NOTE — Plan of Care (Signed)

## 2020-04-04 NOTE — Progress Notes (Signed)
D/C instructions printed and placed in packet at nurse's station. Tele and IV removed, tolerated well.  

## 2020-04-04 NOTE — TOC Transition Note (Signed)
Transition of Care [facility]) - CM/SW Discharge Note   Patient Details  Name: Tammy Navarro MRN: [id number] Date of Birth: 01-May-1941  Transition of Care [facility]) CM/SW Contact:  Carmina Miller, LCSWA Phone Number: 04/04/2020, 10:03 AM   Clinical Narrative:    Patient will DC to: [facility]  Anticipated DC date: 04/04/20 Family notified: Abbie Sons Transport by: Sharin Mons   Per MD patient ready for DC to [facility]. RN to call report prior to discharge [phone]. RN, patient, patient's family, and facility notified of DC. Discharge Summary and FL2 sent to facility. DC packet on chart. Ambulance transport requested for patient.   CSW will sign off for now as social work intervention is no longer needed. Please consult [place] again if new needs arise.     Final next level of care: Skilled Nursing Facility Barriers to Discharge: Barriers Resolved   Patient Goals and CMS Choice Patient states their goals for this hospitalization and ongoing recovery are:: Get back home CMS Medicare.gov Compare Post Acute Care list provided to:: Patient Represenative (must comment) Onalee Hua, son) Choice offered to / list presented to : Adult Children  Discharge Placement PASRR number recieved: 10/27/18            Patient chooses bed at: [facility] Patient to be transferred to facility by: [facility] Name of family member notified: Onalee Hua Patient and family notified of of transfer: 04/04/20  Discharge Plan and Services                                     Social Determinants of Health (SDOH) Interventions     Readmission Risk Interventions No flowsheet data found.

## 2020-04-04 NOTE — Progress Notes (Signed)
Called [facility] and spoke with [facility]. She requested to leave a call back number for the nurse. Assigned number was left ([phone]). Will give report when nurse calls back. [facility] scheduled for Noon.

## 2020-07-29 IMAGING — CT CT MAXILLOFACIAL WITHOUT CONTRAST
3 series · 11 of 47 positions shown, 13 images · non-contrast
Comparison: None.
COMPARISON: None.

Addendum:
CLINICAL DATA: 77-year-old female with unwitnessed fall. Found in
parking lot. Confused. Blunt trauma.

EXAM:
CT HEAD WITHOUT CONTRAST
CT MAXILLOFACIAL WITHOUT CONTRAST
CT CERVICAL SPINE WITHOUT CONTRAST
TECHNIQUE: Multidetector CT imaging of the head, cervical spine, and
maxillofacial structures were performed using the standard protocol
without intravenous contrast. Multiplanar CT image reconstructions
of the cervical spine and maxillofacial structures were also
generated.

[Series 4: facial/ orbits 2.0 h30s · axial · 0.36mm/px · z∈[-165,-27]mm · 5 of 89 slices shown, 7 images]
[im 10/89  brain]
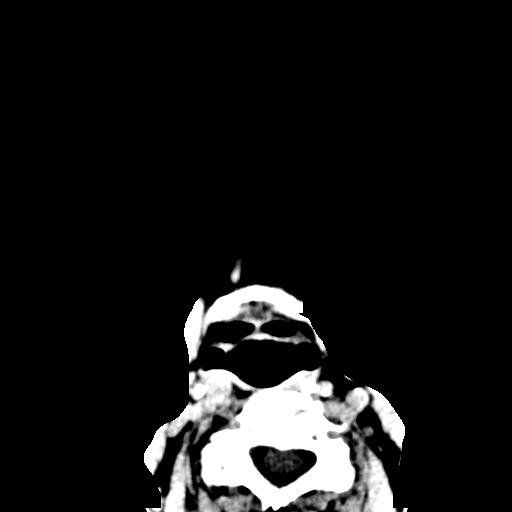
[im 10/89  bone]
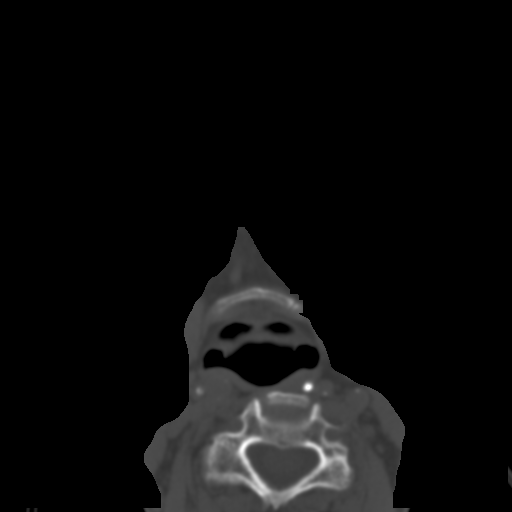
[im 28/89  bone]
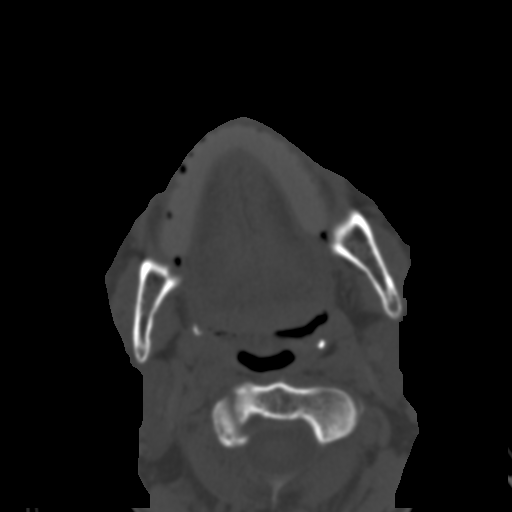
[im 46/89  bone]
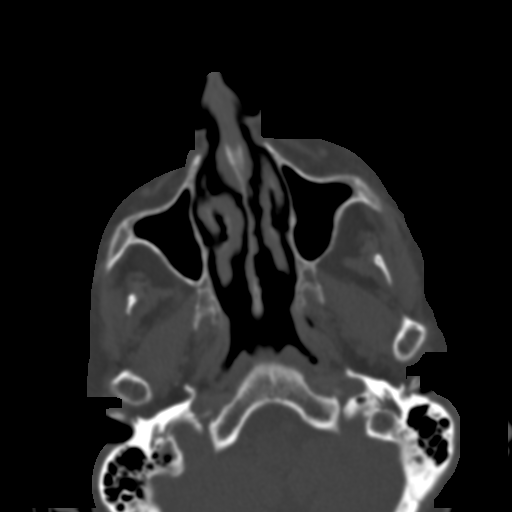
[im 61/89  bone]
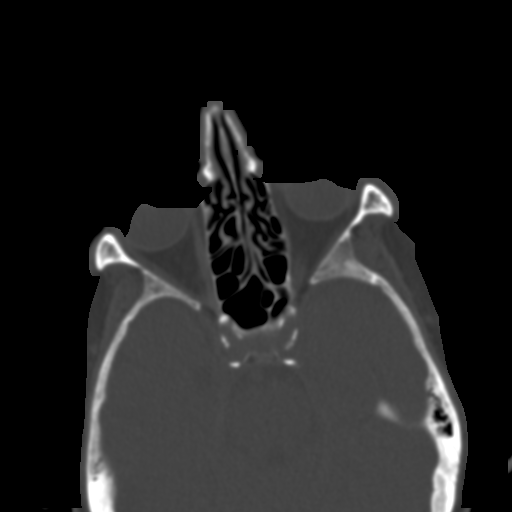
[im 79/89  brain]
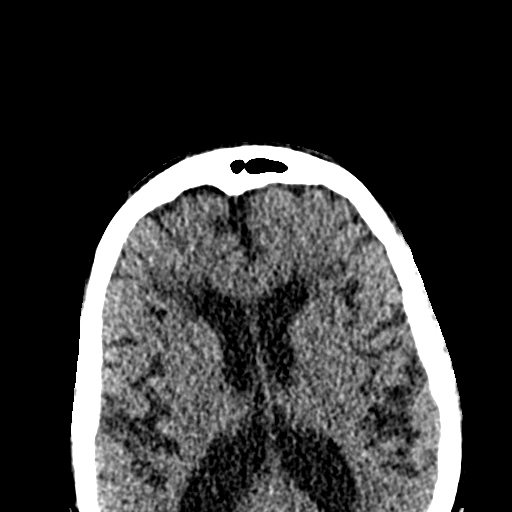
[im 79/89  bone]
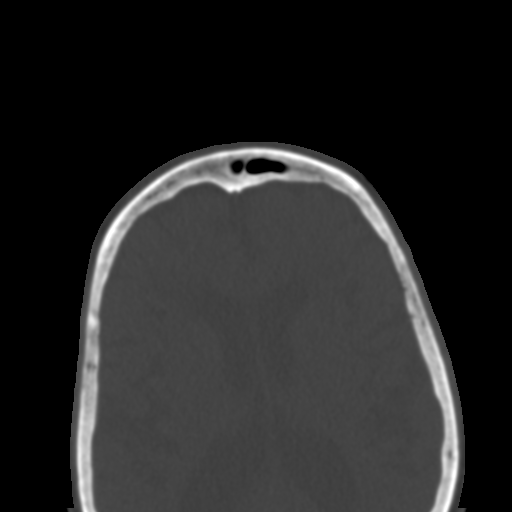

[Series 7: coronal soft tissue · coronal · 0.34mm/px · 3 of 76 slices shown]
[im 26/76  bone]
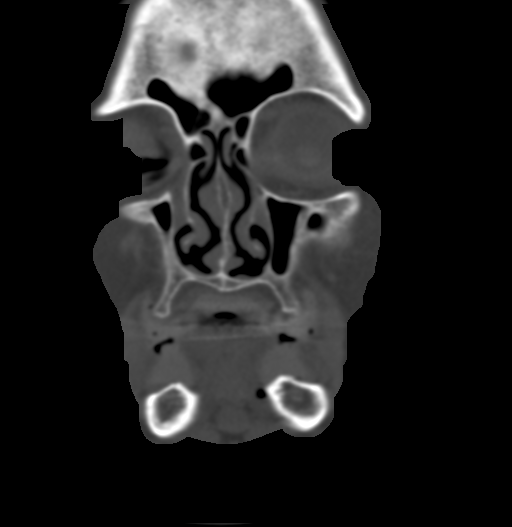
[im 34/76  bone]
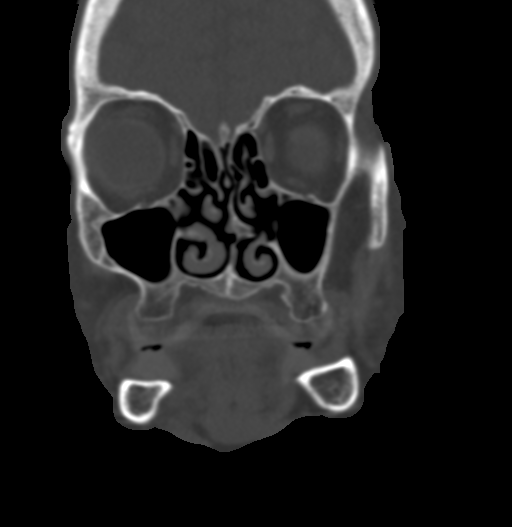
[im 42/76  bone]
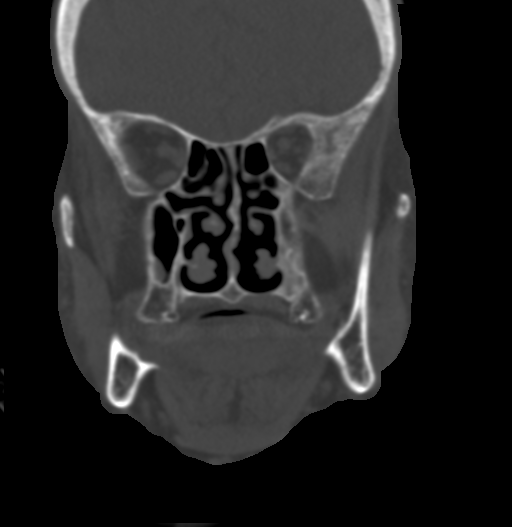

[Series 8: sagittal soft tissue · sagittal · 0.35mm/px · 3 of 76 slices shown]
[im 26/76  bone]
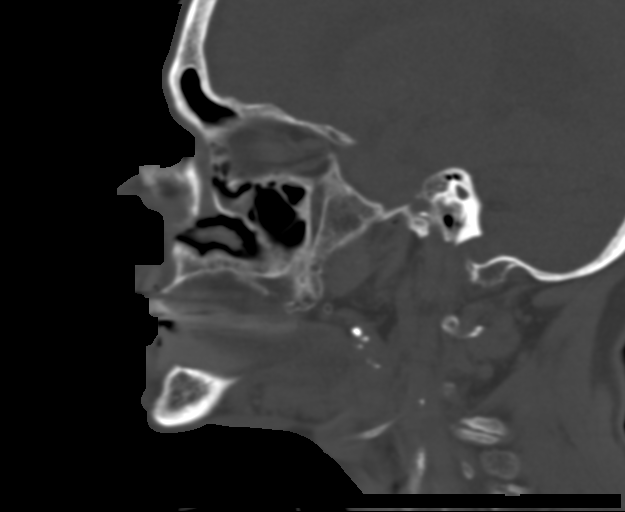
[im 38/76  bone]
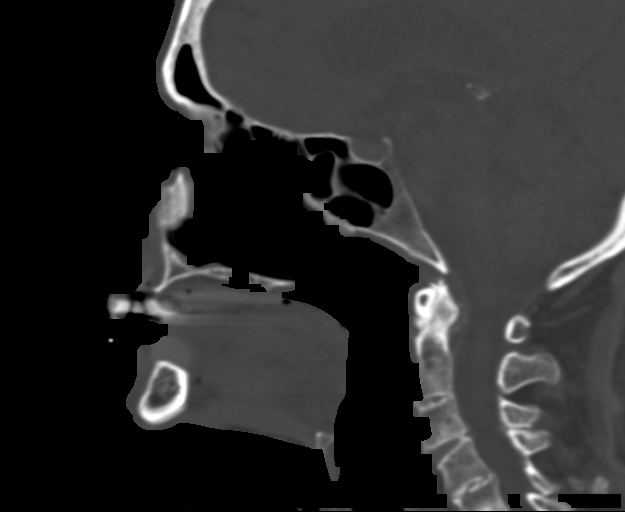
[im 51/76  bone]
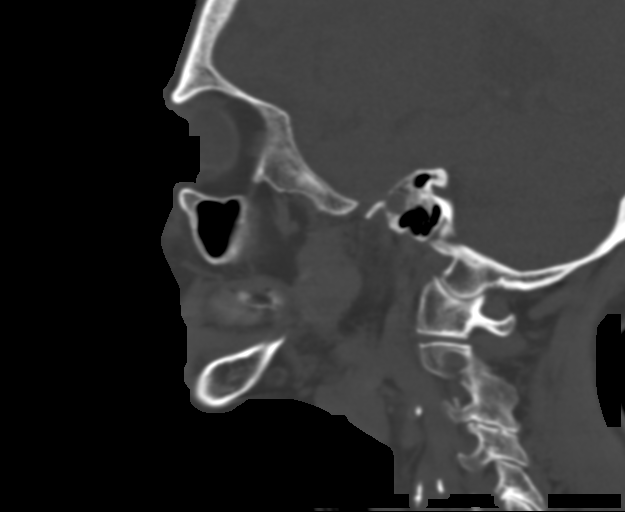

[11 of 47 positions shown; findings below may reference images not displayed]

FINDINGS: CT HEAD FINDINGS

Brain: Small volume subarachnoid hemorrhage over the superolateral
hemispheres left greater than right (series 5, image 23). No basilar
cistern or intraventricular blood. No subdural or other extra-axial
blood identified.

No intracranial mass effect. No ventriculomegaly.

Patchy and confluent bilateral cerebral white matter hypodensity,
but abnormal gray matter hypodensity in the right inferior temporal
gyrus (series 5, image 12 and coronal image 39. No significant mass
effect. The mesial right temporal lobe seems to remain normal. No
intra-axial blood identified.

Dystrophic basal ganglia calcifications.

Vascular: Calcified atherosclerosis at the skull base. No suspicious
intracranial vascular hyperdensity.

Skull: No calvarium fracture identified.

Other: Superficial left periorbital hematoma described below. Other
scalp soft tissues are within normal limits.

CT MAXILLOFACIAL FINDINGS

Osseous: Largely absent dentition. Mandible intact. No maxilla,
zygoma, or nasal bone fracture identified. Central skull base
appears intact.

Orbits: Intact orbital walls. Left superior periorbital superficial
scalp hematoma measuring up to 10 millimeters in thickness.
Underlying left frontal bone intact. Globes and intraorbital soft
tissues appear normal.

Sinuses: Well pneumatized. Tympanic cavities and mastoids are clear.
Negative nasal cavity aside from leftward septal deviation and trace
retained secretions on the right.

Soft tissues: Calcified carotid atherosclerosis in the neck.
Partially retropharyngeal course of the right carotid. Superimposed
postinflammatory calcifications of the palatine tonsils. Negative
parapharyngeal spaces, retropharyngeal space, sublingual space,
submandibular, parotid, and masticator spaces. No other superficial
soft tissue injury identified. No upper cervical lymphadenopathy.

CT CERVICAL SPINE FINDINGS

Alignment: Degenerative appearing mild retrolisthesis of C5 on C6
with ankylosis at C4-C5 and also C2-C3. Straightening of lordosis
otherwise. Posterior element alignment remains normal.

Skull base and vertebrae: Visualized skull base is intact. No
atlanto-occipital dissociation. Osteopenia. No acute osseous
abnormality identified.

Soft tissues and spinal canal: No prevertebral fluid or swelling. No
visible canal hematoma. Calcified carotid atherosclerosis in the
neck as stated above.

Disc levels: Advanced C1-C2 degeneration including partially
calcified ligamentous hypertrophy. Ankylosis of C2-C3 posterior
elements on the left and C4-C5 vertebral bodies and right posterior
elements. Advanced disc and endplate degeneration at C5-C6 with
superimposed mild retrolisthesis. Mild to moderate spinal stenosis
at that level.

Upper chest: Grossly intact visible upper thoracic levels. Negative
visible lung apices.
IMPRESSION: 1. Nonspecific abnormal hypodensity in the right inferior temporal
gyrus. Differential considerations include infarct, infection,
encephalomalacia, and less likely tumor related edema.
Recommend follow-up MRI brain without and with contrast to further
characterize.
2. Superimposed Small volume posttraumatic subarachnoid hemorrhage
over the convexities. No intracranial mass effect or
ventriculomegaly.
3. No other acute traumatic injury to the brain identified. No
fracture of the skull, face, or cervical spine identified.
4. Left periorbital superficial scalp hematoma.
5. Cervical spine degeneration and ankylosis with mild to moderate
degenerative spinal stenosis at C5-C6.

ADDENDUM:
Study discussed by telephone with JUCELMO UTZIG on 10/24/2018 at

*** End of Addendum ***
FINDINGS: CT HEAD FINDINGS

Brain: Small volume subarachnoid hemorrhage over the superolateral
hemispheres left greater than right (series 5, image 23). No basilar
cistern or intraventricular blood. No subdural or other extra-axial
blood identified.

No intracranial mass effect. No ventriculomegaly.

Patchy and confluent bilateral cerebral white matter hypodensity,
but abnormal gray matter hypodensity in the right inferior temporal
gyrus (series 5, image 12 and coronal image 39. No significant mass
effect. The mesial right temporal lobe seems to remain normal. No
intra-axial blood identified.

Dystrophic basal ganglia calcifications.

Vascular: Calcified atherosclerosis at the skull base. No suspicious
intracranial vascular hyperdensity.

Skull: No calvarium fracture identified.

Other: Superficial left periorbital hematoma described below. Other
scalp soft tissues are within normal limits.

CT MAXILLOFACIAL FINDINGS

Osseous: Largely absent dentition. Mandible intact. No maxilla,
zygoma, or nasal bone fracture identified. Central skull base
appears intact.

Orbits: Intact orbital walls. Left superior periorbital superficial
scalp hematoma measuring up to 10 millimeters in thickness.
Underlying left frontal bone intact. Globes and intraorbital soft
tissues appear normal.

Sinuses: Well pneumatized. Tympanic cavities and mastoids are clear.
Negative nasal cavity aside from leftward septal deviation and trace
retained secretions on the right.

Soft tissues: Calcified carotid atherosclerosis in the neck.
Partially retropharyngeal course of the right carotid. Superimposed
postinflammatory calcifications of the palatine tonsils. Negative
parapharyngeal spaces, retropharyngeal space, sublingual space,
submandibular, parotid, and masticator spaces. No other superficial
soft tissue injury identified. No upper cervical lymphadenopathy.

CT CERVICAL SPINE FINDINGS

Alignment: Degenerative appearing mild retrolisthesis of C5 on C6
with ankylosis at C4-C5 and also C2-C3. Straightening of lordosis
otherwise. Posterior element alignment remains normal.

Skull base and vertebrae: Visualized skull base is intact. No
atlanto-occipital dissociation. Osteopenia. No acute osseous
abnormality identified.

Soft tissues and spinal canal: No prevertebral fluid or swelling. No
visible canal hematoma. Calcified carotid atherosclerosis in the
neck as stated above.

Disc levels: Advanced C1-C2 degeneration including partially
calcified ligamentous hypertrophy. Ankylosis of C2-C3 posterior
elements on the left and C4-C5 vertebral bodies and right posterior
elements. Advanced disc and endplate degeneration at C5-C6 with
superimposed mild retrolisthesis. Mild to moderate spinal stenosis
at that level.

Upper chest: Grossly intact visible upper thoracic levels. Negative
visible lung apices.
IMPRESSION: 1. Nonspecific abnormal hypodensity in the right inferior temporal
gyrus. Differential considerations include infarct, infection,
encephalomalacia, and less likely tumor related edema.
Recommend follow-up MRI brain without and with contrast to further
characterize.
2. Superimposed Small volume posttraumatic subarachnoid hemorrhage
over the convexities. No intracranial mass effect or
ventriculomegaly.
3. No other acute traumatic injury to the brain identified. No
fracture of the skull, face, or cervical spine identified.
4. Left periorbital superficial scalp hematoma.
5. Cervical spine degeneration and ankylosis with mild to moderate
degenerative spinal stenosis at C5-C6.

## 2020-07-29 IMAGING — CT CT CERVICAL SPINE WITHOUT CONTRAST
3 of 4 series · 9 of 33 positions shown, 10 images · non-contrast
Comparison: None.
COMPARISON: None.

Addendum:
CLINICAL DATA: 77-year-old female with unwitnessed fall. Found in
parking lot. Confused. Blunt trauma.

EXAM:
CT HEAD WITHOUT CONTRAST
CT MAXILLOFACIAL WITHOUT CONTRAST
CT CERVICAL SPINE WITHOUT CONTRAST
TECHNIQUE: Multidetector CT imaging of the head, cervical spine, and
maxillofacial structures were performed using the standard protocol
without intravenous contrast. Multiplanar CT image reconstructions
of the cervical spine and maxillofacial structures were also
generated.

[Series 4: c_spine 2.0 3 st · axial · 0.31mm/px · z∈[-132,-132]mm · 1 of 82 slices shown, 2 images]
[im 47/82  soft-tissue]
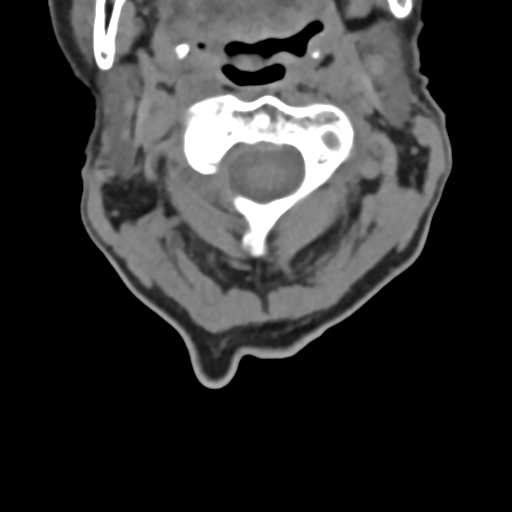
[im 47/82  bone]
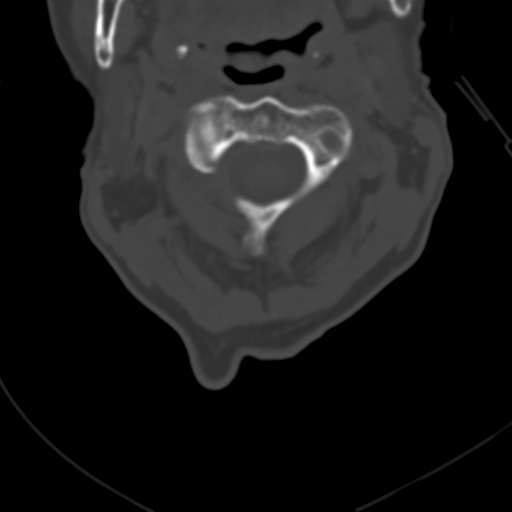

[Series 6: coronal bone · coronal · 0.24mm/px · 3 of 65 slices shown]
[im 13/65  bone]
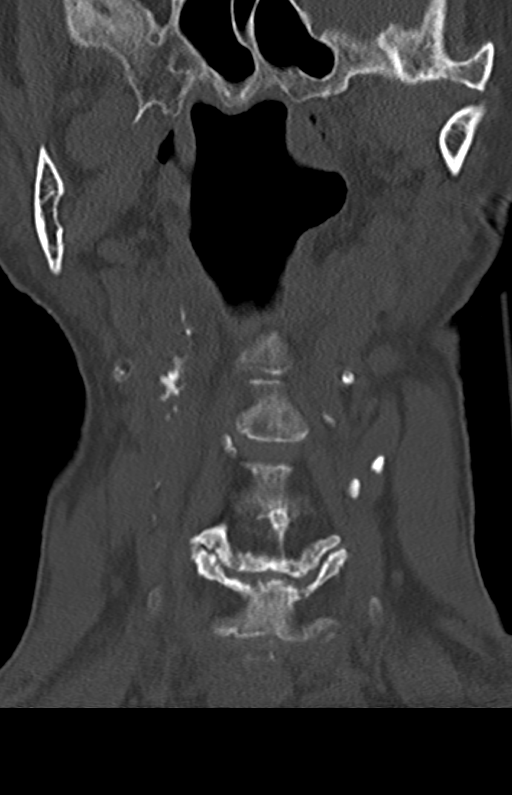
[im 26/65  bone]
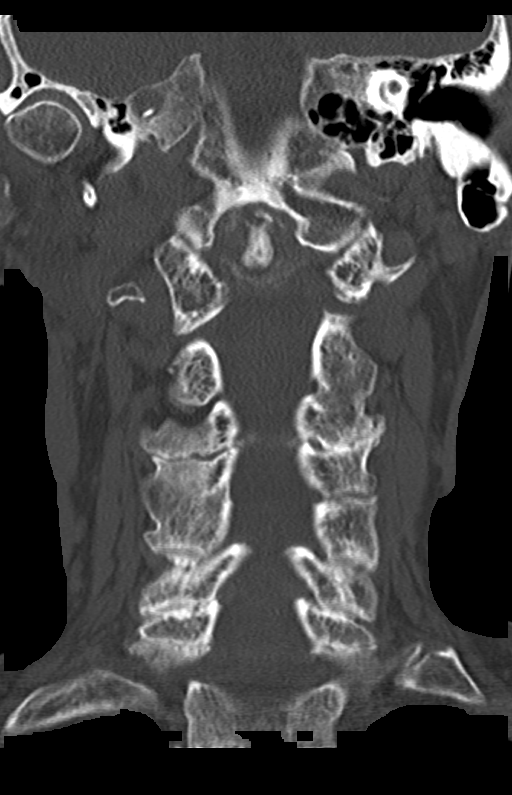
[im 39/65  bone]
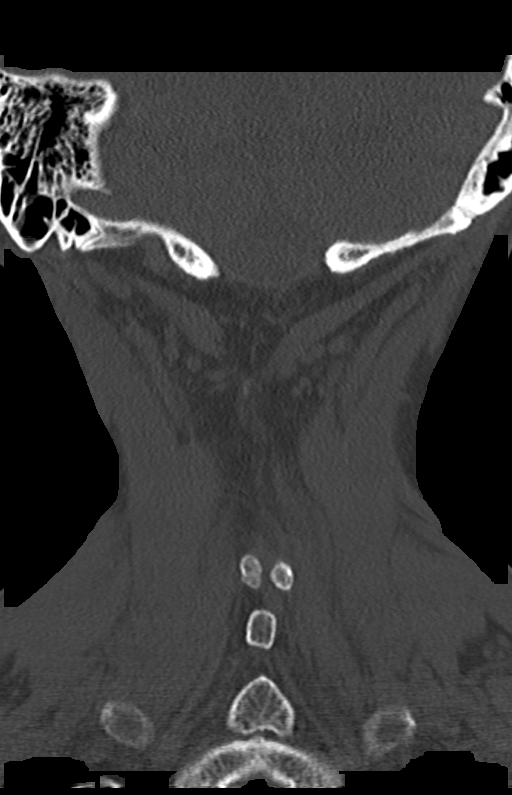

[Series 7: sagittal bone · sagittal · 0.24mm/px · 5 of 61 slices shown]
[im 21/61  bone]
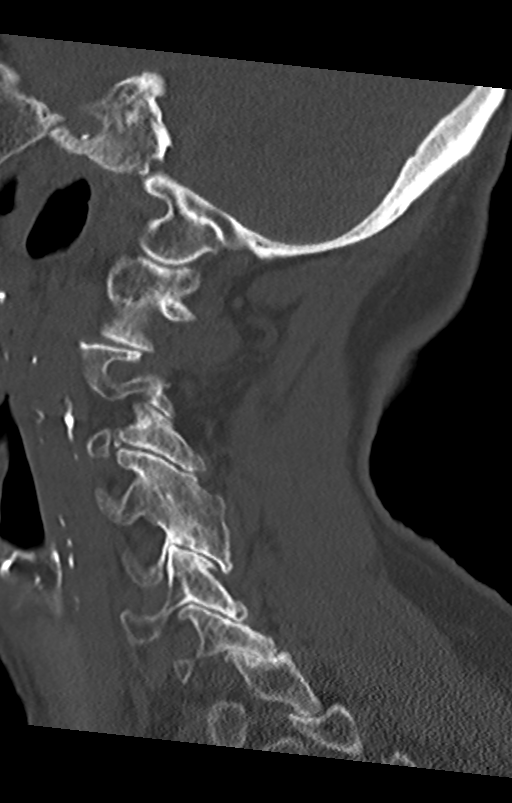
[im 26/61  bone]
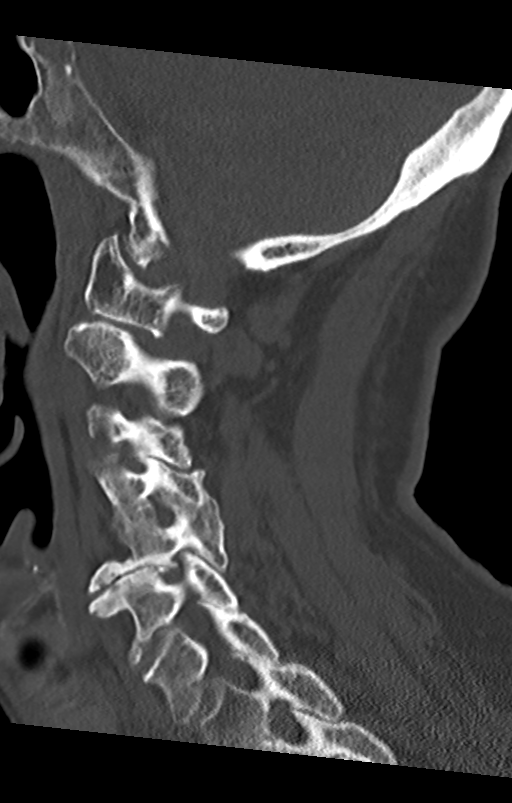
[im 31/61  bone]
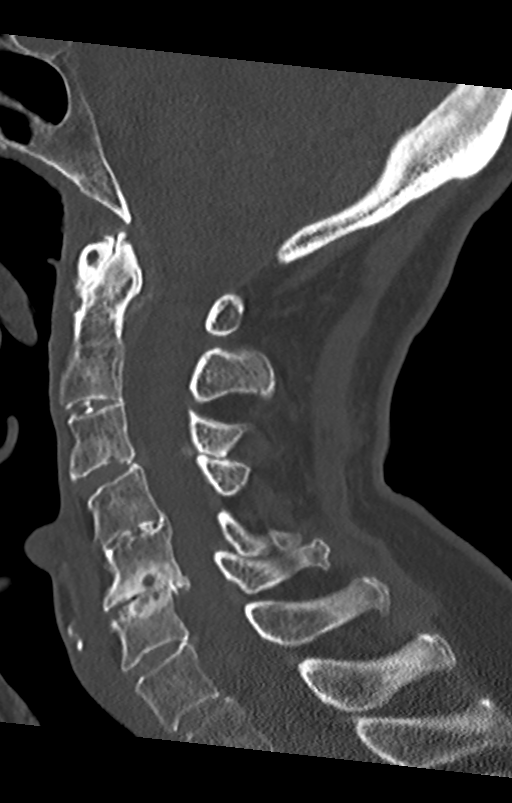
[im 36/61  bone]
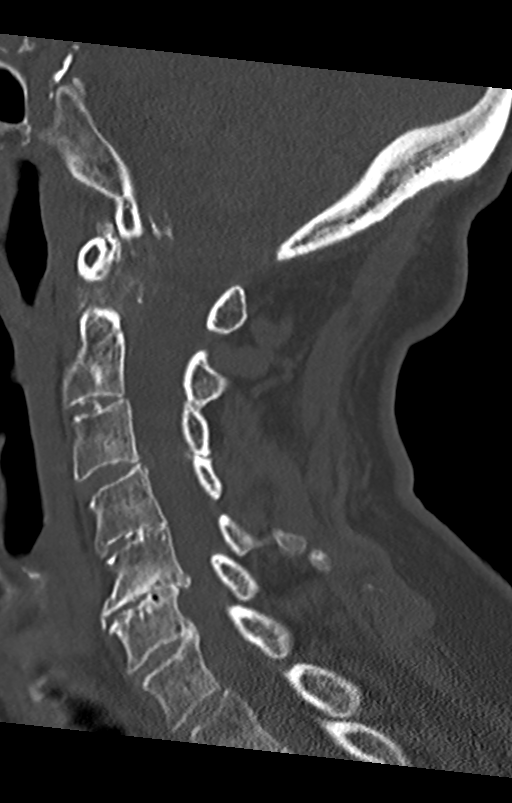
[im 41/61  bone]
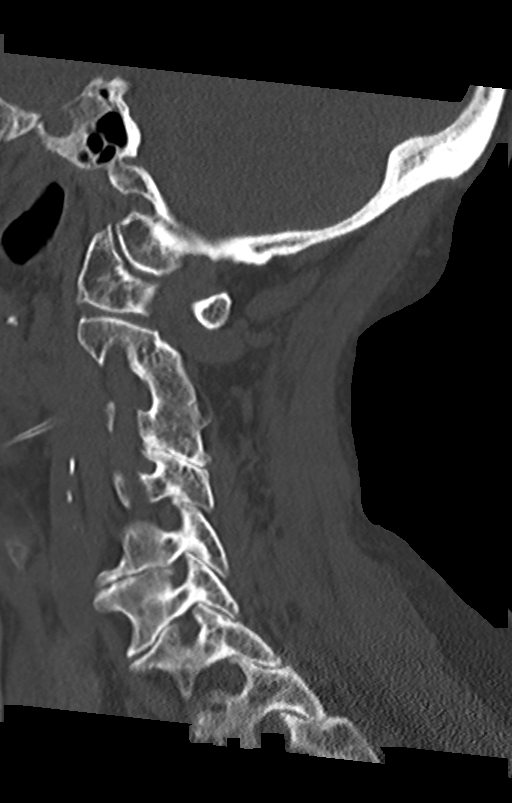

[9 of 33 positions shown; findings below may reference images not displayed]

FINDINGS: CT HEAD FINDINGS

Brain: Small volume subarachnoid hemorrhage over the superolateral
hemispheres left greater than right (series 5, image 23). No basilar
cistern or intraventricular blood. No subdural or other extra-axial
blood identified.

No intracranial mass effect. No ventriculomegaly.

Patchy and confluent bilateral cerebral white matter hypodensity,
but abnormal gray matter hypodensity in the right inferior temporal
gyrus (series 5, image 12 and coronal image 39. No significant mass
effect. The mesial right temporal lobe seems to remain normal. No
intra-axial blood identified.

Dystrophic basal ganglia calcifications.

Vascular: Calcified atherosclerosis at the skull base. No suspicious
intracranial vascular hyperdensity.

Skull: No calvarium fracture identified.

Other: Superficial left periorbital hematoma described below. Other
scalp soft tissues are within normal limits.

CT MAXILLOFACIAL FINDINGS

Osseous: Largely absent dentition. Mandible intact. No maxilla,
zygoma, or nasal bone fracture identified. Central skull base
appears intact.

Orbits: Intact orbital walls. Left superior periorbital superficial
scalp hematoma measuring up to 10 millimeters in thickness.
Underlying left frontal bone intact. Globes and intraorbital soft
tissues appear normal.

Sinuses: Well pneumatized. Tympanic cavities and mastoids are clear.
Negative nasal cavity aside from leftward septal deviation and trace
retained secretions on the right.

Soft tissues: Calcified carotid atherosclerosis in the neck.
Partially retropharyngeal course of the right carotid. Superimposed
postinflammatory calcifications of the palatine tonsils. Negative
parapharyngeal spaces, retropharyngeal space, sublingual space,
submandibular, parotid, and masticator spaces. No other superficial
soft tissue injury identified. No upper cervical lymphadenopathy.

CT CERVICAL SPINE FINDINGS

Alignment: Degenerative appearing mild retrolisthesis of C5 on C6
with ankylosis at C4-C5 and also C2-C3. Straightening of lordosis
otherwise. Posterior element alignment remains normal.

Skull base and vertebrae: Visualized skull base is intact. No
atlanto-occipital dissociation. Osteopenia. No acute osseous
abnormality identified.

Soft tissues and spinal canal: No prevertebral fluid or swelling. No
visible canal hematoma. Calcified carotid atherosclerosis in the
neck as stated above.

Disc levels: Advanced C1-C2 degeneration including partially
calcified ligamentous hypertrophy. Ankylosis of C2-C3 posterior
elements on the left and C4-C5 vertebral bodies and right posterior
elements. Advanced disc and endplate degeneration at C5-C6 with
superimposed mild retrolisthesis. Mild to moderate spinal stenosis
at that level.

Upper chest: Grossly intact visible upper thoracic levels. Negative
visible lung apices.
IMPRESSION: 1. Nonspecific abnormal hypodensity in the right inferior temporal
gyrus. Differential considerations include infarct, infection,
encephalomalacia, and less likely tumor related edema.
Recommend follow-up MRI brain without and with contrast to further
characterize.
2. Superimposed Small volume posttraumatic subarachnoid hemorrhage
over the convexities. No intracranial mass effect or
ventriculomegaly.
3. No other acute traumatic injury to the brain identified. No
fracture of the skull, face, or cervical spine identified.
4. Left periorbital superficial scalp hematoma.
5. Cervical spine degeneration and ankylosis with mild to moderate
degenerative spinal stenosis at C5-C6.

ADDENDUM:
Study discussed by telephone with JUCELMO UTZIG on 10/24/2018 at

*** End of Addendum ***
FINDINGS: CT HEAD FINDINGS

Brain: Small volume subarachnoid hemorrhage over the superolateral
hemispheres left greater than right (series 5, image 23). No basilar
cistern or intraventricular blood. No subdural or other extra-axial
blood identified.

No intracranial mass effect. No ventriculomegaly.

Patchy and confluent bilateral cerebral white matter hypodensity,
but abnormal gray matter hypodensity in the right inferior temporal
gyrus (series 5, image 12 and coronal image 39. No significant mass
effect. The mesial right temporal lobe seems to remain normal. No
intra-axial blood identified.

Dystrophic basal ganglia calcifications.

Vascular: Calcified atherosclerosis at the skull base. No suspicious
intracranial vascular hyperdensity.

Skull: No calvarium fracture identified.

Other: Superficial left periorbital hematoma described below. Other
scalp soft tissues are within normal limits.

CT MAXILLOFACIAL FINDINGS

Osseous: Largely absent dentition. Mandible intact. No maxilla,
zygoma, or nasal bone fracture identified. Central skull base
appears intact.

Orbits: Intact orbital walls. Left superior periorbital superficial
scalp hematoma measuring up to 10 millimeters in thickness.
Underlying left frontal bone intact. Globes and intraorbital soft
tissues appear normal.

Sinuses: Well pneumatized. Tympanic cavities and mastoids are clear.
Negative nasal cavity aside from leftward septal deviation and trace
retained secretions on the right.

Soft tissues: Calcified carotid atherosclerosis in the neck.
Partially retropharyngeal course of the right carotid. Superimposed
postinflammatory calcifications of the palatine tonsils. Negative
parapharyngeal spaces, retropharyngeal space, sublingual space,
submandibular, parotid, and masticator spaces. No other superficial
soft tissue injury identified. No upper cervical lymphadenopathy.

CT CERVICAL SPINE FINDINGS

Alignment: Degenerative appearing mild retrolisthesis of C5 on C6
with ankylosis at C4-C5 and also C2-C3. Straightening of lordosis
otherwise. Posterior element alignment remains normal.

Skull base and vertebrae: Visualized skull base is intact. No
atlanto-occipital dissociation. Osteopenia. No acute osseous
abnormality identified.

Soft tissues and spinal canal: No prevertebral fluid or swelling. No
visible canal hematoma. Calcified carotid atherosclerosis in the
neck as stated above.

Disc levels: Advanced C1-C2 degeneration including partially
calcified ligamentous hypertrophy. Ankylosis of C2-C3 posterior
elements on the left and C4-C5 vertebral bodies and right posterior
elements. Advanced disc and endplate degeneration at C5-C6 with
superimposed mild retrolisthesis. Mild to moderate spinal stenosis
at that level.

Upper chest: Grossly intact visible upper thoracic levels. Negative
visible lung apices.
IMPRESSION: 1. Nonspecific abnormal hypodensity in the right inferior temporal
gyrus. Differential considerations include infarct, infection,
encephalomalacia, and less likely tumor related edema.
Recommend follow-up MRI brain without and with contrast to further
characterize.
2. Superimposed Small volume posttraumatic subarachnoid hemorrhage
over the convexities. No intracranial mass effect or
ventriculomegaly.
3. No other acute traumatic injury to the brain identified. No
fracture of the skull, face, or cervical spine identified.
4. Left periorbital superficial scalp hematoma.
5. Cervical spine degeneration and ankylosis with mild to moderate
degenerative spinal stenosis at C5-C6.

## 2021-02-14 DEATH — deceased
